# Patient Record
Sex: Female | Born: 1975
Health system: Southern US, Community
[De-identification: ages and names within clinical notes are randomized; demographics above are authoritative.]

## PROBLEM LIST (undated history)

## (undated) DIAGNOSIS — J302 Other seasonal allergic rhinitis: Secondary | ICD-10-CM

## (undated) DIAGNOSIS — I10 Essential (primary) hypertension: Secondary | ICD-10-CM

## (undated) DIAGNOSIS — F32A Depression, unspecified: Secondary | ICD-10-CM

## (undated) DIAGNOSIS — E039 Hypothyroidism, unspecified: Secondary | ICD-10-CM

## (undated) DIAGNOSIS — F419 Anxiety disorder, unspecified: Secondary | ICD-10-CM

## (undated) DIAGNOSIS — K219 Gastro-esophageal reflux disease without esophagitis: Secondary | ICD-10-CM

## (undated) DIAGNOSIS — F329 Major depressive disorder, single episode, unspecified: Secondary | ICD-10-CM

## (undated) DIAGNOSIS — M549 Dorsalgia, unspecified: Secondary | ICD-10-CM

## (undated) DIAGNOSIS — M2550 Pain in unspecified joint: Secondary | ICD-10-CM

## (undated) DIAGNOSIS — R609 Edema, unspecified: Secondary | ICD-10-CM

## (undated) DIAGNOSIS — R7303 Prediabetes: Secondary | ICD-10-CM

## (undated) HISTORY — DX: Gastro-esophageal reflux disease without esophagitis: K21.9

## (undated) HISTORY — DX: Other seasonal allergic rhinitis: J30.2

## (undated) HISTORY — PX: COLONOSCOPY: SHX174

## (undated) HISTORY — DX: Depression, unspecified: F32.A

## (undated) HISTORY — DX: Hypothyroidism, unspecified: E03.9

## (undated) HISTORY — DX: Dorsalgia, unspecified: M54.9

## (undated) HISTORY — DX: Prediabetes: R73.03

## (undated) HISTORY — PX: NO PAST SURGERIES: SHX2092

## (undated) HISTORY — DX: Major depressive disorder, single episode, unspecified: F32.9

## (undated) HISTORY — DX: Edema, unspecified: R60.9

## (undated) HISTORY — DX: Essential (primary) hypertension: I10

## (undated) HISTORY — DX: Pain in unspecified joint: M25.50

## (undated) HISTORY — DX: Anxiety disorder, unspecified: F41.9

---

## 2007-04-24 ENCOUNTER — Emergency Department (HOSPITAL_COMMUNITY): Admission: EM | Admit: 2007-04-24 | Discharge: 2007-04-24 | Payer: Self-pay | Admitting: Family Medicine

## 2008-12-03 ENCOUNTER — Ambulatory Visit: Payer: Self-pay | Admitting: Family Medicine

## 2008-12-03 DIAGNOSIS — M25569 Pain in unspecified knee: Secondary | ICD-10-CM | POA: Insufficient documentation

## 2008-12-03 DIAGNOSIS — IMO0002 Reserved for concepts with insufficient information to code with codable children: Secondary | ICD-10-CM | POA: Insufficient documentation

## 2008-12-03 DIAGNOSIS — M7750 Other enthesopathy of unspecified foot: Secondary | ICD-10-CM | POA: Insufficient documentation

## 2008-12-31 ENCOUNTER — Ambulatory Visit: Payer: Self-pay | Admitting: Family Medicine

## 2009-04-24 ENCOUNTER — Emergency Department (HOSPITAL_COMMUNITY): Admission: EM | Admit: 2009-04-24 | Discharge: 2009-04-24 | Payer: Self-pay | Admitting: Family Medicine

## 2009-05-24 ENCOUNTER — Other Ambulatory Visit: Admission: RE | Admit: 2009-05-24 | Discharge: 2009-05-24 | Payer: Self-pay | Admitting: Family Medicine

## 2010-08-17 ENCOUNTER — Other Ambulatory Visit: Payer: Self-pay | Admitting: Family Medicine

## 2010-08-17 ENCOUNTER — Other Ambulatory Visit (HOSPITAL_COMMUNITY)
Admission: RE | Admit: 2010-08-17 | Discharge: 2010-08-17 | Disposition: A | Discharge status: Home / Self Care | Payer: BC Managed Care – PPO | Source: Ambulatory Visit | Attending: Family Medicine | Admitting: Family Medicine

## 2010-08-17 DIAGNOSIS — Z124 Encounter for screening for malignant neoplasm of cervix: Secondary | ICD-10-CM | POA: Insufficient documentation

## 2010-08-17 DIAGNOSIS — Z113 Encounter for screening for infections with a predominantly sexual mode of transmission: Secondary | ICD-10-CM | POA: Insufficient documentation

## 2010-09-13 ENCOUNTER — Inpatient Hospital Stay (INDEPENDENT_AMBULATORY_CARE_PROVIDER_SITE_OTHER)
Admission: RE | Admit: 2010-09-13 | Discharge: 2010-09-13 | Disposition: A | Discharge status: Home / Self Care | Payer: BC Managed Care – PPO | Source: Ambulatory Visit | Attending: Emergency Medicine | Admitting: Emergency Medicine

## 2010-09-13 DIAGNOSIS — K5289 Other specified noninfective gastroenteritis and colitis: Secondary | ICD-10-CM

## 2010-09-13 LAB — POCT I-STAT, CHEM 8
Calcium, Ion: 1.17 mmol/L (ref 1.12–1.32)
Chloride: 101 mEq/L (ref 96–112)
HCT: 42 % (ref 36.0–46.0)
Sodium: 140 mEq/L (ref 135–145)

## 2010-09-13 LAB — POCT PREGNANCY, URINE: Preg Test, Ur: NEGATIVE

## 2010-09-13 LAB — OCCULT BLOOD, POC DEVICE: Fecal Occult Bld: NEGATIVE

## 2010-09-13 LAB — POCT URINALYSIS DIP (DEVICE)
Glucose, UA: NEGATIVE mg/dL
Ketones, ur: 15 mg/dL — AB
Specific Gravity, Urine: 1.02 (ref 1.005–1.030)
Urobilinogen, UA: 2 mg/dL — ABNORMAL HIGH (ref 0.0–1.0)

## 2010-12-01 LAB — INFLUENZA A AND B ANTIGEN (CONVERTED LAB): Inflenza A Ag: NEGATIVE

## 2010-12-01 LAB — POCT RAPID STREP A: Streptococcus, Group A Screen (Direct): NEGATIVE

## 2012-10-20 ENCOUNTER — Other Ambulatory Visit: Payer: Self-pay | Admitting: *Deleted

## 2012-10-20 DIAGNOSIS — Z3490 Encounter for supervision of normal pregnancy, unspecified, unspecified trimester: Secondary | ICD-10-CM

## 2012-10-23 ENCOUNTER — Ambulatory Visit (HOSPITAL_COMMUNITY)
Admission: RE | Admit: 2012-10-23 | Discharge: 2012-10-23 | Disposition: A | Discharge status: Home / Self Care | Payer: BC Managed Care – PPO | Source: Ambulatory Visit | Attending: Obstetrics & Gynecology | Admitting: Obstetrics & Gynecology

## 2012-10-23 ENCOUNTER — Other Ambulatory Visit: Payer: BC Managed Care – PPO | Admitting: *Deleted

## 2012-10-23 DIAGNOSIS — O209 Hemorrhage in early pregnancy, unspecified: Secondary | ICD-10-CM | POA: Insufficient documentation

## 2012-10-23 DIAGNOSIS — Z3490 Encounter for supervision of normal pregnancy, unspecified, unspecified trimester: Secondary | ICD-10-CM

## 2012-10-23 DIAGNOSIS — Z3201 Encounter for pregnancy test, result positive: Secondary | ICD-10-CM

## 2012-10-23 LAB — OB RESULTS CONSOLE HGB/HCT, BLOOD: HCT: 36 %

## 2012-10-23 LAB — OB RESULTS CONSOLE RPR: RPR: NONREACTIVE

## 2012-10-24 ENCOUNTER — Encounter: Payer: Self-pay | Admitting: Obstetrics & Gynecology

## 2012-10-24 DIAGNOSIS — D259 Leiomyoma of uterus, unspecified: Secondary | ICD-10-CM | POA: Insufficient documentation

## 2012-10-24 DIAGNOSIS — IMO0002 Reserved for concepts with insufficient information to code with codable children: Secondary | ICD-10-CM | POA: Insufficient documentation

## 2012-10-24 LAB — OBSTETRIC PANEL
Eosinophils Absolute: 0 10*3/uL (ref 0.0–0.7)
HCT: 36.2 % (ref 36.0–46.0)
Hepatitis B Surface Ag: NEGATIVE
Lymphocytes Relative: 16 % (ref 12–46)
Lymphs Abs: 1.6 10*3/uL (ref 0.7–4.0)
MCHC: 33.4 g/dL (ref 30.0–36.0)
MCV: 87.9 fL (ref 78.0–100.0)
Monocytes Relative: 6 % (ref 3–12)
Neutrophils Relative %: 78 % — ABNORMAL HIGH (ref 43–77)
Platelets: 336 10*3/uL (ref 150–400)
Rh Type: POSITIVE
Rubella: 10.6 Index — ABNORMAL HIGH (ref <0.90)

## 2012-10-24 LAB — POCT PREGNANCY, URINE: Preg Test, Ur: POSITIVE — AB

## 2012-10-24 LAB — CULTURE, OB URINE
Colony Count: NO GROWTH
Organism ID, Bacteria: NO GROWTH

## 2012-10-27 LAB — HEMOGLOBINOPATHY EVALUATION
Hemoglobin Other: 0 %
Hgb S Quant: 0 %

## 2012-10-30 ENCOUNTER — Ambulatory Visit (INDEPENDENT_AMBULATORY_CARE_PROVIDER_SITE_OTHER): Payer: BC Managed Care – PPO | Admitting: Obstetrics & Gynecology

## 2012-10-30 ENCOUNTER — Encounter: Payer: Self-pay | Admitting: Obstetrics & Gynecology

## 2012-10-30 DIAGNOSIS — O99281 Endocrine, nutritional and metabolic diseases complicating pregnancy, first trimester: Secondary | ICD-10-CM

## 2012-10-30 DIAGNOSIS — O3410 Maternal care for benign tumor of corpus uteri, unspecified trimester: Secondary | ICD-10-CM

## 2012-10-30 DIAGNOSIS — O169 Unspecified maternal hypertension, unspecified trimester: Secondary | ICD-10-CM

## 2012-10-30 DIAGNOSIS — D259 Leiomyoma of uterus, unspecified: Secondary | ICD-10-CM

## 2012-10-30 DIAGNOSIS — O10019 Pre-existing essential hypertension complicating pregnancy, unspecified trimester: Secondary | ICD-10-CM

## 2012-10-30 DIAGNOSIS — O10911 Unspecified pre-existing hypertension complicating pregnancy, first trimester: Secondary | ICD-10-CM

## 2012-10-30 DIAGNOSIS — E079 Disorder of thyroid, unspecified: Secondary | ICD-10-CM

## 2012-10-30 DIAGNOSIS — O10919 Unspecified pre-existing hypertension complicating pregnancy, unspecified trimester: Secondary | ICD-10-CM | POA: Insufficient documentation

## 2012-10-30 DIAGNOSIS — O10011 Pre-existing essential hypertension complicating pregnancy, first trimester: Secondary | ICD-10-CM

## 2012-10-30 DIAGNOSIS — E039 Hypothyroidism, unspecified: Secondary | ICD-10-CM

## 2012-10-30 DIAGNOSIS — Z3491 Encounter for supervision of normal pregnancy, unspecified, first trimester: Secondary | ICD-10-CM

## 2012-10-30 LAB — POCT URINALYSIS DIP (DEVICE)
Bilirubin Urine: NEGATIVE
Ketones, ur: NEGATIVE mg/dL
Protein, ur: NEGATIVE mg/dL
Specific Gravity, Urine: 1.02 (ref 1.005–1.030)
pH: 8.5 — ABNORMAL HIGH (ref 5.0–8.0)

## 2012-10-30 MED ORDER — PRENATAL VITAMINS PLUS 27-1 MG PO TABS: 1.0000 | ORAL_TABLET | Freq: Every day | ORAL | Status: DC

## 2012-10-30 NOTE — Progress Notes (Addendum)
Nutrition note: 1st visit consult Pt has gained 3# @ [redacted]w[redacted]d, which is wnl.  Pt reports eating 4x/d. Pt is not taking PNV yet. Pt reports N/V daily in the morning and heartburn throughout the day. Pt is not physically active. Pt received verbal and written education on general nutrition during pregnancy. Discussed tips to decrease N/V and heartburn. Encouraged pt to take PNV daily. Discussed wt gain goals of 15-25# or 0.6#/wk during 2nd & 3rd trimester.  Pt agrees to start taking PNV soon. Pt does not have WIC. Pt plans to BF. F/u if referred Blondell Reveal, MS, RD, LDN

## 2012-10-30 NOTE — Patient Instructions (Signed)
Pregnancy - First Trimester  During sexual intercourse, millions of sperm go into the vagina. Only 1 sperm will penetrate and fertilize the female egg while it is in the Fallopian tube. One week later, the fertilized egg implants into the wall of the uterus. An embryo begins to develop into a baby. At 6 to 8 weeks, the eyes and face are formed and the heartbeat can be seen on ultrasound. At the end of 12 weeks (first trimester), all the baby's organs are formed. Now that you are pregnant, you will want to do everything you can to have a healthy baby. Two of the most important things are to get good prenatal care and follow your caregiver's instructions. Prenatal care is all the medical care you receive before the baby's birth. It is given to prevent, find, and treat problems during the pregnancy and childbirth.  PRENATAL EXAMS  · During prenatal visits, your weight, blood pressure, and urine are checked. This is done to make sure you are healthy and progressing normally during the pregnancy.  · A pregnant woman should gain 25 to 35 pounds during the pregnancy. However, if you are overweight or underweight, your caregiver will advise you regarding your weight.  · Your caregiver will ask and answer questions for you.  · Blood work, cervical cultures, other necessary tests, and a Pap test are done during your prenatal exams. These tests are done to check on your health and the probable health of your baby. Tests are strongly recommended and done for HIV with your permission. This is the virus that causes AIDS. These tests are done because medicines can be given to help prevent your baby from being born with this infection should you have been infected without knowing it. Blood work is also used to find out your blood type, previous infections, and follow your blood levels (hemoglobin).  · Low hemoglobin (anemia) is common during pregnancy. Iron and vitamins are given to help prevent this. Later in the pregnancy, blood  tests for diabetes will be done along with any other tests if any problems develop.  · You may need other tests to make sure you and the baby are doing well.  CHANGES DURING THE FIRST TRIMESTER   Your body goes through many changes during pregnancy. They vary from person to person. Talk to your caregiver about changes you notice and are concerned about. Changes can include:  · Your menstrual period stops.  · The egg and sperm carry the genes that determine what you look like. Genes from you and your partner are forming a baby. The female genes determine whether the baby is a boy or a girl.  · Your body increases in girth and you may feel bloated.  · Feeling sick to your stomach (nauseous) and throwing up (vomiting). If the vomiting is uncontrollable, call your caregiver.  · Your breasts will begin to enlarge and become tender.  · Your nipples may stick out more and become darker.  · The need to urinate more. Painful urination may mean you have a bladder infection.  · Tiring easily.  · Loss of appetite.  · Cravings for certain kinds of food.  · At first, you may gain or lose a couple of pounds.  · You may have changes in your emotions from day to day (excited to be pregnant or concerned something may go wrong with the pregnancy and baby).  · You may have more vivid and strange dreams.  HOME CARE INSTRUCTIONS   ·   It is very important to avoid all smoking, alcohol and non-prescribed drugs during your pregnancy. These affect the formation and growth of the baby. Avoid chemicals while pregnant to ensure the delivery of a healthy infant.  · Start your prenatal visits by the 12th week of pregnancy. They are usually scheduled monthly at first, then more often in the last 2 months before delivery. Keep your caregiver's appointments. Follow your caregiver's instructions regarding medicine use, blood and lab tests, exercise, and diet.  · During pregnancy, you are providing food for you and your baby. Eat regular, well-balanced  meals. Choose foods such as meat, fish, milk and other low fat dairy products, vegetables, fruits, and whole-grain breads and cereals. Your caregiver will tell you of the ideal weight gain.  · You can help morning sickness by keeping soda crackers at the bedside. Eat a couple before arising in the morning. You may want to use the crackers without salt on them.  · Eating 4 to 5 small meals rather than 3 large meals a day also may help the nausea and vomiting.  · Drinking liquids between meals instead of during meals also seems to help nausea and vomiting.  · A physical sexual relationship may be continued throughout pregnancy if there are no other problems. Problems may be early (premature) leaking of amniotic fluid from the membranes, vaginal bleeding, or belly (abdominal) pain.  · Exercise regularly if there are no restrictions. Check with your caregiver or physical therapist if you are unsure of the safety of some of your exercises. Greater weight gain will occur in the last 2 trimesters of pregnancy. Exercising will help:  · Control your weight.  · Keep you in shape.  · Prepare you for labor and delivery.  · Help you lose your pregnancy weight after you deliver your baby.  · Wear a good support or jogging bra for breast tenderness during pregnancy. This may help if worn during sleep too.  · Ask when prenatal classes are available. Begin classes when they are offered.  · Do not use hot tubs, steam rooms, or saunas.  · Wear your seat belt when driving. This protects you and your baby if you are in an accident.  · Avoid raw meat, uncooked cheese, cat litter boxes, and soil used by cats throughout the pregnancy. These carry germs that can cause birth defects in the baby.  · The first trimester is a good time to visit your dentist for your dental health. Getting your teeth cleaned is okay. Use a softer toothbrush and brush gently during pregnancy.  · Ask for help if you have financial, counseling, or nutritional needs  during pregnancy. Your caregiver will be able to offer counseling for these needs as well as refer you for other special needs.  · Do not take any medicines or herbs unless told by your caregiver.  · Inform your caregiver if there is any mental or physical domestic violence.  · Make a list of emergency phone numbers of family, friends, hospital, and police and fire departments.  · Write down your questions. Take them to your prenatal visit.  · Do not douche.  · Do not cross your legs.  · If you have to stand for long periods of time, rotate you feet or take small steps in a circle.  · You may have more vaginal secretions that may require a sanitary pad. Do not use tampons or scented sanitary pads.  MEDICINES AND DRUG USE IN PREGNANCY  ·   Take prenatal vitamins as directed. The vitamin should contain 1 milligram of folic acid. Keep all vitamins out of reach of children. Only a couple vitamins or tablets containing iron may be fatal to a baby or young child when ingested.  · Avoid use of all medicines, including herbs, over-the-counter medicines, not prescribed or suggested by your caregiver. Only take over-the-counter or prescription medicines for pain, discomfort, or fever as directed by your caregiver. Do not use aspirin, ibuprofen, or naproxen unless directed by your caregiver.  · Let your caregiver also know about herbs you may be using.  · Alcohol is related to a number of birth defects. This includes fetal alcohol syndrome. All alcohol, in any form, should be avoided completely. Smoking will cause low birth rate and premature babies.  · Street or illegal drugs are very harmful to the baby. They are absolutely forbidden. A baby born to an addicted mother will be addicted at birth. The baby will go through the same withdrawal an adult does.  · Let your caregiver know about any medicines that you have to take and for what reason you take them.  SEEK MEDICAL CARE IF:   You have any concerns or worries during your  pregnancy. It is better to call with your questions if you feel they cannot wait, rather than worry about them.  SEEK IMMEDIATE MEDICAL CARE IF:   · An unexplained oral temperature above 102° F (38.9° C) develops, or as your caregiver suggests.  · You have leaking of fluid from the vagina (birth canal). If leaking membranes are suspected, take your temperature and inform your caregiver of this when you call.  · There is vaginal spotting or bleeding. Notify your caregiver of the amount and how many pads are used.  · You develop a bad smelling vaginal discharge with a change in the color.  · You continue to feel sick to your stomach (nauseated) and have no relief from remedies suggested. You vomit blood or coffee ground-like materials.  · You lose more than 2 pounds of weight in 1 week.  · You gain more than 2 pounds of weight in 1 week and you notice swelling of your face, hands, feet, or legs.  · You gain 5 pounds or more in 1 week (even if you do not have swelling of your hands, face, legs, or feet).  · You get exposed to German measles and have never had them.  · You are exposed to fifth disease or chickenpox.  · You develop belly (abdominal) pain. Round ligament discomfort is a common non-cancerous (benign) cause of abdominal pain in pregnancy. Your caregiver still must evaluate this.  · You develop headache, fever, diarrhea, pain with urination, or shortness of breath.  · You fall or are in a car accident or have any kind of trauma.  · There is mental or physical violence in your home.  Document Released: 02/20/2001 Document Revised: 11/21/2011 Document Reviewed: 08/24/2008  [employer]® Patient Information ©2014 [employer].

## 2012-10-30 NOTE — Progress Notes (Signed)
P=86,    Here for new ob .  Given new patient information. C/o intermittent dull pain right side of abdomen. Had one episode of bright red blood before had her ultrasound about 2 weeks ago.

## 2012-10-30 NOTE — Progress Notes (Signed)
   Subjective:    ELYSSE Duncan is a G2P0010 [redacted]w[redacted]d being seen today for her first obstetrical visit.  Her obstetrical history is significant for advanced maternal age and chronic Htn, hypothyroid, . Patient does intend to breast feed. Pregnancy history fully reviewed.  Patient reports fatigue, heartburn, no bleeding, no contractions and no cramping.  Filed Vitals:   10/30/12 0824 10/30/12 0828 10/30/12 0832  BP: 160/98  140/98  Temp: 97.4 F (36.3 C)    Height:  5\' 1"  (1.549 m)   Weight: 149 lb (67.586 kg)      HISTORY: OB History  Gravida Para Term Preterm AB SAB TAB Ectopic Multiple Living  2    1  1        # Outcome Date GA Lbr Len/2nd Weight Sex Delivery Anes PTL Lv  2 CUR           1 TAB 2000             Past Medical History  Diagnosis Date  . Depression   . Hypothyroidism   . GERD (gastroesophageal reflux disease)   . Hypertension    History reviewed. No pertinent past surgical history. Family History  Problem Relation Age of Onset  . Hypertension Mother   . Hypothyroidism Mother   . Arthritis Mother   . COPD Father      Exam    Uterus:     Pelvic Exam:    Perineum: No Hemorrhoids   Vulva: normal   Vagina:  normal mucosa, bleeding after pap smear   pH: n/a   Cervix: no lesions and nulliparous appearance   Adnexa: normal adnexa and no mass, fullness, tenderness   Bony Pelvis: average  System: Breast:  normal appearance, no masses or tenderness, yellowish discharge from right breast c/w colostrum   Skin: normal coloration and turgor, no rashes    Neurologic: oriented, normal mood   Extremities: no erythema, induration, or nodules   HEENT sclera clear, anicteric and gums red--needs to floss   Mouth/Teeth mucous membranes moist, pharynx normal without lesions and gums red, need to floss   Neck supple and no masses   Cardiovascular: regular rate and rhythm   Respiratory:  chest clear, no wheezing, crepitations, rhonchi, normal symmetric air entry   Abdomen: soft, nontender   Urinary: urethral meatus normal      Assessment:    Pregnancy: G2P0010 Patient Active Problem List   Diagnosis Date Noted  . Supervision of normal pregnancy 10/24/2012  . Uterine fibroids affecting pregnancy, antepartum 10/24/2012  . PATELLO-FEMORAL SYNDROME 12/03/2008  . ANSERINE BURSITIS, LEFT 12/03/2008  . METATARSALGIA 12/03/2008        Plan:     Initial labs drawn. Prenatal vitamins. Problem list reviewed and updated. Genetic Screening discussed -- pt wantst to think.  Pt offered Panorama.  Ultrasound discussed; fetal survey: requested.  Will complete near 20 weeks Nutrition today Early GCT HTN--24 hour urine and CMP, CBC  Hypothyroid--check TSH today.  Pt had levels checked 6 weeks ago after dose change.  (Sees Dr. Luciana Axe at [place])   Follow up in 4 weeks.    Karen Duncan H. 10/30/2012 See initial prenatal visit.  Documented IUP on [place] earlier this month.  Will get FH at next visit.

## 2012-10-30 NOTE — Addendum Note (Signed)
Addended by: Faythe Casa on: 10/30/2012 10:10 AM   Modules accepted: Orders

## 2012-10-31 LAB — COMPREHENSIVE METABOLIC PANEL
ALT: 13 U/L (ref 0–35)
CO2: 26 mEq/L (ref 19–32)
Calcium: 10.1 mg/dL (ref 8.4–10.5)
Chloride: 101 mEq/L (ref 96–112)
Creat: 0.69 mg/dL (ref 0.50–1.10)
Glucose, Bld: 94 mg/dL (ref 70–99)
Total Bilirubin: 0.4 mg/dL (ref 0.3–1.2)
Total Protein: 7.4 g/dL (ref 6.0–8.3)

## 2012-10-31 LAB — TSH: TSH: 5.728 u[IU]/mL — ABNORMAL HIGH (ref 0.350–4.500)

## 2012-11-01 ENCOUNTER — Other Ambulatory Visit: Payer: Self-pay | Admitting: Obstetrics & Gynecology

## 2012-11-01 DIAGNOSIS — E039 Hypothyroidism, unspecified: Secondary | ICD-10-CM

## 2012-11-01 LAB — GLUCOSE TOLERANCE, 1 HOUR (50G) W/O FASTING: Glucose, 1 Hour GTT: 92 mg/dL (ref 70–140)

## 2012-11-01 LAB — GLUCOSE TOLERANCE, 1 HOUR

## 2012-11-01 NOTE — Progress Notes (Signed)
Elevated TSH.  Draw Free T3 and T4

## 2012-11-03 ENCOUNTER — Other Ambulatory Visit: Payer: BC Managed Care – PPO

## 2012-11-03 DIAGNOSIS — E039 Hypothyroidism, unspecified: Secondary | ICD-10-CM

## 2012-11-03 DIAGNOSIS — O161 Unspecified maternal hypertension, first trimester: Secondary | ICD-10-CM

## 2012-11-03 LAB — CBC
MCH: 30.3 pg (ref 26.0–34.0)
MCV: 88.9 fL (ref 78.0–100.0)
Platelets: 361 10*3/uL (ref 150–400)
RDW: 13.1 % (ref 11.5–15.5)

## 2012-11-03 LAB — COMPREHENSIVE METABOLIC PANEL
ALT: 21 U/L (ref 0–35)
Albumin: 4.2 g/dL (ref 3.5–5.2)
Alkaline Phosphatase: 37 U/L — ABNORMAL LOW (ref 39–117)
CO2: 27 mEq/L (ref 19–32)
Potassium: 4.3 mEq/L (ref 3.5–5.3)
Sodium: 133 mEq/L — ABNORMAL LOW (ref 135–145)
Total Bilirubin: 0.3 mg/dL (ref 0.3–1.2)
Total Protein: 7.2 g/dL (ref 6.0–8.3)

## 2012-11-03 NOTE — Progress Notes (Signed)
Pt came in today 11/03/12 to drop off 24hr urine and lab draw will attempt to add the T3 and T4 along with the labs today.

## 2012-11-04 ENCOUNTER — Encounter: Payer: Self-pay | Admitting: Obstetrics & Gynecology

## 2012-11-04 MED ORDER — OMEPRAZOLE 40 MG PO CPDR: 40.0000 mg | DELAYED_RELEASE_CAPSULE | Freq: Every day | ORAL | Status: DC

## 2012-11-07 ENCOUNTER — Other Ambulatory Visit: Payer: Self-pay

## 2012-11-07 DIAGNOSIS — O99281 Endocrine, nutritional and metabolic diseases complicating pregnancy, first trimester: Secondary | ICD-10-CM

## 2012-11-07 DIAGNOSIS — D259 Leiomyoma of uterus, unspecified: Secondary | ICD-10-CM

## 2012-11-07 DIAGNOSIS — Z3491 Encounter for supervision of normal pregnancy, unspecified, first trimester: Secondary | ICD-10-CM

## 2012-11-07 DIAGNOSIS — E039 Hypothyroidism, unspecified: Secondary | ICD-10-CM

## 2012-11-07 DIAGNOSIS — O10911 Unspecified pre-existing hypertension complicating pregnancy, first trimester: Secondary | ICD-10-CM

## 2012-11-07 DIAGNOSIS — O10011 Pre-existing essential hypertension complicating pregnancy, first trimester: Secondary | ICD-10-CM

## 2012-11-07 MED ORDER — LEVOTHYROXINE SODIUM 88 MCG PO TABS: 88.0000 ug | ORAL_TABLET | Freq: Every day | ORAL | Status: DC

## 2012-11-07 NOTE — Telephone Encounter (Signed)
Refill on her synthyroid

## 2012-11-11 ENCOUNTER — Other Ambulatory Visit: Payer: Self-pay

## 2012-11-11 MED ORDER — BUTALBITAL-APAP-CAFFEINE 50-500-40 MG PO TABS: 1.0000 | ORAL_TABLET | Freq: Four times a day (QID) | ORAL | Status: DC | PRN

## 2012-11-11 NOTE — Telephone Encounter (Signed)
Pt c/o of severe headaches and tylenol not working.  Per Dr. Macon Large pt can have esgic plus 1-2 tablets q 6 hours for headache management.  Informed pt that medication is at her pharmacy

## 2012-11-12 ENCOUNTER — Encounter: Payer: Self-pay | Admitting: *Deleted

## 2012-11-25 ENCOUNTER — Encounter: Payer: BC Managed Care – PPO | Admitting: Obstetrics & Gynecology

## 2012-11-27 ENCOUNTER — Ambulatory Visit (INDEPENDENT_AMBULATORY_CARE_PROVIDER_SITE_OTHER): Payer: BC Managed Care – PPO | Admitting: Obstetrics & Gynecology

## 2012-11-27 VITALS — BP 125/80 | Temp 98.1°F | Wt 148.2 lb

## 2012-11-27 DIAGNOSIS — E039 Hypothyroidism, unspecified: Secondary | ICD-10-CM

## 2012-11-27 DIAGNOSIS — O169 Unspecified maternal hypertension, unspecified trimester: Secondary | ICD-10-CM

## 2012-11-27 DIAGNOSIS — E079 Disorder of thyroid, unspecified: Secondary | ICD-10-CM

## 2012-11-27 DIAGNOSIS — I10 Essential (primary) hypertension: Secondary | ICD-10-CM | POA: Insufficient documentation

## 2012-11-27 DIAGNOSIS — O10911 Unspecified pre-existing hypertension complicating pregnancy, first trimester: Secondary | ICD-10-CM

## 2012-11-27 DIAGNOSIS — D259 Leiomyoma of uterus, unspecified: Secondary | ICD-10-CM

## 2012-11-27 DIAGNOSIS — O09521 Supervision of elderly multigravida, first trimester: Secondary | ICD-10-CM

## 2012-11-27 DIAGNOSIS — O99281 Endocrine, nutritional and metabolic diseases complicating pregnancy, first trimester: Secondary | ICD-10-CM

## 2012-11-27 DIAGNOSIS — O09529 Supervision of elderly multigravida, unspecified trimester: Secondary | ICD-10-CM

## 2012-11-27 DIAGNOSIS — K219 Gastro-esophageal reflux disease without esophagitis: Secondary | ICD-10-CM | POA: Insufficient documentation

## 2012-11-27 DIAGNOSIS — F32A Depression, unspecified: Secondary | ICD-10-CM | POA: Insufficient documentation

## 2012-11-27 DIAGNOSIS — F329 Major depressive disorder, single episode, unspecified: Secondary | ICD-10-CM | POA: Insufficient documentation

## 2012-11-27 DIAGNOSIS — O3410 Maternal care for benign tumor of corpus uteri, unspecified trimester: Secondary | ICD-10-CM

## 2012-11-27 LAB — POCT URINALYSIS DIP (DEVICE)
Hgb urine dipstick: NEGATIVE
Nitrite: NEGATIVE
Protein, ur: NEGATIVE mg/dL
Urobilinogen, UA: 1 mg/dL (ref 0.0–1.0)
pH: 7 (ref 5.0–8.0)

## 2012-11-27 NOTE — Patient Instructions (Signed)
Return to clinic for any obstetric concerns or go to [facility] for evaluation  

## 2012-11-27 NOTE — Progress Notes (Signed)
Discussed genetic screening modalities in detail and increased risk of genetic anomalies due to AMA, patient declines all for now.  RLQ pain is consistent with round ligament pain, discussed normal/abnormal aches and pains of pregnancy.  No other complaints or concerns.  Routine obstetric precautions reviewed.

## 2012-11-27 NOTE — Progress Notes (Signed)
Pulse- 81 Patient reports RLQ pain

## 2012-12-25 ENCOUNTER — Encounter: Payer: Self-pay | Admitting: Family Medicine

## 2012-12-25 ENCOUNTER — Ambulatory Visit (INDEPENDENT_AMBULATORY_CARE_PROVIDER_SITE_OTHER): Payer: BC Managed Care – PPO | Admitting: Family Medicine

## 2012-12-25 VITALS — BP 135/77 | Temp 96.7°F | Wt 148.7 lb

## 2012-12-25 DIAGNOSIS — O09529 Supervision of elderly multigravida, unspecified trimester: Secondary | ICD-10-CM

## 2012-12-25 DIAGNOSIS — E039 Hypothyroidism, unspecified: Secondary | ICD-10-CM

## 2012-12-25 DIAGNOSIS — E079 Disorder of thyroid, unspecified: Secondary | ICD-10-CM

## 2012-12-25 DIAGNOSIS — O99281 Endocrine, nutritional and metabolic diseases complicating pregnancy, first trimester: Secondary | ICD-10-CM

## 2012-12-25 DIAGNOSIS — O169 Unspecified maternal hypertension, unspecified trimester: Secondary | ICD-10-CM

## 2012-12-25 DIAGNOSIS — O10911 Unspecified pre-existing hypertension complicating pregnancy, first trimester: Secondary | ICD-10-CM

## 2012-12-25 LAB — POCT URINALYSIS DIP (DEVICE)
Ketones, ur: NEGATIVE mg/dL
Nitrite: NEGATIVE
Protein, ur: NEGATIVE mg/dL
Urobilinogen, UA: 0.2 mg/dL (ref 0.0–1.0)
pH: 7.5 (ref 5.0–8.0)

## 2012-12-25 NOTE — Patient Instructions (Signed)
Pregnancy - First Trimester  During sexual intercourse, millions of sperm go into the vagina. Only 1 sperm will penetrate and fertilize the female egg while it is in the Fallopian tube. One week later, the fertilized egg implants into the wall of the uterus. An embryo begins to develop into a baby. At 6 to 8 weeks, the eyes and face are formed and the heartbeat can be seen on ultrasound. At the end of 12 weeks (first trimester), all the baby's organs are formed. Now that you are pregnant, you will want to do everything you can to have a healthy baby. Two of the most important things are to get good prenatal care and follow your caregiver's instructions. Prenatal care is all the medical care you receive before the baby's birth. It is given to prevent, find, and treat problems during the pregnancy and childbirth.  PRENATAL EXAMS  · During prenatal visits, your weight, blood pressure, and urine are checked. This is done to make sure you are healthy and progressing normally during the pregnancy.  · A pregnant woman should gain 25 to 35 pounds during the pregnancy. However, if you are overweight or underweight, your caregiver will advise you regarding your weight.  · Your caregiver will ask and answer questions for you.  · Blood work, cervical cultures, other necessary tests, and a Pap test are done during your prenatal exams. These tests are done to check on your health and the probable health of your baby. Tests are strongly recommended and done for HIV with your permission. This is the virus that causes AIDS. These tests are done because medicines can be given to help prevent your baby from being born with this infection should you have been infected without knowing it. Blood work is also used to find out your blood type, previous infections, and follow your blood levels (hemoglobin).  · Low hemoglobin (anemia) is common during pregnancy. Iron and vitamins are given to help prevent this. Later in the pregnancy, blood  tests for diabetes will be done along with any other tests if any problems develop.  · You may need other tests to make sure you and the baby are doing well.  CHANGES DURING THE FIRST TRIMESTER   Your body goes through many changes during pregnancy. They vary from person to person. Talk to your caregiver about changes you notice and are concerned about. Changes can include:  · Your menstrual period stops.  · The egg and sperm carry the genes that determine what you look like. Genes from you and your partner are forming a baby. The female genes determine whether the baby is a boy or a girl.  · Your body increases in girth and you may feel bloated.  · Feeling sick to your stomach (nauseous) and throwing up (vomiting). If the vomiting is uncontrollable, call your caregiver.  · Your breasts will begin to enlarge and become tender.  · Your nipples may stick out more and become darker.  · The need to urinate more. Painful urination may mean you have a bladder infection.  · Tiring easily.  · Loss of appetite.  · Cravings for certain kinds of food.  · At first, you may gain or lose a couple of pounds.  · You may have changes in your emotions from day to day (excited to be pregnant or concerned something may go wrong with the pregnancy and baby).  · You may have more vivid and strange dreams.  HOME CARE INSTRUCTIONS   ·   It is very important to avoid all smoking, alcohol and non-prescribed drugs during your pregnancy. These affect the formation and growth of the baby. Avoid chemicals while pregnant to ensure the delivery of a healthy infant.  · Start your prenatal visits by the 12th week of pregnancy. They are usually scheduled monthly at first, then more often in the last 2 months before delivery. Keep your caregiver's appointments. Follow your caregiver's instructions regarding medicine use, blood and lab tests, exercise, and diet.  · During pregnancy, you are providing food for you and your baby. Eat regular, well-balanced  meals. Choose foods such as meat, fish, milk and other low fat dairy products, vegetables, fruits, and whole-grain breads and cereals. Your caregiver will tell you of the ideal weight gain.  · You can help morning sickness by keeping soda crackers at the bedside. Eat a couple before arising in the morning. You may want to use the crackers without salt on them.  · Eating 4 to 5 small meals rather than 3 large meals a day also may help the nausea and vomiting.  · Drinking liquids between meals instead of during meals also seems to help nausea and vomiting.  · A physical sexual relationship may be continued throughout pregnancy if there are no other problems. Problems may be early (premature) leaking of amniotic fluid from the membranes, vaginal bleeding, or belly (abdominal) pain.  · Exercise regularly if there are no restrictions. Check with your caregiver or physical therapist if you are unsure of the safety of some of your exercises. Greater weight gain will occur in the last 2 trimesters of pregnancy. Exercising will help:  · Control your weight.  · Keep you in shape.  · Prepare you for labor and delivery.  · Help you lose your pregnancy weight after you deliver your baby.  · Wear a good support or jogging bra for breast tenderness during pregnancy. This may help if worn during sleep too.  · Ask when prenatal classes are available. Begin classes when they are offered.  · Do not use hot tubs, steam rooms, or saunas.  · Wear your seat belt when driving. This protects you and your baby if you are in an accident.  · Avoid raw meat, uncooked cheese, cat litter boxes, and soil used by cats throughout the pregnancy. These carry germs that can cause birth defects in the baby.  · The first trimester is a good time to visit your dentist for your dental health. Getting your teeth cleaned is okay. Use a softer toothbrush and brush gently during pregnancy.  · Ask for help if you have financial, counseling, or nutritional needs  during pregnancy. Your caregiver will be able to offer counseling for these needs as well as refer you for other special needs.  · Do not take any medicines or herbs unless told by your caregiver.  · Inform your caregiver if there is any mental or physical domestic violence.  · Make a list of emergency phone numbers of family, friends, hospital, and police and fire departments.  · Write down your questions. Take them to your prenatal visit.  · Do not douche.  · Do not cross your legs.  · If you have to stand for long periods of time, rotate you feet or take small steps in a circle.  · You may have more vaginal secretions that may require a sanitary pad. Do not use tampons or scented sanitary pads.  MEDICINES AND DRUG USE IN PREGNANCY  ·   Take prenatal vitamins as directed. The vitamin should contain 1 milligram of folic acid. Keep all vitamins out of reach of children. Only a couple vitamins or tablets containing iron may be fatal to a baby or young child when ingested.  · Avoid use of all medicines, including herbs, over-the-counter medicines, not prescribed or suggested by your caregiver. Only take over-the-counter or prescription medicines for pain, discomfort, or fever as directed by your caregiver. Do not use aspirin, ibuprofen, or naproxen unless directed by your caregiver.  · Let your caregiver also know about herbs you may be using.  · Alcohol is related to a number of birth defects. This includes fetal alcohol syndrome. All alcohol, in any form, should be avoided completely. Smoking will cause low birth rate and premature babies.  · Street or illegal drugs are very harmful to the baby. They are absolutely forbidden. A baby born to an addicted mother will be addicted at birth. The baby will go through the same withdrawal an adult does.  · Let your caregiver know about any medicines that you have to take and for what reason you take them.  SEEK MEDICAL CARE IF:   You have any concerns or worries during your  pregnancy. It is better to call with your questions if you feel they cannot wait, rather than worry about them.  SEEK IMMEDIATE MEDICAL CARE IF:   · An unexplained oral temperature above 102° F (38.9° C) develops, or as your caregiver suggests.  · You have leaking of fluid from the vagina (birth canal). If leaking membranes are suspected, take your temperature and inform your caregiver of this when you call.  · There is vaginal spotting or bleeding. Notify your caregiver of the amount and how many pads are used.  · You develop a bad smelling vaginal discharge with a change in the color.  · You continue to feel sick to your stomach (nauseated) and have no relief from remedies suggested. You vomit blood or coffee ground-like materials.  · You lose more than 2 pounds of weight in 1 week.  · You gain more than 2 pounds of weight in 1 week and you notice swelling of your face, hands, feet, or legs.  · You gain 5 pounds or more in 1 week (even if you do not have swelling of your hands, face, legs, or feet).  · You get exposed to German measles and have never had them.  · You are exposed to fifth disease or chickenpox.  · You develop belly (abdominal) pain. Round ligament discomfort is a common non-cancerous (benign) cause of abdominal pain in pregnancy. Your caregiver still must evaluate this.  · You develop headache, fever, diarrhea, pain with urination, or shortness of breath.  · You fall or are in a car accident or have any kind of trauma.  · There is mental or physical violence in your home.  Document Released: 02/20/2001 Document Revised: 11/21/2011 Document Reviewed: 08/24/2008  [employer]® Patient Information ©2014 [employer].

## 2012-12-25 NOTE — Progress Notes (Signed)
Pulse- 93    Pain-ligament 

## 2012-12-25 NOTE — Progress Notes (Signed)
Pt is 36 yo G2P0010 @ [redacted]w[redacted]d here for ROBV. Hx of HTN, hyperthyroidism and AMA.  - no ctx, vb,LOF - BP well controlled off medicine - thyroid WNL on synthroid  O;see flowsheet  A/p - doing well overall - will plan to schedule [place] at next visit - BP WNL - pt declines genetic testing.   - f/u in 4 weeks

## 2013-01-15 ENCOUNTER — Other Ambulatory Visit: Payer: Self-pay

## 2013-01-20 ENCOUNTER — Other Ambulatory Visit: Payer: Self-pay

## 2013-01-20 MED ORDER — CYCLOBENZAPRINE HCL 10 MG PO TABS: 10.0000 mg | ORAL_TABLET | Freq: Three times a day (TID) | ORAL | Status: DC | PRN

## 2013-01-20 NOTE — Telephone Encounter (Signed)
Pt c/o of severe back pain.  Pt stated that she was told from a provider that if she continues to have severe back pain then they would prescribe flexeril 10 mg.  Per Dr. Erin Fulling pt can have flexeril 10 mg q 6 hours PRN qty 30 no refill.  Called pt and informed her that she could pick up her medication and I verified pharmacy

## 2013-01-22 ENCOUNTER — Other Ambulatory Visit: Payer: Self-pay | Admitting: Obstetrics & Gynecology

## 2013-01-22 ENCOUNTER — Ambulatory Visit (HOSPITAL_COMMUNITY)
Admission: RE | Admit: 2013-01-22 | Discharge: 2013-01-22 | Disposition: A | Discharge status: Home / Self Care | Payer: BC Managed Care – PPO | Source: Ambulatory Visit | Attending: Obstetrics & Gynecology | Admitting: Obstetrics & Gynecology

## 2013-01-22 ENCOUNTER — Ambulatory Visit (INDEPENDENT_AMBULATORY_CARE_PROVIDER_SITE_OTHER): Payer: BC Managed Care – PPO | Admitting: Obstetrics & Gynecology

## 2013-01-22 VITALS — BP 122/76 | Temp 96.9°F | Wt 149.8 lb

## 2013-01-22 DIAGNOSIS — E079 Disorder of thyroid, unspecified: Secondary | ICD-10-CM | POA: Insufficient documentation

## 2013-01-22 DIAGNOSIS — O10019 Pre-existing essential hypertension complicating pregnancy, unspecified trimester: Secondary | ICD-10-CM | POA: Insufficient documentation

## 2013-01-22 DIAGNOSIS — Z23 Encounter for immunization: Secondary | ICD-10-CM

## 2013-01-22 DIAGNOSIS — O09522 Supervision of elderly multigravida, second trimester: Secondary | ICD-10-CM

## 2013-01-22 DIAGNOSIS — E039 Hypothyroidism, unspecified: Secondary | ICD-10-CM | POA: Insufficient documentation

## 2013-01-22 DIAGNOSIS — Z363 Encounter for antenatal screening for malformations: Secondary | ICD-10-CM | POA: Insufficient documentation

## 2013-01-22 DIAGNOSIS — O10912 Unspecified pre-existing hypertension complicating pregnancy, second trimester: Secondary | ICD-10-CM

## 2013-01-22 DIAGNOSIS — Z1389 Encounter for screening for other disorder: Secondary | ICD-10-CM | POA: Insufficient documentation

## 2013-01-22 DIAGNOSIS — O169 Unspecified maternal hypertension, unspecified trimester: Secondary | ICD-10-CM

## 2013-01-22 DIAGNOSIS — O358XX0 Maternal care for other (suspected) fetal abnormality and damage, not applicable or unspecified: Secondary | ICD-10-CM | POA: Insufficient documentation

## 2013-01-22 DIAGNOSIS — O09529 Supervision of elderly multigravida, unspecified trimester: Secondary | ICD-10-CM

## 2013-01-22 DIAGNOSIS — O3410 Maternal care for benign tumor of corpus uteri, unspecified trimester: Secondary | ICD-10-CM | POA: Insufficient documentation

## 2013-01-22 DIAGNOSIS — O09519 Supervision of elderly primigravida, unspecified trimester: Secondary | ICD-10-CM | POA: Insufficient documentation

## 2013-01-22 LAB — POCT URINALYSIS DIP (DEVICE)
Nitrite: NEGATIVE
Protein, ur: NEGATIVE mg/dL
Specific Gravity, Urine: 1.025 (ref 1.005–1.030)
Urobilinogen, UA: 0.2 mg/dL (ref 0.0–1.0)

## 2013-01-22 LAB — TSH: TSH: 3.309 u[IU]/mL (ref 0.350–4.500)

## 2013-01-22 LAB — T4, FREE: Free T4: 1.08 ng/dL (ref 0.80–1.80)

## 2013-01-22 LAB — T3, FREE: T3, Free: 2.8 pg/mL (ref 2.3–4.2)

## 2013-01-22 NOTE — Progress Notes (Signed)
Anatomy scan scheduled.  BP stable.   Flu shot today, will also recheck thyroid labs today. No other complaints or concerns.  Routine obstetric precautions reviewed.

## 2013-01-22 NOTE — Progress Notes (Signed)
Pulse- 75 Patient reports back pain

## 2013-01-22 NOTE — Progress Notes (Signed)
Patient to be worked in this morning for her anatomy U/S. Patient walked up at 820 am.

## 2013-01-22 NOTE — Patient Instructions (Signed)
Return to clinic for any obstetric concerns or go to [facility] for evaluation  

## 2013-01-28 ENCOUNTER — Encounter: Payer: Self-pay | Admitting: *Deleted

## 2013-02-02 ENCOUNTER — Other Ambulatory Visit: Payer: Self-pay

## 2013-02-02 DIAGNOSIS — O10011 Pre-existing essential hypertension complicating pregnancy, first trimester: Secondary | ICD-10-CM

## 2013-02-02 DIAGNOSIS — E039 Hypothyroidism, unspecified: Secondary | ICD-10-CM

## 2013-02-02 DIAGNOSIS — O10911 Unspecified pre-existing hypertension complicating pregnancy, first trimester: Secondary | ICD-10-CM

## 2013-02-02 DIAGNOSIS — D259 Leiomyoma of uterus, unspecified: Secondary | ICD-10-CM

## 2013-02-02 DIAGNOSIS — Z3491 Encounter for supervision of normal pregnancy, unspecified, first trimester: Secondary | ICD-10-CM

## 2013-02-02 DIAGNOSIS — O99281 Endocrine, nutritional and metabolic diseases complicating pregnancy, first trimester: Secondary | ICD-10-CM

## 2013-02-02 MED ORDER — LEVOTHYROXINE SODIUM 88 MCG PO TABS: 88.0000 ug | ORAL_TABLET | Freq: Every day | ORAL | Status: DC

## 2013-02-02 NOTE — Telephone Encounter (Signed)
Pt stated need a refill on her synthroid.  Per Dr. Macon Large, pt can have refill x 3 on her synthroid.  Notified patient of refill.

## 2013-02-19 ENCOUNTER — Encounter: Payer: Self-pay | Admitting: Obstetrics and Gynecology

## 2013-02-19 ENCOUNTER — Ambulatory Visit (INDEPENDENT_AMBULATORY_CARE_PROVIDER_SITE_OTHER): Payer: BC Managed Care – PPO | Admitting: Obstetrics and Gynecology

## 2013-02-19 VITALS — BP 120/77 | Temp 97.2°F | Wt 151.7 lb

## 2013-02-19 DIAGNOSIS — O09522 Supervision of elderly multigravida, second trimester: Secondary | ICD-10-CM

## 2013-02-19 DIAGNOSIS — O99281 Endocrine, nutritional and metabolic diseases complicating pregnancy, first trimester: Secondary | ICD-10-CM

## 2013-02-19 DIAGNOSIS — E079 Disorder of thyroid, unspecified: Secondary | ICD-10-CM

## 2013-02-19 DIAGNOSIS — I10 Essential (primary) hypertension: Secondary | ICD-10-CM

## 2013-02-19 DIAGNOSIS — O169 Unspecified maternal hypertension, unspecified trimester: Secondary | ICD-10-CM

## 2013-02-19 DIAGNOSIS — F329 Major depressive disorder, single episode, unspecified: Secondary | ICD-10-CM

## 2013-02-19 DIAGNOSIS — O3410 Maternal care for benign tumor of corpus uteri, unspecified trimester: Secondary | ICD-10-CM

## 2013-02-19 DIAGNOSIS — D259 Leiomyoma of uterus, unspecified: Secondary | ICD-10-CM

## 2013-02-19 DIAGNOSIS — O09529 Supervision of elderly multigravida, unspecified trimester: Secondary | ICD-10-CM

## 2013-02-19 DIAGNOSIS — O99340 Other mental disorders complicating pregnancy, unspecified trimester: Secondary | ICD-10-CM

## 2013-02-19 DIAGNOSIS — O10912 Unspecified pre-existing hypertension complicating pregnancy, second trimester: Secondary | ICD-10-CM

## 2013-02-19 DIAGNOSIS — N898 Other specified noninflammatory disorders of vagina: Secondary | ICD-10-CM

## 2013-02-19 DIAGNOSIS — F32A Depression, unspecified: Secondary | ICD-10-CM

## 2013-02-19 DIAGNOSIS — E039 Hypothyroidism, unspecified: Secondary | ICD-10-CM

## 2013-02-19 DIAGNOSIS — F3289 Other specified depressive episodes: Secondary | ICD-10-CM

## 2013-02-19 LAB — POCT URINALYSIS DIP (DEVICE)
Bilirubin Urine: NEGATIVE
Glucose, UA: NEGATIVE mg/dL
Hgb urine dipstick: NEGATIVE
Protein, ur: NEGATIVE mg/dL
Specific Gravity, Urine: 1.025 (ref 1.005–1.030)
Urobilinogen, UA: 1 mg/dL (ref 0.0–1.0)

## 2013-02-19 NOTE — Progress Notes (Signed)
P= 85 C/o of a lot of white vaginal discharge, itchy.  C/o of "alot of back pain", pt. States starts on left side of rib cage and radiates to back. States muscle relaxer doesn't help, but heating pad helps a little. "When the pain comes I cant even sit down, the only think that helps is to lay flat on my back."

## 2013-02-19 NOTE — Addendum Note (Signed)
Addended by: Franchot Mimes on: 02/19/2013 09:41 AM   Modules accepted: Orders

## 2013-02-19 NOTE — Progress Notes (Signed)
Patient doing well, describes musculoskeletal pain on left side of upper abdomen which radiates to back. Reproducible on exam. Advised to continue applying heat to the area, stretching exercises. Wet prep collected. 1hr GCT and labs next visit

## 2013-02-20 ENCOUNTER — Telehealth: Payer: Self-pay | Admitting: *Deleted

## 2013-02-20 LAB — WET PREP, GENITAL: Trich, Wet Prep: NONE SEEN

## 2013-02-20 MED ORDER — METRONIDAZOLE 500 MG PO TABS: 500.0000 mg | ORAL_TABLET | Freq: Two times a day (BID) | ORAL | Status: DC

## 2013-02-20 MED ORDER — FLUCONAZOLE 150 MG PO TABS: 150.0000 mg | ORAL_TABLET | Freq: Once | ORAL | Status: DC

## 2013-02-20 NOTE — Telephone Encounter (Signed)
Dr. Jolayne Panther requested that we call patient to inform her of +yeast and +bv and that rx was sent to pharmacy. Called patient and left message for her that we have results.

## 2013-02-20 NOTE — Addendum Note (Signed)
Addended by: Catalina Antigua on: 02/20/2013 08:23 AM   Modules accepted: Orders

## 2013-02-23 ENCOUNTER — Other Ambulatory Visit: Payer: Self-pay | Admitting: Obstetrics & Gynecology

## 2013-02-23 DIAGNOSIS — Z3491 Encounter for supervision of normal pregnancy, unspecified, first trimester: Secondary | ICD-10-CM

## 2013-02-23 DIAGNOSIS — O10911 Unspecified pre-existing hypertension complicating pregnancy, first trimester: Secondary | ICD-10-CM

## 2013-02-23 DIAGNOSIS — O99281 Endocrine, nutritional and metabolic diseases complicating pregnancy, first trimester: Secondary | ICD-10-CM

## 2013-02-23 DIAGNOSIS — D259 Leiomyoma of uterus, unspecified: Secondary | ICD-10-CM

## 2013-02-23 DIAGNOSIS — O10011 Pre-existing essential hypertension complicating pregnancy, first trimester: Secondary | ICD-10-CM

## 2013-02-23 DIAGNOSIS — E039 Hypothyroidism, unspecified: Secondary | ICD-10-CM

## 2013-02-23 MED ORDER — LEVOTHYROXINE SODIUM 88 MCG PO TABS: 88.0000 ug | ORAL_TABLET | Freq: Every day | ORAL | Status: DC

## 2013-02-23 MED ORDER — OMEPRAZOLE 40 MG PO CPDR: 40.0000 mg | DELAYED_RELEASE_CAPSULE | Freq: Every day | ORAL | Status: DC

## 2013-02-23 MED ORDER — SERTRALINE HCL 50 MG PO TABS: 50.0000 mg | ORAL_TABLET | Freq: Every day | ORAL | Status: DC

## 2013-02-23 NOTE — Progress Notes (Signed)
Medications refilled as per patient request

## 2013-03-12 NOTE — L&D Delivery Note (Signed)
Delivery Note At 11:50 AM a healthy female was delivered via Vaginal, Spontaneous Delivery (Presentation: ; Occiput Anterior).  APGAR: 8, 9; weight .   Placenta status: Intact, Spontaneous.  Cord: 3 vessels with the following complications: None.  Cord pH: not obtained  Anesthesia:   Episiotomy: None Lacerations: None Suture Repair: na Est. Blood Loss (mL): 250  Upon arrival patient was complete and pushing. She pushed with good maternal effort to deliver a healthy boy. Baby delivered without difficulty, was noted to have good tone and place on maternal abdomen for oral suctioning, drying and stimulation. Delayed cord clamping performed and cut by FOB. Placenta delivered intact with 3V cord. Vaginal canal and perineum was inspected and intact. Pitocin was started and uterus massaged until bleeding slowed. Counts of sharps, instruments, and lap pads were all correct.    Mom to postpartum.  Baby to Couplet care / Skin to Skin.  Phill Myron 06/07/2013, 12:49 PM

## 2013-03-12 NOTE — L&D Delivery Note (Signed)
Agree with assessment. 

## 2013-03-19 ENCOUNTER — Encounter: Payer: BC Managed Care – PPO | Admitting: Family

## 2013-03-26 ENCOUNTER — Ambulatory Visit (INDEPENDENT_AMBULATORY_CARE_PROVIDER_SITE_OTHER): Payer: BC Managed Care – PPO | Admitting: Family Medicine

## 2013-03-26 VITALS — BP 115/72 | Temp 96.3°F | Wt 154.1 lb

## 2013-03-26 DIAGNOSIS — Z23 Encounter for immunization: Secondary | ICD-10-CM

## 2013-03-26 DIAGNOSIS — O99280 Endocrine, nutritional and metabolic diseases complicating pregnancy, unspecified trimester: Secondary | ICD-10-CM

## 2013-03-26 DIAGNOSIS — I10 Essential (primary) hypertension: Secondary | ICD-10-CM

## 2013-03-26 DIAGNOSIS — O10019 Pre-existing essential hypertension complicating pregnancy, unspecified trimester: Secondary | ICD-10-CM

## 2013-03-26 DIAGNOSIS — E079 Disorder of thyroid, unspecified: Secondary | ICD-10-CM

## 2013-03-26 DIAGNOSIS — O10919 Unspecified pre-existing hypertension complicating pregnancy, unspecified trimester: Secondary | ICD-10-CM

## 2013-03-26 DIAGNOSIS — O09529 Supervision of elderly multigravida, unspecified trimester: Secondary | ICD-10-CM

## 2013-03-26 DIAGNOSIS — IMO0002 Reserved for concepts with insufficient information to code with codable children: Secondary | ICD-10-CM

## 2013-03-26 DIAGNOSIS — E039 Hypothyroidism, unspecified: Secondary | ICD-10-CM

## 2013-03-26 DIAGNOSIS — O99281 Endocrine, nutritional and metabolic diseases complicating pregnancy, first trimester: Secondary | ICD-10-CM

## 2013-03-26 LAB — CBC
HEMATOCRIT: 33 % — AB (ref 36.0–46.0)
Hemoglobin: 11.2 g/dL — ABNORMAL LOW (ref 12.0–15.0)
MCH: 30.5 pg (ref 26.0–34.0)
MCHC: 33.9 g/dL (ref 30.0–36.0)
MCV: 89.9 fL (ref 78.0–100.0)
PLATELETS: 245 10*3/uL (ref 150–400)
RBC: 3.67 MIL/uL — AB (ref 3.87–5.11)
RDW: 13.3 % (ref 11.5–15.5)
WBC: 6.7 10*3/uL (ref 4.0–10.5)

## 2013-03-26 LAB — POCT URINALYSIS DIP (DEVICE)
BILIRUBIN URINE: NEGATIVE
Glucose, UA: NEGATIVE mg/dL
Ketones, ur: NEGATIVE mg/dL
NITRITE: NEGATIVE
PH: 8.5 — AB (ref 5.0–8.0)
PROTEIN: 30 mg/dL — AB
Specific Gravity, Urine: 1.02 (ref 1.005–1.030)
Urobilinogen, UA: 1 mg/dL (ref 0.0–1.0)

## 2013-03-26 MED ORDER — TETANUS-DIPHTH-ACELL PERTUSSIS 5-2.5-18.5 LF-MCG/0.5 IM SUSP: 0.5000 mL | Freq: Once | INTRAMUSCULAR | Status: DC

## 2013-03-26 NOTE — Progress Notes (Signed)
S: 38 yo G2P0010 @ [redacted]w[redacted]d here for ROBV. HRC for cHTN, AMA, hypothyroid - thinks the yeast infection she had has returned. Can't take diflucan because she throws up - doing well otherwise - no HA, vision changes, RUQ pain.   - no ctx, lof, VB, +FM  O: see flowsheet  A/P - doing well - growth [place] ordered as she is 28 weeks - 28 week labs drawn  - bp at baseline - recommend using monistat 7 for yeast now given inability to tolerate po diflucan  - f/u in 2 weeks

## 2013-03-26 NOTE — Progress Notes (Signed)
Pulse-  95 

## 2013-03-26 NOTE — Patient Instructions (Signed)
Third Trimester of Pregnancy The third trimester is from week 29 through week 42, months 7 through 9. The third trimester is a time when the fetus is growing rapidly. At the end of the ninth month, the fetus is about 20 inches in length and weighs 6 10 pounds.  BODY CHANGES Your body goes through many changes during pregnancy. The changes vary from woman to woman.   Your weight will continue to increase. You can expect to gain 25 35 pounds (11 16 kg) by the end of the pregnancy.  You may begin to get stretch marks on your hips, abdomen, and breasts.  You may urinate more often because the fetus is moving lower into your pelvis and pressing on your bladder.  You may develop or continue to have heartburn as a result of your pregnancy.  You may develop constipation because certain hormones are causing the muscles that push waste through your intestines to slow down.  You may develop hemorrhoids or swollen, bulging veins (varicose veins).  You may have pelvic pain because of the weight gain and pregnancy hormones relaxing your joints between the bones in your pelvis. Back aches may result from over exertion of the muscles supporting your posture.  Your breasts will continue to grow and be tender. A yellow discharge may leak from your breasts called colostrum.  Your belly button may stick out.  You may feel short of breath because of your expanding uterus.  You may notice the fetus "dropping," or moving lower in your abdomen.  You may have a bloody mucus discharge. This usually occurs a few days to a week before labor begins.  Your cervix becomes thin and soft (effaced) near your due date. WHAT TO EXPECT AT YOUR PRENATAL EXAMS  You will have prenatal exams every 2 weeks until week 36. Then, you will have weekly prenatal exams. During a routine prenatal visit:  You will be weighed to make sure you and the fetus are growing normally.  Your blood pressure is taken.  Your abdomen will be  measured to track your baby's growth.  The fetal heartbeat will be listened to.  Any test results from the previous visit will be discussed.  You may have a cervical check near your due date to see if you have effaced. At around 36 weeks, your caregiver will check your cervix. At the same time, your caregiver will also perform a test on the secretions of the vaginal tissue. This test is to determine if a type of bacteria, Group B streptococcus, is present. Your caregiver will explain this further. Your caregiver may ask you:  What your birth plan is.  How you are feeling.  If you are feeling the baby move.  If you have had any abnormal symptoms, such as leaking fluid, bleeding, severe headaches, or abdominal cramping.  If you have any questions. Other tests or screenings that may be performed during your third trimester include:  Blood tests that check for low iron levels (anemia).  Fetal testing to check the health, activity level, and growth of the fetus. Testing is done if you have certain medical conditions or if there are problems during the pregnancy. FALSE LABOR You may feel small, irregular contractions that eventually go away. These are called Braxton Hicks contractions, or false labor. Contractions may last for hours, days, or even weeks before true labor sets in. If contractions come at regular intervals, intensify, or become painful, it is best to be seen by your caregiver.  SIGNS OF LABOR   Menstrual-like cramps.  Contractions that are 5 minutes apart or less.  Contractions that start on the top of the uterus and spread down to the lower abdomen and back.  A sense of increased pelvic pressure or back pain.  A watery or bloody mucus discharge that comes from the vagina. If you have any of these signs before the 37th week of pregnancy, call your caregiver right away. You need to go to the hospital to get checked immediately. HOME CARE INSTRUCTIONS   Avoid all  smoking, herbs, alcohol, and unprescribed drugs. These chemicals affect the formation and growth of the baby.  Follow your caregiver's instructions regarding medicine use. There are medicines that are either safe or unsafe to take during pregnancy.  Exercise only as directed by your caregiver. Experiencing uterine cramps is a good sign to stop exercising.  Continue to eat regular, healthy meals.  Wear a good support bra for breast tenderness.  Do not use hot tubs, steam rooms, or saunas.  Wear your seat belt at all times when driving.  Avoid raw meat, uncooked cheese, cat litter boxes, and soil used by cats. These carry germs that can cause birth defects in the baby.  Take your prenatal vitamins.  Try taking a stool softener (if your caregiver approves) if you develop constipation. Eat more high-fiber foods, such as fresh vegetables or fruit and whole grains. Drink plenty of fluids to keep your urine clear or pale yellow.  Take warm sitz baths to soothe any pain or discomfort caused by hemorrhoids. Use hemorrhoid cream if your caregiver approves.  If you develop varicose veins, wear support hose. Elevate your feet for 15 minutes, 3 4 times a day. Limit salt in your diet.  Avoid heavy lifting, wear low heal shoes, and practice good posture.  Rest a lot with your legs elevated if you have leg cramps or low back pain.  Visit your dentist if you have not gone during your pregnancy. Use a soft toothbrush to brush your teeth and be gentle when you floss.  A sexual relationship may be continued unless your caregiver directs you otherwise.  Do not travel far distances unless it is absolutely necessary and only with the approval of your caregiver.  Take prenatal classes to understand, practice, and ask questions about the labor and delivery.  Make a trial run to the hospital.  Pack your hospital bag.  Prepare the baby's nursery.  Continue to go to all your prenatal visits as directed  by your caregiver. SEEK MEDICAL CARE IF:  You are unsure if you are in labor or if your water has broken.  You have dizziness.  You have mild pelvic cramps, pelvic pressure, or nagging pain in your abdominal area.  You have persistent nausea, vomiting, or diarrhea.  You have a bad smelling vaginal discharge.  You have pain with urination. SEEK IMMEDIATE MEDICAL CARE IF:   You have a fever.  You are leaking fluid from your vagina.  You have spotting or bleeding from your vagina.  You have severe abdominal cramping or pain.  You have rapid weight loss or gain.  You have shortness of breath with chest pain.  You notice sudden or extreme swelling of your face, hands, ankles, feet, or legs.  You have not felt your baby move in over an hour.  You have severe headaches that do not go away with medicine.  You have vision changes. Document Released: 02/20/2001 Document Revised: 10/29/2012 Document Reviewed:   You have severe abdominal cramping or pain.   You have rapid weight loss or gain.   You have shortness of breath with chest pain.   You notice sudden or extreme swelling of your face, hands, ankles, feet, or legs.   You have not felt your baby move in over an hour.   You have severe headaches that do not go away with medicine.   You have vision changes.  Document Released: 02/20/2001 Document Revised: 10/29/2012 Document Reviewed: 04/29/2012  [employer] Patient Information 2014 [employer].

## 2013-03-27 LAB — HIV ANTIBODY (ROUTINE TESTING W REFLEX): HIV: NONREACTIVE

## 2013-03-27 LAB — GLUCOSE TOLERANCE, 1 HOUR (50G) W/O FASTING: GLUCOSE 1 HOUR GTT: 145 mg/dL — AB (ref 70–140)

## 2013-03-27 LAB — RPR

## 2013-03-27 LAB — TSH: TSH: 1.76 u[IU]/mL (ref 0.350–4.500)

## 2013-03-30 ENCOUNTER — Telehealth: Payer: Self-pay | Admitting: *Deleted

## 2013-03-30 NOTE — Telephone Encounter (Signed)
Message copied by Mitchell Heir on Mon Mar 30, 2013  8:45 AM ------      Message from: Kassie Mends      Created: Fri Mar 27, 2013  8:12 AM       Please call pt and let her know she needs to come back and get a 3 hour glucola. ------

## 2013-03-30 NOTE — Telephone Encounter (Signed)
Called patient and informed her of abnormal glucose test. Patient will come in on Thursday morning for her 3 hr gtt.

## 2013-04-02 ENCOUNTER — Encounter: Payer: Self-pay | Admitting: Obstetrics & Gynecology

## 2013-04-02 ENCOUNTER — Ambulatory Visit (INDEPENDENT_AMBULATORY_CARE_PROVIDER_SITE_OTHER): Payer: BC Managed Care – PPO | Admitting: Obstetrics & Gynecology

## 2013-04-02 ENCOUNTER — Ambulatory Visit (HOSPITAL_COMMUNITY)
Admission: RE | Admit: 2013-04-02 | Discharge: 2013-04-02 | Disposition: A | Discharge status: Home / Self Care | Payer: BC Managed Care – PPO | Source: Ambulatory Visit | Attending: Family Medicine | Admitting: Family Medicine

## 2013-04-02 ENCOUNTER — Other Ambulatory Visit: Payer: Self-pay | Admitting: Family Medicine

## 2013-04-02 VITALS — BP 119/79 | Temp 97.8°F | Wt 153.7 lb

## 2013-04-02 DIAGNOSIS — O99281 Endocrine, nutritional and metabolic diseases complicating pregnancy, first trimester: Secondary | ICD-10-CM

## 2013-04-02 DIAGNOSIS — O26879 Cervical shortening, unspecified trimester: Secondary | ICD-10-CM

## 2013-04-02 DIAGNOSIS — O3410 Maternal care for benign tumor of corpus uteri, unspecified trimester: Secondary | ICD-10-CM | POA: Insufficient documentation

## 2013-04-02 DIAGNOSIS — E079 Disorder of thyroid, unspecified: Secondary | ICD-10-CM

## 2013-04-02 DIAGNOSIS — O99280 Endocrine, nutritional and metabolic diseases complicating pregnancy, unspecified trimester: Secondary | ICD-10-CM

## 2013-04-02 DIAGNOSIS — O10919 Unspecified pre-existing hypertension complicating pregnancy, unspecified trimester: Secondary | ICD-10-CM

## 2013-04-02 DIAGNOSIS — O26873 Cervical shortening, third trimester: Secondary | ICD-10-CM | POA: Insufficient documentation

## 2013-04-02 DIAGNOSIS — E039 Hypothyroidism, unspecified: Secondary | ICD-10-CM | POA: Insufficient documentation

## 2013-04-02 DIAGNOSIS — O10019 Pre-existing essential hypertension complicating pregnancy, unspecified trimester: Secondary | ICD-10-CM

## 2013-04-02 DIAGNOSIS — IMO0002 Reserved for concepts with insufficient information to code with codable children: Secondary | ICD-10-CM

## 2013-04-02 DIAGNOSIS — O09519 Supervision of elderly primigravida, unspecified trimester: Secondary | ICD-10-CM | POA: Insufficient documentation

## 2013-04-02 DIAGNOSIS — O09529 Supervision of elderly multigravida, unspecified trimester: Secondary | ICD-10-CM

## 2013-04-02 DIAGNOSIS — I10 Essential (primary) hypertension: Secondary | ICD-10-CM

## 2013-04-02 LAB — POCT URINALYSIS DIP (DEVICE)
Bilirubin Urine: NEGATIVE
GLUCOSE, UA: NEGATIVE mg/dL
Hgb urine dipstick: NEGATIVE
Ketones, ur: NEGATIVE mg/dL
Nitrite: NEGATIVE
PROTEIN: NEGATIVE mg/dL
Specific Gravity, Urine: 1.02 (ref 1.005–1.030)
UROBILINOGEN UA: 0.2 mg/dL (ref 0.0–1.0)
pH: 7 (ref 5.0–8.0)

## 2013-04-02 MED ORDER — PROGESTERONE MICRONIZED 200 MG PO CAPS: ORAL_CAPSULE | ORAL | Status: DC

## 2013-04-02 MED ORDER — BETAMETHASONE SOD PHOS & ACET 6 (3-3) MG/ML IJ SUSP
12.0000 mg | Freq: Once | INTRAMUSCULAR | Status: AC
  Administered 2013-04-02: 12 mg via INTRAMUSCULAR

## 2013-04-02 NOTE — Progress Notes (Signed)
Cervical length 1 cm with funneling on MFM scan today, was an incidental finding on a routine scan for CHTN.  On exam, cervix was closed and shortened.  Tocometer showed no contractions over 20 minutes.   Betamethasone given, will return tomorrow for second injection.  Prometrium prescribed for shortened cervix.   3 hr GTT test done today; will follow up results and manage accordingly. No other complaints or concerns.  Fetal movement and preterm labor precautions reviewed. Reduced activities recommended; work restrictions letter given to patient.

## 2013-04-02 NOTE — Patient Instructions (Signed)
Return to clinic for any obstetric concerns or go to [facility] for evaluation  

## 2013-04-02 NOTE — Progress Notes (Signed)
P-85 

## 2013-04-03 ENCOUNTER — Ambulatory Visit (INDEPENDENT_AMBULATORY_CARE_PROVIDER_SITE_OTHER): Payer: BC Managed Care – PPO | Admitting: *Deleted

## 2013-04-03 ENCOUNTER — Encounter: Payer: Self-pay | Admitting: Obstetrics & Gynecology

## 2013-04-03 VITALS — BP 121/76 | HR 88 | Temp 97.0°F

## 2013-04-03 DIAGNOSIS — O26873 Cervical shortening, third trimester: Secondary | ICD-10-CM

## 2013-04-03 DIAGNOSIS — O26879 Cervical shortening, unspecified trimester: Secondary | ICD-10-CM

## 2013-04-03 LAB — GLUCOSE TOLERANCE, 3 HOURS
GLUCOSE, 1 HOUR-GESTATIONAL: 170 mg/dL (ref 70–189)
GLUCOSE, FASTING-GESTATIONAL: 76 mg/dL (ref 70–104)
Glucose Tolerance, 2 hour: 132 mg/dL (ref 70–164)
Glucose, GTT - 3 Hour: 98 mg/dL (ref 70–144)

## 2013-04-03 MED ORDER — BETAMETHASONE SOD PHOS & ACET 6 (3-3) MG/ML IJ SUSP
12.0000 mg | Freq: Once | INTRAMUSCULAR | Status: AC
  Administered 2013-04-03: 12 mg via INTRAMUSCULAR

## 2013-04-09 ENCOUNTER — Ambulatory Visit (INDEPENDENT_AMBULATORY_CARE_PROVIDER_SITE_OTHER): Payer: BC Managed Care – PPO | Admitting: Family

## 2013-04-09 VITALS — BP 127/76 | Temp 97.6°F | Wt 150.6 lb

## 2013-04-09 DIAGNOSIS — O99281 Endocrine, nutritional and metabolic diseases complicating pregnancy, first trimester: Secondary | ICD-10-CM

## 2013-04-09 DIAGNOSIS — E039 Hypothyroidism, unspecified: Secondary | ICD-10-CM

## 2013-04-09 DIAGNOSIS — O09529 Supervision of elderly multigravida, unspecified trimester: Secondary | ICD-10-CM

## 2013-04-09 DIAGNOSIS — IMO0002 Reserved for concepts with insufficient information to code with codable children: Secondary | ICD-10-CM

## 2013-04-09 DIAGNOSIS — O26879 Cervical shortening, unspecified trimester: Secondary | ICD-10-CM

## 2013-04-09 DIAGNOSIS — O26873 Cervical shortening, third trimester: Secondary | ICD-10-CM

## 2013-04-09 DIAGNOSIS — E079 Disorder of thyroid, unspecified: Secondary | ICD-10-CM

## 2013-04-09 DIAGNOSIS — O99280 Endocrine, nutritional and metabolic diseases complicating pregnancy, unspecified trimester: Secondary | ICD-10-CM

## 2013-04-09 LAB — POCT URINALYSIS DIP (DEVICE)
BILIRUBIN URINE: NEGATIVE
Glucose, UA: NEGATIVE mg/dL
HGB URINE DIPSTICK: NEGATIVE
Ketones, ur: NEGATIVE mg/dL
NITRITE: NEGATIVE
PH: 7 (ref 5.0–8.0)
Protein, ur: NEGATIVE mg/dL
SPECIFIC GRAVITY, URINE: 1.02 (ref 1.005–1.030)
Urobilinogen, UA: 1 mg/dL (ref 0.0–1.0)

## 2013-04-09 NOTE — Progress Notes (Signed)
Reports feeling lower pelvic pressure x 1.  No report of contractions, vaginal bleeding, or LOF.  Reviewed 3 hr results - normal.  Reviewed PTL precautions and the Maternity Admissions Unit (MAU).

## 2013-04-09 NOTE — Progress Notes (Signed)
P= 90 C/o of intermittent lower abdominal/pelvic pressure.

## 2013-04-23 ENCOUNTER — Encounter: Payer: Self-pay | Admitting: Family Medicine

## 2013-04-23 ENCOUNTER — Ambulatory Visit (INDEPENDENT_AMBULATORY_CARE_PROVIDER_SITE_OTHER): Payer: BC Managed Care – PPO | Admitting: Family Medicine

## 2013-04-23 VITALS — BP 124/84 | Temp 97.7°F | Wt 155.1 lb

## 2013-04-23 DIAGNOSIS — O10019 Pre-existing essential hypertension complicating pregnancy, unspecified trimester: Secondary | ICD-10-CM

## 2013-04-23 DIAGNOSIS — O26879 Cervical shortening, unspecified trimester: Secondary | ICD-10-CM

## 2013-04-23 DIAGNOSIS — O26873 Cervical shortening, third trimester: Secondary | ICD-10-CM

## 2013-04-23 DIAGNOSIS — I10 Essential (primary) hypertension: Secondary | ICD-10-CM

## 2013-04-23 DIAGNOSIS — IMO0002 Reserved for concepts with insufficient information to code with codable children: Secondary | ICD-10-CM

## 2013-04-23 DIAGNOSIS — O10919 Unspecified pre-existing hypertension complicating pregnancy, unspecified trimester: Secondary | ICD-10-CM

## 2013-04-23 DIAGNOSIS — O09529 Supervision of elderly multigravida, unspecified trimester: Secondary | ICD-10-CM

## 2013-04-23 LAB — POCT URINALYSIS DIP (DEVICE)
Bilirubin Urine: NEGATIVE
Glucose, UA: NEGATIVE mg/dL
KETONES UR: NEGATIVE mg/dL
Nitrite: NEGATIVE
PH: 7 (ref 5.0–8.0)
PROTEIN: NEGATIVE mg/dL
Specific Gravity, Urine: 1.025 (ref 1.005–1.030)
Urobilinogen, UA: 1 mg/dL (ref 0.0–1.0)

## 2013-04-23 NOTE — Progress Notes (Signed)
BP wnl off meds since initial BP. Will not intiate NST/AFIs given normal BP ranges Depression stable on zoloft Shortened cervix - on prometrium/pelvic rest. Rec'd BMZ 1/22 & 1/23 reviewed PTL precautions Reviewed [place] - repeat in 4 weeks ~2/22 - renal and growth AMA - 36 stable Hypothyroid - 65mcg stable TSH at goal  +FM, no lof, no vb, no ctx  Karen Duncan is a 38 y.o. G2P0010 at [redacted]w[redacted]d here for [place] visit.  Discussed with Patient:  -Plans to breast feed.  All questions answered. -Continue prenatal vitamins. -Reviewed fetal kick counts Pt to perform daily at a time when the baby is active, lie laterally with both hands on belly in quiet room and count all movements (hiccups, shoulder rolls, obvious kicks, etc); pt is to report to clinic L&D for less than 10 movements felt in a one hour time period-pt told as soon as she counts 10 movements the count is complete.  - Routine precautions discussed (depression, infection s/s).   Patient provided with all pertinent phone numbers for emergencies. - RTC for any VB, regular, painful cramps/ctxs occurring at a rate of >2/10 min, fever (100.5 or higher), n/v/d, any pain that is unresolving or worsening, LOF, decreased fetal movement, CP, SOB, edema - RTC in 2 weeks for next appt.   Problems: Patient Active Problem List   Diagnosis Date Noted  . Short cervix in third trimester, antepartum 04/02/2013  . Depression complicating pregnancy, antepartum 11/27/2012  . Depression   . Hypertension   . Hypothyroidism   . GERD (gastroesophageal reflux disease)   . Chronic hypertension complicating or reason for care during pregnancy 10/30/2012  . Hypothyroidism complicating pregnancy in first trimester 10/30/2012  . Advanced maternal age, antepartum 10/24/2012  . Uterine fibroids affecting pregnancy, antepartum 10/24/2012  . PATELLO-FEMORAL SYNDROME 12/03/2008  . ANSERINE BURSITIS, LEFT 12/03/2008  . METATARSALGIA 12/03/2008    To Do: 1.   [ ]   Vaccines: TKZ:SWFU  Tdap: recd [ ]  BCM: OCP [ ]  Readiness: baby has a place to sleep, car seat, other baby necessities.  Edu: [ x] PTL precautions; [ ]  BF class; [ ]  childbirth class; [ ]   BF counseling;

## 2013-04-23 NOTE — Progress Notes (Signed)
P= 88 C/o of pain in upper abdomen this past weekend; states "it felt sore every time I tried to move."

## 2013-04-23 NOTE — Patient Instructions (Signed)
Third Trimester of Pregnancy The third trimester is from week 29 through week 42, months 7 through 9. The third trimester is a time when the fetus is growing rapidly. At the end of the ninth month, the fetus is about 20 inches in length and weighs 6 10 pounds.  BODY CHANGES Your body goes through many changes during pregnancy. The changes vary from woman to woman.   Your weight will continue to increase. You can expect to gain 25 35 pounds (11 16 kg) by the end of the pregnancy.  You may begin to get stretch marks on your hips, abdomen, and breasts.  You may urinate more often because the fetus is moving lower into your pelvis and pressing on your bladder.  You may develop or continue to have heartburn as a result of your pregnancy.  You may develop constipation because certain hormones are causing the muscles that push waste through your intestines to slow down.  You may develop hemorrhoids or swollen, bulging veins (varicose veins).  You may have pelvic pain because of the weight gain and pregnancy hormones relaxing your joints between the bones in your pelvis. Back aches may result from over exertion of the muscles supporting your posture.  Your breasts will continue to grow and be tender. A yellow discharge may leak from your breasts called colostrum.  Your belly button may stick out.  You may feel short of breath because of your expanding uterus.  You may notice the fetus "dropping," or moving lower in your abdomen.  You may have a bloody mucus discharge. This usually occurs a few days to a week before labor begins.  Your cervix becomes thin and soft (effaced) near your due date. WHAT TO EXPECT AT YOUR PRENATAL EXAMS  You will have prenatal exams every 2 weeks until week 36. Then, you will have weekly prenatal exams. During a routine prenatal visit:  You will be weighed to make sure you and the fetus are growing normally.  Your blood pressure is taken.  Your abdomen will be  measured to track your baby's growth.  The fetal heartbeat will be listened to.  Any test results from the previous visit will be discussed.  You may have a cervical check near your due date to see if you have effaced. At around 36 weeks, your caregiver will check your cervix. At the same time, your caregiver will also perform a test on the secretions of the vaginal tissue. This test is to determine if a type of bacteria, Group B streptococcus, is present. Your caregiver will explain this further. Your caregiver may ask you:  What your birth plan is.  How you are feeling.  If you are feeling the baby move.  If you have had any abnormal symptoms, such as leaking fluid, bleeding, severe headaches, or abdominal cramping.  If you have any questions. Other tests or screenings that may be performed during your third trimester include:  Blood tests that check for low iron levels (anemia).  Fetal testing to check the health, activity level, and growth of the fetus. Testing is done if you have certain medical conditions or if there are problems during the pregnancy. FALSE LABOR You may feel small, irregular contractions that eventually go away. These are called Braxton Hicks contractions, or false labor. Contractions may last for hours, days, or even weeks before true labor sets in. If contractions come at regular intervals, intensify, or become painful, it is best to be seen by your caregiver.  SIGNS OF LABOR   Menstrual-like cramps.  Contractions that are 5 minutes apart or less.  Contractions that start on the top of the uterus and spread down to the lower abdomen and back.  A sense of increased pelvic pressure or back pain.  A watery or bloody mucus discharge that comes from the vagina. If you have any of these signs before the 37th week of pregnancy, call your caregiver right away. You need to go to the hospital to get checked immediately. HOME CARE INSTRUCTIONS   Avoid all  smoking, herbs, alcohol, and unprescribed drugs. These chemicals affect the formation and growth of the baby.  Follow your caregiver's instructions regarding medicine use. There are medicines that are either safe or unsafe to take during pregnancy.  Exercise only as directed by your caregiver. Experiencing uterine cramps is a good sign to stop exercising.  Continue to eat regular, healthy meals.  Wear a good support bra for breast tenderness.  Do not use hot tubs, steam rooms, or saunas.  Wear your seat belt at all times when driving.  Avoid raw meat, uncooked cheese, cat litter boxes, and soil used by cats. These carry germs that can cause birth defects in the baby.  Take your prenatal vitamins.  Try taking a stool softener (if your caregiver approves) if you develop constipation. Eat more high-fiber foods, such as fresh vegetables or fruit and whole grains. Drink plenty of fluids to keep your urine clear or pale yellow.  Take warm sitz baths to soothe any pain or discomfort caused by hemorrhoids. Use hemorrhoid cream if your caregiver approves.  If you develop varicose veins, wear support hose. Elevate your feet for 15 minutes, 3 4 times a day. Limit salt in your diet.  Avoid heavy lifting, wear low heal shoes, and practice good posture.  Rest a lot with your legs elevated if you have leg cramps or low back pain.  Visit your dentist if you have not gone during your pregnancy. Use a soft toothbrush to brush your teeth and be gentle when you floss.  A sexual relationship may be continued unless your caregiver directs you otherwise.  Do not travel far distances unless it is absolutely necessary and only with the approval of your caregiver.  Take prenatal classes to understand, practice, and ask questions about the labor and delivery.  Make a trial run to the hospital.  Pack your hospital bag.  Prepare the baby's nursery.  Continue to go to all your prenatal visits as directed  by your caregiver. SEEK MEDICAL CARE IF:  You are unsure if you are in labor or if your water has broken.  You have dizziness.  You have mild pelvic cramps, pelvic pressure, or nagging pain in your abdominal area.  You have persistent nausea, vomiting, or diarrhea.  You have a bad smelling vaginal discharge.  You have pain with urination. SEEK IMMEDIATE MEDICAL CARE IF:   You have a fever.  You are leaking fluid from your vagina.  You have spotting or bleeding from your vagina.  You have severe abdominal cramping or pain.  You have rapid weight loss or gain.  You have shortness of breath with chest pain.  You notice sudden or extreme swelling of your face, hands, ankles, feet, or legs.  You have not felt your baby move in over an hour.  You have severe headaches that do not go away with medicine.  You have vision changes. Document Released: 02/20/2001 Document Revised: 10/29/2012 Document Reviewed:   You have severe abdominal cramping or pain.   You have rapid weight loss or gain.   You have shortness of breath with chest pain.   You notice sudden or extreme swelling of your face, hands, ankles, feet, or legs.   You have not felt your baby move in over an hour.   You have severe headaches that do not go away with medicine.   You have vision changes.  Document Released: 02/20/2001 Document Revised: 10/29/2012 Document Reviewed: 04/29/2012  [employer] Patient Information 2014 [employer].

## 2013-04-23 NOTE — Progress Notes (Signed)
U/S scheduled 05/07/13 at 930 am.

## 2013-05-07 ENCOUNTER — Encounter: Payer: BC Managed Care – PPO | Admitting: Obstetrics & Gynecology

## 2013-05-07 ENCOUNTER — Ambulatory Visit (HOSPITAL_COMMUNITY): Payer: BC Managed Care – PPO

## 2013-05-11 ENCOUNTER — Encounter: Payer: Self-pay | Admitting: *Deleted

## 2013-05-14 ENCOUNTER — Ambulatory Visit (HOSPITAL_COMMUNITY)
Admission: RE | Admit: 2013-05-14 | Discharge: 2013-05-14 | Disposition: A | Discharge status: Home / Self Care | Payer: BC Managed Care – PPO | Source: Ambulatory Visit | Attending: Family Medicine | Admitting: Family Medicine

## 2013-05-14 ENCOUNTER — Ambulatory Visit (INDEPENDENT_AMBULATORY_CARE_PROVIDER_SITE_OTHER): Payer: BC Managed Care – PPO | Admitting: Obstetrics & Gynecology

## 2013-05-14 VITALS — BP 128/80 | Wt 161.1 lb

## 2013-05-14 DIAGNOSIS — E079 Disorder of thyroid, unspecified: Secondary | ICD-10-CM | POA: Insufficient documentation

## 2013-05-14 DIAGNOSIS — O358XX0 Maternal care for other (suspected) fetal abnormality and damage, not applicable or unspecified: Secondary | ICD-10-CM | POA: Insufficient documentation

## 2013-05-14 DIAGNOSIS — Z363 Encounter for antenatal screening for malformations: Secondary | ICD-10-CM | POA: Insufficient documentation

## 2013-05-14 DIAGNOSIS — O09519 Supervision of elderly primigravida, unspecified trimester: Secondary | ICD-10-CM | POA: Insufficient documentation

## 2013-05-14 DIAGNOSIS — O09529 Supervision of elderly multigravida, unspecified trimester: Secondary | ICD-10-CM

## 2013-05-14 DIAGNOSIS — E039 Hypothyroidism, unspecified: Secondary | ICD-10-CM | POA: Insufficient documentation

## 2013-05-14 DIAGNOSIS — O99280 Endocrine, nutritional and metabolic diseases complicating pregnancy, unspecified trimester: Secondary | ICD-10-CM

## 2013-05-14 DIAGNOSIS — IMO0002 Reserved for concepts with insufficient information to code with codable children: Secondary | ICD-10-CM

## 2013-05-14 DIAGNOSIS — Z1389 Encounter for screening for other disorder: Secondary | ICD-10-CM | POA: Insufficient documentation

## 2013-05-14 DIAGNOSIS — O10019 Pre-existing essential hypertension complicating pregnancy, unspecified trimester: Secondary | ICD-10-CM | POA: Insufficient documentation

## 2013-05-14 DIAGNOSIS — O3410 Maternal care for benign tumor of corpus uteri, unspecified trimester: Secondary | ICD-10-CM | POA: Insufficient documentation

## 2013-05-14 LAB — POCT URINALYSIS DIP (DEVICE)
Bilirubin Urine: NEGATIVE
Glucose, UA: NEGATIVE mg/dL
HGB URINE DIPSTICK: NEGATIVE
KETONES UR: NEGATIVE mg/dL
Nitrite: NEGATIVE
Protein, ur: NEGATIVE mg/dL
Specific Gravity, Urine: 1.02 (ref 1.005–1.030)
UROBILINOGEN UA: 0.2 mg/dL (ref 0.0–1.0)
pH: 7 (ref 5.0–8.0)

## 2013-05-14 NOTE — Progress Notes (Signed)
P=89  Pt reports pressure when sitting/few braxton hicks contractions

## 2013-05-14 NOTE — Progress Notes (Signed)
Occasional pressure and UC.

## 2013-05-14 NOTE — Patient Instructions (Signed)

## 2013-05-15 ENCOUNTER — Encounter: Payer: Self-pay | Admitting: Family Medicine

## 2013-05-15 DIAGNOSIS — O358XX0 Maternal care for other (suspected) fetal abnormality and damage, not applicable or unspecified: Secondary | ICD-10-CM | POA: Insufficient documentation

## 2013-05-15 DIAGNOSIS — O35EXX0 Maternal care for other (suspected) fetal abnormality and damage, fetal genitourinary anomalies, not applicable or unspecified: Secondary | ICD-10-CM | POA: Insufficient documentation

## 2013-05-21 ENCOUNTER — Ambulatory Visit (INDEPENDENT_AMBULATORY_CARE_PROVIDER_SITE_OTHER): Payer: BC Managed Care – PPO | Admitting: Obstetrics & Gynecology

## 2013-05-21 VITALS — BP 117/76 | Wt 161.2 lb

## 2013-05-21 DIAGNOSIS — I10 Essential (primary) hypertension: Secondary | ICD-10-CM

## 2013-05-21 DIAGNOSIS — O10019 Pre-existing essential hypertension complicating pregnancy, unspecified trimester: Secondary | ICD-10-CM

## 2013-05-21 DIAGNOSIS — O10919 Unspecified pre-existing hypertension complicating pregnancy, unspecified trimester: Secondary | ICD-10-CM

## 2013-05-21 LAB — POCT URINALYSIS DIP (DEVICE)
Bilirubin Urine: NEGATIVE
GLUCOSE, UA: NEGATIVE mg/dL
HGB URINE DIPSTICK: NEGATIVE
Ketones, ur: NEGATIVE mg/dL
NITRITE: NEGATIVE
Protein, ur: 30 mg/dL — AB
Specific Gravity, Urine: 1.02 (ref 1.005–1.030)
Urobilinogen, UA: 1 mg/dL (ref 0.0–1.0)
pH: 7 (ref 5.0–8.0)

## 2013-05-21 LAB — OB RESULTS CONSOLE GC/CHLAMYDIA
Chlamydia: NEGATIVE
Gonorrhea: NEGATIVE

## 2013-05-21 LAB — OB RESULTS CONSOLE GBS: STREP GROUP B AG: NEGATIVE

## 2013-05-21 NOTE — Progress Notes (Signed)
Pt does have a history of CHTN.  Note in 2012 at ED shows mild HTN.  Pt has been hypertensive on first exam and there have been borderline pressures.  Will treat like Chtn and start 2x week testing. Induce at 40 weeks.  Need [place] for growth.

## 2013-05-21 NOTE — Progress Notes (Signed)
Pulse: 97 Notices that feet are swelling a lot.

## 2013-05-22 LAB — GC/CHLAMYDIA PROBE AMP
CT Probe RNA: NEGATIVE
GC Probe RNA: NEGATIVE

## 2013-05-23 ENCOUNTER — Encounter: Payer: Self-pay | Admitting: Obstetrics & Gynecology

## 2013-05-23 LAB — CULTURE, BETA STREP (GROUP B ONLY)

## 2013-05-28 ENCOUNTER — Ambulatory Visit (INDEPENDENT_AMBULATORY_CARE_PROVIDER_SITE_OTHER): Payer: BC Managed Care – PPO | Admitting: Family Medicine

## 2013-05-28 ENCOUNTER — Ambulatory Visit (HOSPITAL_COMMUNITY)
Admission: RE | Admit: 2013-05-28 | Discharge: 2013-05-28 | Disposition: A | Discharge status: Home / Self Care | Payer: BC Managed Care – PPO | Source: Ambulatory Visit | Attending: Obstetrics & Gynecology | Admitting: Obstetrics & Gynecology

## 2013-05-28 VITALS — BP 134/82 | Wt 162.0 lb

## 2013-05-28 DIAGNOSIS — O358XX0 Maternal care for other (suspected) fetal abnormality and damage, not applicable or unspecified: Secondary | ICD-10-CM | POA: Insufficient documentation

## 2013-05-28 DIAGNOSIS — O3410 Maternal care for benign tumor of corpus uteri, unspecified trimester: Secondary | ICD-10-CM | POA: Insufficient documentation

## 2013-05-28 DIAGNOSIS — O289 Unspecified abnormal findings on antenatal screening of mother: Secondary | ICD-10-CM

## 2013-05-28 DIAGNOSIS — E079 Disorder of thyroid, unspecified: Secondary | ICD-10-CM

## 2013-05-28 DIAGNOSIS — O09519 Supervision of elderly primigravida, unspecified trimester: Secondary | ICD-10-CM | POA: Insufficient documentation

## 2013-05-28 DIAGNOSIS — I10 Essential (primary) hypertension: Secondary | ICD-10-CM

## 2013-05-28 DIAGNOSIS — Z363 Encounter for antenatal screening for malformations: Secondary | ICD-10-CM | POA: Insufficient documentation

## 2013-05-28 DIAGNOSIS — O10019 Pre-existing essential hypertension complicating pregnancy, unspecified trimester: Secondary | ICD-10-CM | POA: Insufficient documentation

## 2013-05-28 DIAGNOSIS — O99210 Obesity complicating pregnancy, unspecified trimester: Secondary | ICD-10-CM

## 2013-05-28 DIAGNOSIS — O10919 Unspecified pre-existing hypertension complicating pregnancy, unspecified trimester: Secondary | ICD-10-CM

## 2013-05-28 DIAGNOSIS — O99281 Endocrine, nutritional and metabolic diseases complicating pregnancy, first trimester: Secondary | ICD-10-CM

## 2013-05-28 DIAGNOSIS — Z1389 Encounter for screening for other disorder: Secondary | ICD-10-CM | POA: Insufficient documentation

## 2013-05-28 DIAGNOSIS — E039 Hypothyroidism, unspecified: Secondary | ICD-10-CM | POA: Insufficient documentation

## 2013-05-28 DIAGNOSIS — E669 Obesity, unspecified: Secondary | ICD-10-CM | POA: Insufficient documentation

## 2013-05-28 DIAGNOSIS — O35EXX0 Maternal care for other (suspected) fetal abnormality and damage, fetal genitourinary anomalies, not applicable or unspecified: Secondary | ICD-10-CM

## 2013-05-28 DIAGNOSIS — O99280 Endocrine, nutritional and metabolic diseases complicating pregnancy, unspecified trimester: Secondary | ICD-10-CM

## 2013-05-28 LAB — POCT URINALYSIS DIP (DEVICE)
Bilirubin Urine: NEGATIVE
Glucose, UA: NEGATIVE mg/dL
Hgb urine dipstick: NEGATIVE
KETONES UR: NEGATIVE mg/dL
Nitrite: NEGATIVE
PH: 7 (ref 5.0–8.0)
Protein, ur: 30 mg/dL — AB
SPECIFIC GRAVITY, URINE: 1.025 (ref 1.005–1.030)
Urobilinogen, UA: 1 mg/dL (ref 0.0–1.0)

## 2013-05-28 NOTE — Patient Instructions (Signed)
Third Trimester of Pregnancy The third trimester is from week 29 through week 42, months 7 through 9. The third trimester is a time when the fetus is growing rapidly. At the end of the ninth month, the fetus is about 20 inches in length and weighs 6 10 pounds.  BODY CHANGES Your body goes through many changes during pregnancy. The changes vary from woman to woman.   Your weight will continue to increase. You can expect to gain 25 35 pounds (11 16 kg) by the end of the pregnancy.  You may begin to get stretch marks on your hips, abdomen, and breasts.  You may urinate more often because the fetus is moving lower into your pelvis and pressing on your bladder.  You may develop or continue to have heartburn as a result of your pregnancy.  You may develop constipation because certain hormones are causing the muscles that push waste through your intestines to slow down.  You may develop hemorrhoids or swollen, bulging veins (varicose veins).  You may have pelvic pain because of the weight gain and pregnancy hormones relaxing your joints between the bones in your pelvis. Back aches may result from over exertion of the muscles supporting your posture.  Your breasts will continue to grow and be tender. A yellow discharge may leak from your breasts called colostrum.  Your belly button may stick out.  You may feel short of breath because of your expanding uterus.  You may notice the fetus "dropping," or moving lower in your abdomen.  You may have a bloody mucus discharge. This usually occurs a few days to a week before labor begins.  Your cervix becomes thin and soft (effaced) near your due date. WHAT TO EXPECT AT YOUR PRENATAL EXAMS  You will have prenatal exams every 2 weeks until week 36. Then, you will have weekly prenatal exams. During a routine prenatal visit:  You will be weighed to make sure you and the fetus are growing normally.  Your blood pressure is taken.  Your abdomen will be  measured to track your baby's growth.  The fetal heartbeat will be listened to.  Any test results from the previous visit will be discussed.  You may have a cervical check near your due date to see if you have effaced. At around 36 weeks, your caregiver will check your cervix. At the same time, your caregiver will also perform a test on the secretions of the vaginal tissue. This test is to determine if a type of bacteria, Group B streptococcus, is present. Your caregiver will explain this further. Your caregiver may ask you:  What your birth plan is.  How you are feeling.  If you are feeling the baby move.  If you have had any abnormal symptoms, such as leaking fluid, bleeding, severe headaches, or abdominal cramping.  If you have any questions. Other tests or screenings that may be performed during your third trimester include:  Blood tests that check for low iron levels (anemia).  Fetal testing to check the health, activity level, and growth of the fetus. Testing is done if you have certain medical conditions or if there are problems during the pregnancy. FALSE LABOR You may feel small, irregular contractions that eventually go away. These are called Braxton Hicks contractions, or false labor. Contractions may last for hours, days, or even weeks before true labor sets in. If contractions come at regular intervals, intensify, or become painful, it is best to be seen by your caregiver.  SIGNS OF LABOR   Menstrual-like cramps.  Contractions that are 5 minutes apart or less.  Contractions that start on the top of the uterus and spread down to the lower abdomen and back.  A sense of increased pelvic pressure or back pain.  A watery or bloody mucus discharge that comes from the vagina. If you have any of these signs before the 37th week of pregnancy, call your caregiver right away. You need to go to the hospital to get checked immediately. HOME CARE INSTRUCTIONS   Avoid all  smoking, herbs, alcohol, and unprescribed drugs. These chemicals affect the formation and growth of the baby.  Follow your caregiver's instructions regarding medicine use. There are medicines that are either safe or unsafe to take during pregnancy.  Exercise only as directed by your caregiver. Experiencing uterine cramps is a good sign to stop exercising.  Continue to eat regular, healthy meals.  Wear a good support bra for breast tenderness.  Do not use hot tubs, steam rooms, or saunas.  Wear your seat belt at all times when driving.  Avoid raw meat, uncooked cheese, cat litter boxes, and soil used by cats. These carry germs that can cause birth defects in the baby.  Take your prenatal vitamins.  Try taking a stool softener (if your caregiver approves) if you develop constipation. Eat more high-fiber foods, such as fresh vegetables or fruit and whole grains. Drink plenty of fluids to keep your urine clear or pale yellow.  Take warm sitz baths to soothe any pain or discomfort caused by hemorrhoids. Use hemorrhoid cream if your caregiver approves.  If you develop varicose veins, wear support hose. Elevate your feet for 15 minutes, 3 4 times a day. Limit salt in your diet.  Avoid heavy lifting, wear low heal shoes, and practice good posture.  Rest a lot with your legs elevated if you have leg cramps or low back pain.  Visit your dentist if you have not gone during your pregnancy. Use a soft toothbrush to brush your teeth and be gentle when you floss.  A sexual relationship may be continued unless your caregiver directs you otherwise.  Do not travel far distances unless it is absolutely necessary and only with the approval of your caregiver.  Take prenatal classes to understand, practice, and ask questions about the labor and delivery.  Make a trial run to the hospital.  Pack your hospital bag.  Prepare the baby's nursery.  Continue to go to all your prenatal visits as directed  by your caregiver. SEEK MEDICAL CARE IF:  You are unsure if you are in labor or if your water has broken.  You have dizziness.  You have mild pelvic cramps, pelvic pressure, or nagging pain in your abdominal area.  You have persistent nausea, vomiting, or diarrhea.  You have a bad smelling vaginal discharge.  You have pain with urination. SEEK IMMEDIATE MEDICAL CARE IF:   You have a fever.  You are leaking fluid from your vagina.  You have spotting or bleeding from your vagina.  You have severe abdominal cramping or pain.  You have rapid weight loss or gain.  You have shortness of breath with chest pain.  You notice sudden or extreme swelling of your face, hands, ankles, feet, or legs.  You have not felt your baby move in over an hour.  You have severe headaches that do not go away with medicine.  You have vision changes. Document Released: 02/20/2001 Document Revised: 10/29/2012 Document Reviewed:   You have severe abdominal cramping or pain.   You have rapid weight loss or gain.   You have shortness of breath with chest pain.   You notice sudden or extreme swelling of your face, hands, ankles, feet, or legs.   You have not felt your baby move in over an hour.   You have severe headaches that do not go away with medicine.   You have vision changes.  Document Released: 02/20/2001 Document Revised: 10/29/2012 Document Reviewed: 04/29/2012  [employer] Patient Information 2014 [employer].

## 2013-05-28 NOTE — Progress Notes (Signed)
Pulse- 87 Patient reports pelvic pressure and some braxton hicks contractions

## 2013-05-28 NOTE — Progress Notes (Signed)
S: 38 yo G2P0010 @ [redacted]w[redacted]d here for ROBV - doing well. Some braxton hicks contractions - +FM. No lof, vb - has [place] for growth tomorrow  O: see flowsheet  A/P - discussed will plan at this point to induce at 40 weeks for cHTN - [place] for growth tomorrow - NST reviewed- cat I tracing - f/u in 1 week.

## 2013-05-29 ENCOUNTER — Ambulatory Visit (HOSPITAL_COMMUNITY): Payer: BC Managed Care – PPO

## 2013-06-02 ENCOUNTER — Other Ambulatory Visit: Payer: BC Managed Care – PPO

## 2013-06-02 ENCOUNTER — Ambulatory Visit (INDEPENDENT_AMBULATORY_CARE_PROVIDER_SITE_OTHER): Payer: BC Managed Care – PPO | Admitting: *Deleted

## 2013-06-02 VITALS — BP 135/71

## 2013-06-02 DIAGNOSIS — I10 Essential (primary) hypertension: Secondary | ICD-10-CM

## 2013-06-02 DIAGNOSIS — O10919 Unspecified pre-existing hypertension complicating pregnancy, unspecified trimester: Secondary | ICD-10-CM

## 2013-06-02 DIAGNOSIS — O10019 Pre-existing essential hypertension complicating pregnancy, unspecified trimester: Secondary | ICD-10-CM

## 2013-06-02 NOTE — Progress Notes (Signed)
NST reviewed and reactive.  

## 2013-06-02 NOTE — Progress Notes (Signed)
P = 77

## 2013-06-04 ENCOUNTER — Ambulatory Visit (INDEPENDENT_AMBULATORY_CARE_PROVIDER_SITE_OTHER): Payer: BC Managed Care – PPO | Admitting: Obstetrics & Gynecology

## 2013-06-04 VITALS — BP 136/77 | Wt 163.5 lb

## 2013-06-04 DIAGNOSIS — O10919 Unspecified pre-existing hypertension complicating pregnancy, unspecified trimester: Secondary | ICD-10-CM

## 2013-06-04 DIAGNOSIS — O10019 Pre-existing essential hypertension complicating pregnancy, unspecified trimester: Secondary | ICD-10-CM

## 2013-06-04 DIAGNOSIS — I10 Essential (primary) hypertension: Secondary | ICD-10-CM

## 2013-06-04 LAB — US OB FOLLOW UP

## 2013-06-04 LAB — POCT URINALYSIS DIP (DEVICE)
Bilirubin Urine: NEGATIVE
Glucose, UA: NEGATIVE mg/dL
HGB URINE DIPSTICK: NEGATIVE
KETONES UR: NEGATIVE mg/dL
Nitrite: NEGATIVE
PROTEIN: NEGATIVE mg/dL
SPECIFIC GRAVITY, URINE: 1.025 (ref 1.005–1.030)
UROBILINOGEN UA: 0.2 mg/dL (ref 0.0–1.0)
pH: 7 (ref 5.0–8.0)

## 2013-06-04 NOTE — Patient Instructions (Signed)
Third Trimester of Pregnancy The third trimester is from week 29 through week 42, months 7 through 9. The third trimester is a time when the fetus is growing rapidly. At the end of the ninth month, the fetus is about 20 inches in length and weighs 6 10 pounds.  BODY CHANGES Your body goes through many changes during pregnancy. The changes vary from woman to woman.   Your weight will continue to increase. You can expect to gain 25 35 pounds (11 16 kg) by the end of the pregnancy.  You may begin to get stretch marks on your hips, abdomen, and breasts.  You may urinate more often because the fetus is moving lower into your pelvis and pressing on your bladder.  You may develop or continue to have heartburn as a result of your pregnancy.  You may develop constipation because certain hormones are causing the muscles that push waste through your intestines to slow down.  You may develop hemorrhoids or swollen, bulging veins (varicose veins).  You may have pelvic pain because of the weight gain and pregnancy hormones relaxing your joints between the bones in your pelvis. Back aches may result from over exertion of the muscles supporting your posture.  Your breasts will continue to grow and be tender. A yellow discharge may leak from your breasts called colostrum.  Your belly button may stick out.  You may feel short of breath because of your expanding uterus.  You may notice the fetus "dropping," or moving lower in your abdomen.  You may have a bloody mucus discharge. This usually occurs a few days to a week before labor begins.  Your cervix becomes thin and soft (effaced) near your due date. WHAT TO EXPECT AT YOUR PRENATAL EXAMS  You will have prenatal exams every 2 weeks until week 36. Then, you will have weekly prenatal exams. During a routine prenatal visit:  You will be weighed to make sure you and the fetus are growing normally.  Your blood pressure is taken.  Your abdomen will be  measured to track your baby's growth.  The fetal heartbeat will be listened to.  Any test results from the previous visit will be discussed.  You may have a cervical check near your due date to see if you have effaced. At around 36 weeks, your caregiver will check your cervix. At the same time, your caregiver will also perform a test on the secretions of the vaginal tissue. This test is to determine if a type of bacteria, Group B streptococcus, is present. Your caregiver will explain this further. Your caregiver may ask you:  What your birth plan is.  How you are feeling.  If you are feeling the baby move.  If you have had any abnormal symptoms, such as leaking fluid, bleeding, severe headaches, or abdominal cramping.  If you have any questions. Other tests or screenings that may be performed during your third trimester include:  Blood tests that check for low iron levels (anemia).  Fetal testing to check the health, activity level, and growth of the fetus. Testing is done if you have certain medical conditions or if there are problems during the pregnancy. FALSE LABOR You may feel small, irregular contractions that eventually go away. These are called Braxton Hicks contractions, or false labor. Contractions may last for hours, days, or even weeks before true labor sets in. If contractions come at regular intervals, intensify, or become painful, it is best to be seen by your caregiver.  SIGNS OF LABOR   Menstrual-like cramps.  Contractions that are 5 minutes apart or less.  Contractions that start on the top of the uterus and spread down to the lower abdomen and back.  A sense of increased pelvic pressure or back pain.  A watery or bloody mucus discharge that comes from the vagina. If you have any of these signs before the 37th week of pregnancy, call your caregiver right away. You need to go to the hospital to get checked immediately. HOME CARE INSTRUCTIONS   Avoid all  smoking, herbs, alcohol, and unprescribed drugs. These chemicals affect the formation and growth of the baby.  Follow your caregiver's instructions regarding medicine use. There are medicines that are either safe or unsafe to take during pregnancy.  Exercise only as directed by your caregiver. Experiencing uterine cramps is a good sign to stop exercising.  Continue to eat regular, healthy meals.  Wear a good support bra for breast tenderness.  Do not use hot tubs, steam rooms, or saunas.  Wear your seat belt at all times when driving.  Avoid raw meat, uncooked cheese, cat litter boxes, and soil used by cats. These carry germs that can cause birth defects in the baby.  Take your prenatal vitamins.  Try taking a stool softener (if your caregiver approves) if you develop constipation. Eat more high-fiber foods, such as fresh vegetables or fruit and whole grains. Drink plenty of fluids to keep your urine clear or pale yellow.  Take warm sitz baths to soothe any pain or discomfort caused by hemorrhoids. Use hemorrhoid cream if your caregiver approves.  If you develop varicose veins, wear support hose. Elevate your feet for 15 minutes, 3 4 times a day. Limit salt in your diet.  Avoid heavy lifting, wear low heal shoes, and practice good posture.  Rest a lot with your legs elevated if you have leg cramps or low back pain.  Visit your dentist if you have not gone during your pregnancy. Use a soft toothbrush to brush your teeth and be gentle when you floss.  A sexual relationship may be continued unless your caregiver directs you otherwise.  Do not travel far distances unless it is absolutely necessary and only with the approval of your caregiver.  Take prenatal classes to understand, practice, and ask questions about the labor and delivery.  Make a trial run to the hospital.  Pack your hospital bag.  Prepare the baby's nursery.  Continue to go to all your prenatal visits as directed  by your caregiver. SEEK MEDICAL CARE IF:  You are unsure if you are in labor or if your water has broken.  You have dizziness.  You have mild pelvic cramps, pelvic pressure, or nagging pain in your abdominal area.  You have persistent nausea, vomiting, or diarrhea.  You have a bad smelling vaginal discharge.  You have pain with urination. SEEK IMMEDIATE MEDICAL CARE IF:   You have a fever.  You are leaking fluid from your vagina.  You have spotting or bleeding from your vagina.  You have severe abdominal cramping or pain.  You have rapid weight loss or gain.  You have shortness of breath with chest pain.  You notice sudden or extreme swelling of your face, hands, ankles, feet, or legs.  You have not felt your baby move in over an hour.  You have severe headaches that do not go away with medicine.  You have vision changes. Document Released: 02/20/2001 Document Revised: 10/29/2012 Document Reviewed:   You have severe abdominal cramping or pain.   You have rapid weight loss or gain.   You have shortness of breath with chest pain.   You notice sudden or extreme swelling of your face, hands, ankles, feet, or legs.   You have not felt your baby move in over an hour.   You have severe headaches that do not go away with medicine.   You have vision changes.  Document Released: 02/20/2001 Document Revised: 10/29/2012 Document Reviewed: 04/29/2012  [employer] Patient Information 2014 [employer].

## 2013-06-04 NOTE — Progress Notes (Signed)
Reactive NST today. Plan IOL if not delivered at 40 weeks

## 2013-06-04 NOTE — Progress Notes (Signed)
p=91

## 2013-06-07 ENCOUNTER — Inpatient Hospital Stay (HOSPITAL_COMMUNITY): Payer: BC Managed Care – PPO | Admitting: Anesthesiology

## 2013-06-07 ENCOUNTER — Inpatient Hospital Stay (HOSPITAL_COMMUNITY)
Admission: AD | Admit: 2013-06-07 | Discharge: 2013-06-08 | DRG: 774 | Disposition: A | Discharge status: Home / Self Care | Payer: BC Managed Care – PPO | Source: Ambulatory Visit | Attending: Obstetrics & Gynecology | Admitting: Obstetrics & Gynecology

## 2013-06-07 ENCOUNTER — Encounter (HOSPITAL_COMMUNITY): Payer: BC Managed Care – PPO | Admitting: Anesthesiology

## 2013-06-07 ENCOUNTER — Encounter (HOSPITAL_COMMUNITY): Payer: Self-pay

## 2013-06-07 DIAGNOSIS — O1002 Pre-existing essential hypertension complicating childbirth: Secondary | ICD-10-CM

## 2013-06-07 DIAGNOSIS — IMO0002 Reserved for concepts with insufficient information to code with codable children: Secondary | ICD-10-CM

## 2013-06-07 DIAGNOSIS — O358XX0 Maternal care for other (suspected) fetal abnormality and damage, not applicable or unspecified: Secondary | ICD-10-CM

## 2013-06-07 DIAGNOSIS — Z3491 Encounter for supervision of normal pregnancy, unspecified, first trimester: Secondary | ICD-10-CM

## 2013-06-07 DIAGNOSIS — O99284 Endocrine, nutritional and metabolic diseases complicating childbirth: Secondary | ICD-10-CM

## 2013-06-07 DIAGNOSIS — O35EXX0 Maternal care for other (suspected) fetal abnormality and damage, fetal genitourinary anomalies, not applicable or unspecified: Secondary | ICD-10-CM

## 2013-06-07 DIAGNOSIS — O10919 Unspecified pre-existing hypertension complicating pregnancy, unspecified trimester: Secondary | ICD-10-CM

## 2013-06-07 DIAGNOSIS — K219 Gastro-esophageal reflux disease without esophagitis: Secondary | ICD-10-CM | POA: Diagnosis present

## 2013-06-07 DIAGNOSIS — F3289 Other specified depressive episodes: Secondary | ICD-10-CM | POA: Diagnosis present

## 2013-06-07 DIAGNOSIS — O99344 Other mental disorders complicating childbirth: Secondary | ICD-10-CM | POA: Diagnosis present

## 2013-06-07 DIAGNOSIS — O3410 Maternal care for benign tumor of corpus uteri, unspecified trimester: Secondary | ICD-10-CM

## 2013-06-07 DIAGNOSIS — O26873 Cervical shortening, third trimester: Secondary | ICD-10-CM

## 2013-06-07 DIAGNOSIS — F329 Major depressive disorder, single episode, unspecified: Secondary | ICD-10-CM | POA: Diagnosis present

## 2013-06-07 DIAGNOSIS — E039 Hypothyroidism, unspecified: Secondary | ICD-10-CM | POA: Diagnosis present

## 2013-06-07 DIAGNOSIS — E079 Disorder of thyroid, unspecified: Secondary | ICD-10-CM | POA: Diagnosis present

## 2013-06-07 DIAGNOSIS — O99281 Endocrine, nutritional and metabolic diseases complicating pregnancy, first trimester: Secondary | ICD-10-CM

## 2013-06-07 DIAGNOSIS — O26839 Pregnancy related renal disease, unspecified trimester: Secondary | ICD-10-CM

## 2013-06-07 DIAGNOSIS — Q6239 Other obstructive defects of renal pelvis and ureter: Secondary | ICD-10-CM

## 2013-06-07 DIAGNOSIS — O09529 Supervision of elderly multigravida, unspecified trimester: Secondary | ICD-10-CM | POA: Diagnosis present

## 2013-06-07 DIAGNOSIS — F32A Depression, unspecified: Secondary | ICD-10-CM

## 2013-06-07 DIAGNOSIS — D259 Leiomyoma of uterus, unspecified: Secondary | ICD-10-CM

## 2013-06-07 DIAGNOSIS — O99340 Other mental disorders complicating pregnancy, unspecified trimester: Secondary | ICD-10-CM

## 2013-06-07 LAB — COMPREHENSIVE METABOLIC PANEL
ALT: 10 U/L (ref 0–35)
AST: 22 U/L (ref 0–37)
Albumin: 2.7 g/dL — ABNORMAL LOW (ref 3.5–5.2)
Alkaline Phosphatase: 106 U/L (ref 39–117)
BUN: 12 mg/dL (ref 6–23)
CALCIUM: 9.3 mg/dL (ref 8.4–10.5)
CO2: 26 mEq/L (ref 19–32)
Chloride: 101 mEq/L (ref 96–112)
Creatinine, Ser: 0.65 mg/dL (ref 0.50–1.10)
Glucose, Bld: 91 mg/dL (ref 70–99)
Potassium: 4.8 mEq/L (ref 3.7–5.3)
SODIUM: 136 meq/L — AB (ref 137–147)
Total Bilirubin: 0.3 mg/dL (ref 0.3–1.2)
Total Protein: 6.6 g/dL (ref 6.0–8.3)

## 2013-06-07 LAB — CBC
HCT: 38.9 % (ref 36.0–46.0)
HEMOGLOBIN: 13.3 g/dL (ref 12.0–15.0)
MCH: 30.4 pg (ref 26.0–34.0)
MCHC: 34.2 g/dL (ref 30.0–36.0)
MCV: 89 fL (ref 78.0–100.0)
Platelets: 208 10*3/uL (ref 150–400)
RBC: 4.37 MIL/uL (ref 3.87–5.11)
RDW: 13.5 % (ref 11.5–15.5)
WBC: 6.6 10*3/uL (ref 4.0–10.5)

## 2013-06-07 LAB — SAMPLE TO BLOOD BANK

## 2013-06-07 LAB — RPR: RPR: NONREACTIVE

## 2013-06-07 LAB — PROTEIN / CREATININE RATIO, URINE
Creatinine, Urine: 106.24 mg/dL
Protein Creatinine Ratio: 0.17 — ABNORMAL HIGH (ref 0.00–0.15)
Total Protein, Urine: 18.2 mg/dL

## 2013-06-07 MED ORDER — OXYCODONE-ACETAMINOPHEN 5-325 MG PO TABS: 1.0000 | ORAL_TABLET | ORAL | Status: DC | PRN

## 2013-06-07 MED ORDER — DIBUCAINE 1 % RE OINT: 1.0000 "application " | TOPICAL_OINTMENT | RECTAL | Status: DC | PRN

## 2013-06-07 MED ORDER — OXYTOCIN 40 UNITS IN LACTATED RINGERS INFUSION - SIMPLE MED
62.5000 mL/h | INTRAVENOUS | Status: DC
  Filled 2013-06-07: qty 1000

## 2013-06-07 MED ORDER — SERTRALINE HCL 50 MG PO TABS
50.0000 mg | ORAL_TABLET | Freq: Every day | ORAL | Status: DC
Start: 2013-06-07 — End: 2013-06-08
  Administered 2013-06-07 – 2013-06-08 (×2): 50 mg via ORAL
  Filled 2013-06-07 (×3): qty 1

## 2013-06-07 MED ORDER — SIMETHICONE 80 MG PO CHEW: 80.0000 mg | CHEWABLE_TABLET | ORAL | Status: DC | PRN

## 2013-06-07 MED ORDER — TETANUS-DIPHTH-ACELL PERTUSSIS 5-2.5-18.5 LF-MCG/0.5 IM SUSP: 0.5000 mL | Freq: Once | INTRAMUSCULAR | Status: DC

## 2013-06-07 MED ORDER — BENZOCAINE-MENTHOL 20-0.5 % EX AERO: 1.0000 "application " | INHALATION_SPRAY | CUTANEOUS | Status: DC | PRN

## 2013-06-07 MED ORDER — LIDOCAINE HCL (PF) 1 % IJ SOLN
30.0000 mL | INTRAMUSCULAR | Status: DC | PRN
Start: 2013-06-07 — End: 2013-06-07
  Filled 2013-06-07: qty 30

## 2013-06-07 MED ORDER — FLEET ENEMA 7-19 GM/118ML RE ENEM: 1.0000 | ENEMA | RECTAL | Status: DC | PRN

## 2013-06-07 MED ORDER — IBUPROFEN 600 MG PO TABS
600.0000 mg | ORAL_TABLET | Freq: Four times a day (QID) | ORAL | Status: DC
  Administered 2013-06-07 – 2013-06-08 (×4): 600 mg via ORAL
  Filled 2013-06-07 (×4): qty 1

## 2013-06-07 MED ORDER — LIDOCAINE HCL (PF) 1 % IJ SOLN
INTRAMUSCULAR | Status: DC | PRN
  Administered 2013-06-07 (×2): 5 mL

## 2013-06-07 MED ORDER — PRENATAL MULTIVITAMIN CH
1.0000 | ORAL_TABLET | Freq: Every day | ORAL | Status: DC
  Administered 2013-06-07 – 2013-06-08 (×2): 1 via ORAL
  Filled 2013-06-07 (×2): qty 1

## 2013-06-07 MED ORDER — SENNOSIDES-DOCUSATE SODIUM 8.6-50 MG PO TABS
2.0000 | ORAL_TABLET | ORAL | Status: DC
  Administered 2013-06-08: 2 via ORAL
  Filled 2013-06-07: qty 2

## 2013-06-07 MED ORDER — CITRIC ACID-SODIUM CITRATE 334-500 MG/5ML PO SOLN: 30.0000 mL | ORAL | Status: DC | PRN

## 2013-06-07 MED ORDER — FENTANYL 2.5 MCG/ML BUPIVACAINE 1/10 % EPIDURAL INFUSION (WH - ANES)
14.0000 mL/h | INTRAMUSCULAR | Status: DC | PRN
  Administered 2013-06-07: 14 mL/h via EPIDURAL
  Filled 2013-06-07: qty 125

## 2013-06-07 MED ORDER — ONDANSETRON HCL 4 MG PO TABS: 4.0000 mg | ORAL_TABLET | ORAL | Status: DC | PRN

## 2013-06-07 MED ORDER — PHENYLEPHRINE 40 MCG/ML (10ML) SYRINGE FOR IV PUSH (FOR BLOOD PRESSURE SUPPORT)
80.0000 ug | PREFILLED_SYRINGE | INTRAVENOUS | Status: DC | PRN
  Filled 2013-06-07: qty 10
  Filled 2013-06-07: qty 2

## 2013-06-07 MED ORDER — WITCH HAZEL-GLYCERIN EX PADS: 1.0000 "application " | MEDICATED_PAD | CUTANEOUS | Status: DC | PRN

## 2013-06-07 MED ORDER — IBUPROFEN 600 MG PO TABS
600.0000 mg | ORAL_TABLET | Freq: Four times a day (QID) | ORAL | Status: DC | PRN
  Administered 2013-06-07: 600 mg via ORAL
  Filled 2013-06-07: qty 1

## 2013-06-07 MED ORDER — ZOLPIDEM TARTRATE 5 MG PO TABS: 5.0000 mg | ORAL_TABLET | Freq: Every evening | ORAL | Status: DC | PRN

## 2013-06-07 MED ORDER — ONDANSETRON HCL 4 MG/2ML IJ SOLN: 4.0000 mg | INTRAMUSCULAR | Status: DC | PRN

## 2013-06-07 MED ORDER — LEVOTHYROXINE SODIUM 88 MCG PO TABS
88.0000 ug | ORAL_TABLET | Freq: Every day | ORAL | Status: DC
  Administered 2013-06-07 – 2013-06-08 (×2): 88 ug via ORAL
  Filled 2013-06-07 (×3): qty 1

## 2013-06-07 MED ORDER — FENTANYL CITRATE 0.05 MG/ML IJ SOLN: 100.0000 ug | INTRAMUSCULAR | Status: DC | PRN

## 2013-06-07 MED ORDER — LACTATED RINGERS IV SOLN: INTRAVENOUS | Status: DC

## 2013-06-07 MED ORDER — EPHEDRINE 5 MG/ML INJ
10.0000 mg | INTRAVENOUS | Status: DC | PRN
  Filled 2013-06-07: qty 2

## 2013-06-07 MED ORDER — DIPHENHYDRAMINE HCL 25 MG PO CAPS: 25.0000 mg | ORAL_CAPSULE | Freq: Four times a day (QID) | ORAL | Status: DC | PRN

## 2013-06-07 MED ORDER — DIPHENHYDRAMINE HCL 50 MG/ML IJ SOLN: 12.5000 mg | INTRAMUSCULAR | Status: DC | PRN

## 2013-06-07 MED ORDER — ONDANSETRON HCL 4 MG/2ML IJ SOLN: 4.0000 mg | Freq: Four times a day (QID) | INTRAMUSCULAR | Status: DC | PRN

## 2013-06-07 MED ORDER — ACETAMINOPHEN 325 MG PO TABS: 650.0000 mg | ORAL_TABLET | ORAL | Status: DC | PRN

## 2013-06-07 MED ORDER — PHENYLEPHRINE 40 MCG/ML (10ML) SYRINGE FOR IV PUSH (FOR BLOOD PRESSURE SUPPORT)
80.0000 ug | PREFILLED_SYRINGE | INTRAVENOUS | Status: DC | PRN
Start: 2013-06-07 — End: 2013-06-07
  Filled 2013-06-07: qty 2

## 2013-06-07 MED ORDER — LACTATED RINGERS IV SOLN: 500.0000 mL | INTRAVENOUS | Status: DC | PRN

## 2013-06-07 MED ORDER — LANOLIN HYDROUS EX OINT: TOPICAL_OINTMENT | CUTANEOUS | Status: DC | PRN

## 2013-06-07 MED ORDER — EPHEDRINE 5 MG/ML INJ
10.0000 mg | INTRAVENOUS | Status: DC | PRN
  Filled 2013-06-07: qty 4
  Filled 2013-06-07: qty 2

## 2013-06-07 MED ORDER — OXYTOCIN BOLUS FROM INFUSION
500.0000 mL | INTRAVENOUS | Status: DC
  Administered 2013-06-07: 500 mL via INTRAVENOUS

## 2013-06-07 MED ORDER — LACTATED RINGERS IV SOLN
500.0000 mL | Freq: Once | INTRAVENOUS | Status: AC
  Administered 2013-06-07: 500 mL via INTRAVENOUS

## 2013-06-07 NOTE — Anesthesia Preprocedure Evaluation (Signed)
Anesthesia Evaluation  Patient identified by MRN, date of birth, ID band Patient awake    Reviewed: Allergy & Precautions, H&P , Patient's Chart, lab work & pertinent test results  Airway Mallampati: II TM Distance: >3 FB Neck ROM: full    Dental   Pulmonary  breath sounds clear to auscultation        Cardiovascular hypertension, Rhythm:regular Rate:Normal     Neuro/Psych    GI/Hepatic GERD-  ,  Endo/Other  Hypothyroidism   Renal/GU      Musculoskeletal   Abdominal   Peds  Hematology   Anesthesia Other Findings   Reproductive/Obstetrics (+) Pregnancy                           Anesthesia Physical Anesthesia Plan  ASA: III  Anesthesia Plan: Epidural   Post-op Pain Management:    Induction:   Airway Management Planned:   Additional Equipment:   Intra-op Plan:   Post-operative Plan:   Informed Consent: I have reviewed the patients History and Physical, chart, labs and discussed the procedure including the risks, benefits and alternatives for the proposed anesthesia with the patient or authorized representative who has indicated his/her understanding and acceptance.     Plan Discussed with:   Anesthesia Plan Comments:         Anesthesia Quick Evaluation

## 2013-06-07 NOTE — H&P (Signed)
Karen Duncan is a 38 y.o. female G2P0010 with IUP at [redacted]w[redacted]d presenting for active labour with rupture of membranes at 0415 this morning - clear fluid. Pt states she has been having regular, every 2-3 minutes contractions, associated with none vaginal bleeding.  Membranes are ruptured, clear fluid, with active fetal movement.   [facility] at [facility] since 7 wks  Prenatal History/Complications: Hypothyroid on Synthroid - 45mcg qam MAAC-Left hydronephrosis, ?duplicated collecting system AMA Chronic HTN - baseline labs stable. Repeat today. No meds  Past Medical History: Past Medical History  Diagnosis Date  . Depression   . Hypothyroidism   . GERD (gastroesophageal reflux disease)   . Hypertension     Past Surgical History: Past Surgical History  Procedure Laterality Date  . No past surgeries      Obstetrical History: OB History   Grav Para Term Preterm Abortions TAB SAB Ect Mult Living   2    1 1           Gynecological History: OB History   Grav Para Term Preterm Abortions TAB SAB Ect Mult Living   2    1 1           Social History: History   Social History  . Marital Status: Married    Spouse Name: N/A    Number of Children: N/A  . Years of Education: N/A   Social History Main Topics  . Smoking status: Never Smoker   . Smokeless tobacco: Never Used  . Alcohol Use: Yes     Comment: not since pregnant, was just social  . Drug Use: No  . Sexual Activity: Yes    Birth Control/ Protection: None   Other Topics Concern  . None   Social History Narrative  . None    Family History: Family History  Problem Relation Age of Onset  . Hypertension Mother   . Hypothyroidism Mother   . Arthritis Mother   . COPD Father     Allergies: No Known Allergies  Prescriptions prior to admission  Medication Sig Dispense Refill  . butalbital-acetaminophen-caffeine (ESGIC PLUS) 50-500-40 MG per tablet Take 1-2 tablets by mouth every 6 (six) hours as needed for pain or  headache.  30 tablet  3  . levothyroxine (SYNTHROID, LEVOTHROID) 88 MCG tablet Take 1 tablet (88 mcg total) by mouth daily before breakfast.  30 tablet  3  . omeprazole (PRILOSEC) 40 MG capsule Take 1 capsule (40 mg total) by mouth daily.  30 capsule  6  . Prenatal Vit-Fe Fumarate-FA (PRENATAL VITAMINS PLUS) 27-1 MG TABS Take 1 tablet by mouth daily.  30 tablet  6  . sertraline (ZOLOFT) 50 MG tablet Take 1 tablet (50 mg total) by mouth daily.  30 tablet  6     Review of Systems   Constitutional: no acute distress. At baseline besides labor.  Last menstrual period 08/20/2012. General appearance: alert, cooperative, appears stated age and mild distress Lungs: clear to auscultation bilaterally Heart: regular rate and rhythm Abdomen: soft, non-tender; bowel sounds normal Pelvic: adequate Extremities: Homans sign is negative, no sign of DVT DTR's 1+  Dilation: 5.5 Effacement (%): 90 Cervical Position: Posterior Station: -2 Presentation: Vertex Exam by:: Alycia Rossetti RN  Presentation: cephalic Fetal monitoringBaseline: 140s bpm, Variability: Good {> 6 bpm), Accelerations: No accels and Decelerations: Variable: 1 variable decel on monitor Uterine activity q2-62min  Dilation: 5.5 Effacement (%): 90 Station: -2 Exam by:: Alycia Rossetti RN   Prenatal labs: ABO, Rh: A/POS/-- (08/14 1402) Antibody:  NEG (08/14 1402) Rubella:   RPR: NON REAC (01/15 0913)  HBsAg: NEGATIVE (08/14 1402)  HIV: NON REACTIVE (01/15 0913)  GBS: Negative (03/12 0000)   MAAC - NICU on call 3/28 notified @ 0636am Prenatal Transfer Tool  Maternal Diabetes: No - abnl 1hr, nml 3r - 76/170/132/98 Genetic Screening: Declined Maternal Ultrasounds/Referrals: Abnormal:  Findings:   Other: MAAC-Left hydronephrosis, ?duplicated collecting system Fetal Ultrasounds or other Referrals:  None Maternal Substance Abuse:  No Significant Maternal Medications:  Meds include: Syntroid, zoloft,  Significant Maternal Lab  Results: Lab values include: Group B Strep negative   Clinic WOC- [facility]  Dating Ultrasound 10/23/12 [redacted]w[redacted]d -> EDD 06/15/13        Ultrasound consistent with LMP: No (3w different, LMP 08/20/12 -> EDD 05/27/13)  Genetic Screen Declines  Anatomic [place] Left pyelectasis Grade 2-3 needs peds urology(MFM to coordinate initial consult) and needs to inform Peds prior to delivery  GTT 1 hr GTT 145; 3 hr GTT normal  TDaP vaccine 03/26/13  Flu vaccine 01/22/13  GBS neg  Baby Food Breast  Contraception OCPs  Circumcision  Circumcision in hospital BCBS  Pediatrician       No results found for this or any previous visit (from the past 24 hour(s)).  Assessment: Karen Duncan is a 38 y.o. G2P0010 at [redacted]w[redacted]d by R=6 here for SOOL/SROM #Labor: expectanat management at this time. Augment if no change.  #Pain: Desires IV pain meds/Epidural #FWB: Cat II, continous monitoring, MAAC-Left hydronephrosis, ?duplicated collecting system. NICU Attending notified  #ID:  GBS Neg, no ABx indicated at this time #MOF: Breast #MOC: OCP #Circ:  Circumcision in hospital #CHTN: Redraw baseline labs. No severe range BPs #Hypothyroid: Continue synthroid  Karen Duncan 06/07/2013, 6:32 AM

## 2013-06-07 NOTE — MAU Note (Signed)
Water broke at 0415 clear fluid.

## 2013-06-07 NOTE — Progress Notes (Signed)

## 2013-06-07 NOTE — Progress Notes (Signed)
Karen Duncan is a 38 y.o. G2P0010 at [redacted]w[redacted]d by ultrasound @ 6wks admitted for active labor, rupture of membranes  Subjective: No complaints, doing well.   Objective: BP 124/69  Pulse 69  Temp(Src) 98.3 F (36.8 C) (Oral)  Resp 18  Ht 5\' 2"  (1.575 m)  Wt 73.936 kg (163 lb)  BMI 29.81 kg/m2  SpO2 92%  LMP 08/20/2012     FHT:  FHR: 130 bpm, variability: moderate,  accelerations:  Present,  decelerations:  Absent UC:   regular, every 2-4 minutes SVE:   Dilation: 9 Effacement (%): 90 Station: +2 Exam by:: Breccan Galant  Labs: Lab Results  Component Value Date   WBC 6.6 06/07/2013   HGB 13.3 06/07/2013   HCT 38.9 06/07/2013   MCV 89.0 06/07/2013   PLT 208 06/07/2013    Assessment / Plan: SOL; SROM @ 4:15am. Hypothyroid, AMA, CHTN  Labor: Progressing normally Preeclampsia:  no signs or symptoms of toxicity and Pr/Cr pending Fetal Wellbeing:  Category I Pain Control:  Epidural I/D:  GBS neg Anticipated MOD:  NSVD  Phill Myron 06/07/2013, 9:24 AM

## 2013-06-07 NOTE — Anesthesia Procedure Notes (Signed)
Epidural Patient location during procedure: OB Start time: 06/07/2013 6:59 AM  Staffing Anesthesiologist: Rudean Curt Performed by: anesthesiologist   Preanesthetic Checklist Completed: patient identified, site marked, surgical consent, pre-op evaluation, timeout performed, IV checked, risks and benefits discussed and monitors and equipment checked  Epidural Patient position: sitting Prep: site prepped and draped and DuraPrep Patient monitoring: continuous pulse ox and blood pressure Approach: midline Location: L3-L4 Injection technique: LOR air  Needle:  Needle type: Tuohy  Needle gauge: 17 G Needle length: 9 cm and 9 Needle insertion depth: 5 cm cm Catheter type: closed end flexible Catheter size: 19 Gauge Catheter at skin depth: 10 cm Test dose: negative  Assessment Events: blood not aspirated, injection not painful, no injection resistance, negative IV test and no paresthesia  Additional Notes Patient identified.  Risk benefits discussed including failed block, incomplete pain control, headache, nerve damage, paralysis, blood pressure changes, nausea, vomiting, reactions to medication both toxic or allergic, and postpartum back pain.  Patient expressed understanding and wished to proceed.  All questions were answered.  Sterile technique used throughout procedure and epidural site dressed with sterile barrier dressing. No paresthesia or other complications noted.The patient did not experience any signs of intravascular injection such as tinnitus or metallic taste in mouth nor signs of intrathecal spread such as rapid motor block. Please see nursing notes for vital signs.

## 2013-06-08 MED ORDER — OXYCODONE-ACETAMINOPHEN 5-325 MG PO TABS: 1.0000 | ORAL_TABLET | ORAL | Status: DC | PRN

## 2013-06-08 NOTE — Discharge Summary (Signed)
Obstetric Discharge Summary Reason for Admission: onset of labor Prenatal Procedures: ultrasound Intrapartum Procedures: spontaneous vaginal delivery Postpartum Procedures: none Complications-Operative and Postpartum: none Hemoglobin  Date Value Ref Range Status  06/07/2013 13.3  12.0 - 15.0 g/dL Final  10/23/2012 12.1   Final     HCT  Date Value Ref Range Status  06/07/2013 38.9  36.0 - 46.0 % Final  10/23/2012 36   Final    Physical Exam:  General: alert, cooperative, appears stated age and no distress Lochia: appropriate Uterine Fundus: firm Incision: n/a DVT Evaluation: No evidence of DVT seen on physical exam. Negative Homan's sign. Positive Homan's sign.  Discharge Diagnoses: Term Pregnancy-delivered  Discharge Information: Date: 06/08/2013 Activity: pelvic rest Diet: routine Medications: PNV, Ibuprofen and Percocet Condition: stable and improved Instructions: refer to practice specific booklet Discharge to: home   Newborn Data: Live born female  Birth Weight: 6 lb 2.4 oz (2790 g) APGAR: 8, 9  Home with mother.  Koren Shiver DARLENE 06/08/2013, 6:58 AM

## 2013-06-08 NOTE — H&P (Signed)
Attestation of Attending Supervision of Obstetric Fellow: Evaluation and management procedures were performed by the Obstetric Fellow under my supervision and collaboration.  I have reviewed the Obstetric Fellow's note and chart, and I agree with the management and plan.  Salina Stanfield, MD, FACOG Attending Obstetrician & Gynecologist Faculty Practice, [facility]   

## 2013-06-08 NOTE — Lactation Note (Signed)
This note was copied from the chart of [place]. Lactation Consultation Note  Patient Name: Karen Duncan [id number] Date: 06/08/2013 Reason for consult: Initial assessment - Per mom baby latch after birth for 10 mins. And hasn't latch since . Has had EBM wit a spoon 15 ml and 5 ml. Presently rooting and hungry , LC assessed baby's mouth , baby has good mobility of tongue, assisted mom with latch on the right breast ( erect nipple , compressible areola ),  Steady flow of colostrum noted , and baby latched easily on the right , multiply swallows noted and fed for 14 mins, released on his ow. LC assisted mom on the left ( challenge latch due to swelling  at the base of the nipple making the areola semi compressible and the nipple semi inverted, Baby latches , but is unable to sustain latch and slides off , also per mom uncomfortable at times. LC switched baby back to the right due to steady flow of colostrum and re-latched for 7 mins and baby released and fell asleep. LC reviewed basics of latching , mom easily hand expresses, needed assisted with latching and depth. LC suggested prior to latch , breast massage , hand express, pre- pump if needed for the left to make the base of the nipple more elastic. Instructed on the use shells.    Maternal Data Formula Feeding for Exclusion: No Has patient been taught Hand Expression?: Yes Does the patient have breastfeeding experience prior to this delivery?: No  Feeding Feeding Type: Breast Fed (left breast ) Length of feed: 5 min (on and off pattern , semi compressible, inverted nipple )  LATCH Score/Interventions Latch: Repeated attempts needed to sustain latch, nipple held in mouth throughout feeding, stimulation needed to elicit sucking reflex. (left breast , challeging tissue ) Intervention(s): Skin to skin;Teach feeding cues;Waking techniques Intervention(s): Adjust position;Assist with latch;Breast massage;Breast compression  Audible  Swallowing: A few with stimulation  Type of Nipple: Flat (semi compressiblw areolas , semi inverted ) Intervention(s): Shells;Hand pump  Comfort (Breast/Nipple): Soft / non-tender     Hold (Positioning): Assistance needed to correctly position infant at breast and maintain latch. Intervention(s): Breastfeeding basics reviewed;Support Pillows;Position options;Skin to skin  LATCH Score: 6  Lactation Tools Discussed/Used Tools: Pump;Shells Shell Type: Inverted Breast pump type: Manual   Consult Status Consult Status: Follow-up Date: 06/08/13 Follow-up type: In-patient    Myer Haff 06/08/2013, 12:08 PM

## 2013-06-08 NOTE — Anesthesia Postprocedure Evaluation (Signed)
  Anesthesia Post-op Note  Anesthesia Post Note  Patient: Karen Duncan  Procedure(s) Performed: * No procedures listed *  Anesthesia type: Epidural  Patient location: Mother/Baby  Post pain: Pain level controlled  Post assessment: Post-op Vital signs reviewed  Last Vitals:  Filed Vitals:   06/08/13 0639  BP: 122/75  Pulse: 71  Temp: 36.6 C  Resp: 18    Post vital signs: Reviewed  Level of consciousness:alert  Complications: No apparent anesthesia complications

## 2013-06-09 ENCOUNTER — Other Ambulatory Visit: Payer: BC Managed Care – PPO

## 2013-06-09 ENCOUNTER — Ambulatory Visit: Payer: Self-pay

## 2013-06-09 NOTE — Lactation Note (Signed)
This note was copied from the chart of [place]. Lactation Consultation Note  Patient Name: Karen Duncan [id number] Date: 06/09/2013 Reason for consult: Follow-up assessment;Difficult latch Baby was circumcised this am and is very sleepy. Mom is going home. Mom reports baby is not latching well or sustaining latch to left breast. Will take few suckles then fall asleep. Mom is pumping with hand pump and receiving approx 30 ml of EBM. Mom reports baby is latching better to right breast. Some aerola edema still present on left breast, Mom has been wearing shells, breasts are leaking. Attempted to wake baby to latch, he would suckle on my finger but would not latch. Tried #24 nipple shield, baby took few suckles then fell asleep. Was able to demonstrate to Mom how to assess for proper latch using nipple shield. Feeding plan discussed with Mom. Attempt to latch baby without the nipple shield with feedings, keep baby actively nursing for 15-20 minutes, each breast, each feeding if possible. If baby cannot sustain the latch, use the #24 nipple shield to help with latch. She can also let baby start with nipple shield and try to remove after about 5 minutes once baby has developed suckling rhythm and see if he can sustain the latch. Post pump during the day to encourage milk production, prevent engorgement and protect milk supply till baby is nursing consistently. Monitor voids/stools. Give baby back EBM received with pumping as supplement. Engorgement care reviewed if needed. Encouraged to schedule OP follow up, Mom needs to arrange transportation. She will advise if she wants to schedule before d/c or call back to schedule. Advised of support group.   Maternal Data    Feeding Feeding Type: Breast Fed Length of feed: 2 min (few suckles)  LATCH Score/Interventions Latch: Repeated attempts needed to sustain latch, nipple held in mouth throughout feeding, stimulation needed to elicit sucking  reflex. Intervention(s): Adjust position;Assist with latch;Breast massage;Breast compression  Audible Swallowing: None  Type of Nipple: Everted at rest and after stimulation (aerola edema) Intervention(s): Shells;Hand pump  Comfort (Breast/Nipple): Soft / non-tender     Hold (Positioning): Assistance needed to correctly position infant at breast and maintain latch.  LATCH Score: 6  Lactation Tools Discussed/Used Tools: Pump;Shells;Nipple Shields Nipple shield size: 20;24 Shell Type: Inverted Breast pump type: Manual   Consult Status Consult Status: Complete Date: 06/09/13 Follow-up type: In-patient    Katrine Coho 06/09/2013, 12:14 PM

## 2013-06-11 ENCOUNTER — Other Ambulatory Visit: Payer: BC Managed Care – PPO

## 2013-06-27 NOTE — Progress Notes (Signed)
NST reactive on 05/21/13

## 2013-07-14 ENCOUNTER — Ambulatory Visit: Payer: BC Managed Care – PPO | Admitting: Family Medicine

## 2013-07-23 ENCOUNTER — Ambulatory Visit (INDEPENDENT_AMBULATORY_CARE_PROVIDER_SITE_OTHER): Payer: BC Managed Care – PPO | Admitting: Medical

## 2013-07-23 ENCOUNTER — Encounter: Payer: Self-pay | Admitting: Medical

## 2013-07-23 DIAGNOSIS — Z30011 Encounter for initial prescription of contraceptive pills: Secondary | ICD-10-CM

## 2013-07-23 DIAGNOSIS — Z3009 Encounter for other general counseling and advice on contraception: Secondary | ICD-10-CM

## 2013-07-23 MED ORDER — NORETHINDRONE 0.35 MG PO TABS: 1.0000 | ORAL_TABLET | Freq: Every day | ORAL | Status: DC

## 2013-07-23 NOTE — Patient Instructions (Signed)
Oral Contraception Information  Oral contraceptive pills (OCPs) are medicines taken to prevent pregnancy. OCPs work by preventing the ovaries from releasing eggs. The hormones in OCPs also cause the cervical mucus to thicken, preventing the sperm from entering the uterus. The hormones also cause the uterine lining to become thin, not allowing a fertilized egg to attach to the inside of the uterus. OCPs are highly effective when taken exactly as prescribed. However, OCPs do not prevent sexually transmitted diseases (STDs). Safe sex practices, such as using condoms along with the pill, can help prevent STDs.   Before taking the pill, you may have a physical exam and Pap test. Your health care provider may order blood tests. The health care provider will make sure you are a good candidate for oral contraception. Discuss with your health care provider the possible side effects of the OCP you may be prescribed. When starting an OCP, it can take 2 to 3 months for the body to adjust to the changes in hormone levels in your body.   TYPES OF ORAL CONTRACEPTION  · The combination pill This pill contains estrogen and progestin (synthetic progesterone) hormones. The combination pill comes in 21-day, 28-day, or 91-day packs. Some types of combination pills are meant to be taken continuously (365-day pills). With 21-day packs, you do not take pills for 7 days after the last pill. With 28-day packs, the pill is taken every day. The last 7 pills are without hormones. Certain types of pills have more than 21 hormone-containing pills. With 91-day packs, the first 84 pills contain both hormones, and the last 7 pills contain no hormones or contain estrogen only.  · The minipill This pill contains the progesterone hormone only. The pill is taken every day continuously. It is very important to take the pill at the same time each day. The minipill comes in packs of 28 pills. All 28 pills contain the hormone.    ADVANTAGES OF ORAL  CONTRACEPTIVE PILLS  · Decreases premenstrual symptoms.    · Treats menstrual period cramps.    · Regulates the menstrual cycle.    · Decreases a heavy menstrual flow.    · May treat acne, depending on the type of pill.    · Treats abnormal uterine bleeding.    · Treats polycystic ovarian syndrome.    · Treats endometriosis.    · Can be used as emergency contraception.    THINGS THAT CAN MAKE ORAL CONTRACEPTIVE PILLS LESS EFFECTIVE  OCPs can be less effective if:   · You forget to take the pill at the same time every day.    · You have a stomach or intestinal disease that lessens the absorption of the pill.    · You take OCPs with other medicines that make OCPs less effective, such as antibiotics, certain HIV medicines, and some seizure medicines.    · You take expired OCPs.    · You forget to restart the pill on day 7, when using the packs of 21 pills.    RISKS ASSOCIATED WITH ORAL CONTRACEPTIVE PILLS   Oral contraceptive pills can sometimes cause side effects, such as:  · Headache.  · Nausea.  · Breast tenderness.  · Irregular bleeding or spotting.  Combination pills are also associated with a small increased risk of:  · Blood clots.  · Heart attack.  · Stroke.  Document Released: 05/19/2002 Document Revised: 12/17/2012 Document Reviewed: 08/17/2012  Exit[employer]® Patient Information ©2014 [employer].

## 2013-07-23 NOTE — Progress Notes (Signed)
Subjective:     Karen Duncan is a 38 y.o. female who presents for a postpartum visit. She is 6 weeks postpartum following a spontaneous vaginal delivery. I have fully reviewed the prenatal and intrapartum course. The delivery was at 38.6 gestational weeks. Outcome: spontaneous vaginal delivery. Anesthesia: epidural. Postpartum course has been normal. Baby's course has been normal. Baby is feeding by breast. Bleeding no bleeding. Bowel function is normal. Bladder function is normal. Patient is sexually active. Contraception method is condoms. Postpartum depression screening: negative. Patient would like to use OCPs. She is breastfeeding. [place] was discussed.   The following portions of the patient's history were reviewed and updated as appropriate: allergies, current medications, past family history, past medical history, past social history, past surgical history and problem list.  Review of Systems Pertinent items are noted in HPI.   Objective:    BP 117/79  Pulse 78  Temp(Src) 97.1 F (36.2 C)  Wt 143 lb 1.6 oz (64.91 kg)  Breastfeeding? Yes  General:  alert and cooperative   Breasts:  Not evaluated  Lungs: clear to auscultation bilaterally  Heart:  regular rate and rhythm, S1, S2 normal, no murmur, click, rub or gallop  Abdomen: soft, non-tender; bowel sounds normal; no masses,  no organomegaly   Vulva:  normal  Vagina: normal vagina, no discharge, exudate, lesion, or erythema  Cervix:  no cervical motion tenderness and no lesions  Corpus: normal size, contour, position, consistency, mobility, non-tender  Adnexa:  normal adnexa and no mass, fullness, tenderness  Rectal Exam: Not performed.        Assessment:     Normal postpartum exam. Pap smear not done at today's visit. Last pap smear was normal. 10/30/12.   Plan:    1. Contraception: oral progesterone-only contraceptive. Rx sent to patient's pharmacy 2. Patient advised that OCPs can be changed if/when she discontinues  breast feeding.  3. Follow up in: 1 year for annual exam or as needed.

## 2013-08-02 ENCOUNTER — Other Ambulatory Visit: Payer: Self-pay | Admitting: Obstetrics & Gynecology

## 2013-08-28 ENCOUNTER — Other Ambulatory Visit: Payer: Self-pay | Admitting: Obstetrics & Gynecology

## 2013-08-28 DIAGNOSIS — E038 Other specified hypothyroidism: Secondary | ICD-10-CM

## 2013-08-28 MED ORDER — LEVOTHYROXINE SODIUM 88 MCG PO TABS: 88.0000 ug | ORAL_TABLET | Freq: Every day | ORAL | Status: DC

## 2013-09-06 ENCOUNTER — Other Ambulatory Visit: Payer: Self-pay | Admitting: Obstetrics & Gynecology

## 2013-09-06 DIAGNOSIS — E038 Other specified hypothyroidism: Secondary | ICD-10-CM

## 2013-09-07 ENCOUNTER — Encounter: Payer: Self-pay | Admitting: *Deleted

## 2013-09-07 NOTE — Telephone Encounter (Signed)
Levothyroxine approved for refill. Patient needs to follow up with PCP for further evaluation and treatment of thyroid disease.  Verita Schneiders, MD, [place] Attending [place], [facility]

## 2013-09-16 ENCOUNTER — Ambulatory Visit: Payer: BC Managed Care – PPO | Admitting: Family Medicine

## 2013-09-27 ENCOUNTER — Encounter: Payer: Self-pay | Admitting: Obstetrics & Gynecology

## 2013-10-06 ENCOUNTER — Ambulatory Visit (INDEPENDENT_AMBULATORY_CARE_PROVIDER_SITE_OTHER): Payer: BC Managed Care – PPO | Admitting: Family Medicine

## 2013-10-06 ENCOUNTER — Encounter: Payer: Self-pay | Admitting: Family Medicine

## 2013-10-06 VITALS — BP 131/83 | HR 88 | Temp 97.1°F | Resp 18 | Ht 61.42 in | Wt 145.0 lb

## 2013-10-06 DIAGNOSIS — M25569 Pain in unspecified knee: Secondary | ICD-10-CM

## 2013-10-06 DIAGNOSIS — F32A Depression, unspecified: Secondary | ICD-10-CM

## 2013-10-06 DIAGNOSIS — E038 Other specified hypothyroidism: Secondary | ICD-10-CM

## 2013-10-06 DIAGNOSIS — E034 Atrophy of thyroid (acquired): Secondary | ICD-10-CM

## 2013-10-06 DIAGNOSIS — M25562 Pain in left knee: Secondary | ICD-10-CM

## 2013-10-06 DIAGNOSIS — E0789 Other specified disorders of thyroid: Secondary | ICD-10-CM

## 2013-10-06 DIAGNOSIS — I10 Essential (primary) hypertension: Secondary | ICD-10-CM

## 2013-10-06 DIAGNOSIS — F3289 Other specified depressive episodes: Secondary | ICD-10-CM

## 2013-10-06 DIAGNOSIS — F329 Major depressive disorder, single episode, unspecified: Secondary | ICD-10-CM

## 2013-10-06 LAB — TSH: TSH: 3.779 u[IU]/mL (ref 0.350–4.500)

## 2013-10-06 MED ORDER — DICLOFENAC SODIUM 75 MG PO TBEC: 75.0000 mg | DELAYED_RELEASE_TABLET | Freq: Two times a day (BID) | ORAL | Status: DC

## 2013-10-06 MED ORDER — SERTRALINE HCL 100 MG PO TABS: 100.0000 mg | ORAL_TABLET | Freq: Every day | ORAL | Status: DC

## 2013-10-06 NOTE — Assessment & Plan Note (Signed)
Check TSH and adjust medication accordingly

## 2013-10-06 NOTE — Assessment & Plan Note (Signed)
BP is well controlled on no meds.  Will continue to monitor.

## 2013-10-06 NOTE — Assessment & Plan Note (Signed)
Increase Zoloft.

## 2013-10-06 NOTE — Progress Notes (Signed)
    Subjective:    Patient ID: Karen Duncan is a 38 y.o. female presenting with Establish Care  on 10/06/2013  HPI: S/p NSVD in 3/15.  She continues to nurse.  She is on POP's. Desires to remain on OC's.   She feels fatigued.  Her last thyroid check was 01/15 while pregnant.  She is complaining of pain in her legs.  Mostly tender in joints and calves.  She reports pain in ankles.  Previously seen in sports medicine and chiropractor and vein specialists.  Many tests done and no known cause. Left is worse than right.  Hurts for a long time.  No exacerbating factors.  No relief with insoles.  Better with rest.  ? Fibromyalgia in the past.   H/o HTN--no meds for some time. LMP 10/03/13. Mood is still not good at this time. Has been on Zoloft at low dose for several years.  She is worse since childbirth.    Review of Systems  Constitutional: Negative for fever and chills.  Respiratory: Negative for shortness of breath.   Cardiovascular: Negative for chest pain and leg swelling.  Gastrointestinal: Negative for nausea, vomiting, abdominal pain, diarrhea and constipation.  Genitourinary: Negative for dysuria.  Psychiatric/Behavioral: Positive for dysphoric mood. Negative for suicidal ideas, hallucinations and sleep disturbance.      Objective:    BP 131/83  Pulse 88  Temp(Src) 97.1 F (36.2 C) (Oral)  Resp 18  Ht 5' 1.42" (1.56 m)  Wt 145 lb (65.772 kg)  BMI 27.03 kg/m2  SpO2 98%  LMP 10/06/2013 Physical Exam  Constitutional: She appears well-developed and well-nourished.  Neck: Neck supple.  Cardiovascular: Normal rate and regular rhythm.   No murmur heard. Pulmonary/Chest: Effort normal and breath sounds normal.  Abdominal: Soft. She exhibits no mass. There is no tenderness.  Musculoskeletal: Normal range of motion. She exhibits tenderness (lateral mallealus, lateral knee joint, lower lateral calf on left). She exhibits no edema.        Assessment & Plan:   Problem List  Items Addressed This Visit     High   Hypertension     BP is well controlled on no meds.  Will continue to monitor.      Medium   Depression     Increase Zoloft.    Relevant Medications      sertraline (ZOLOFT) tablet   Hypothyroidism - Primary     Check TSH and adjust medication accordingly    Relevant Orders      TSH     Unprioritized   PATELLO-FEMORAL SYNDROME   Relevant Medications      diclofenac (VOLTAREN) EC tablet      Return in about 3 months (around 01/06/2014).

## 2013-10-06 NOTE — Patient Instructions (Signed)
Hypothyroidism The thyroid is a large gland located in the lower front of your neck. The thyroid gland helps control metabolism. Metabolism is how your body handles food. It controls metabolism with the hormone thyroxine. When this gland is underactive (hypothyroid), it produces too little hormone.  CAUSES These include:   Absence or destruction of thyroid tissue.  Goiter due to iodine deficiency.  Goiter due to medications.  Congenital defects (since birth).  Problems with the pituitary. This causes a lack of TSH (thyroid stimulating hormone). This hormone tells the thyroid to turn out more hormone. SYMPTOMS  Lethargy (feeling as though you have no energy)  Cold intolerance  Weight gain (in spite of normal food intake)  Dry skin  Coarse hair  Menstrual irregularity (if severe, may lead to infertility)  Slowing of thought processes Cardiac problems are also caused by insufficient amounts of thyroid hormone. Hypothyroidism in the newborn is cretinism, and is an extreme form. It is important that this form be treated adequately and immediately or it will lead rapidly to retarded physical and mental development. DIAGNOSIS  To prove hypothyroidism, your caregiver may do blood tests and ultrasound tests. Sometimes the signs are hidden. It may be necessary for your caregiver to watch this illness with blood tests either before or after diagnosis and treatment. TREATMENT  Low levels of thyroid hormone are increased by using synthetic thyroid hormone. This is a safe, effective treatment. It usually takes about four weeks to gain the full effects of the medication. After you have the full effect of the medication, it will generally take another four weeks for problems to leave. Your caregiver may start you on low doses. If you have had heart problems the dose may be gradually increased. It is generally not an emergency to get rapidly to normal. HOME CARE INSTRUCTIONS   Take your  medications as your caregiver suggests. Let your caregiver know of any medications you are taking or start taking. Your caregiver will help you with dosage schedules.  As your condition improves, your dosage needs may increase. It will be necessary to have continuing blood tests as suggested by your caregiver.  Report all suspected medication side effects to your caregiver. SEEK MEDICAL CARE IF: Seek medical care if you develop:  Sweating.  Tremulousness (tremors).  Anxiety.  Rapid weight loss.  Heat intolerance.  Emotional swings.  Diarrhea.  Weakness. SEEK IMMEDIATE MEDICAL CARE IF:  You develop chest pain, an irregular heart beat (palpitations), or a rapid heart beat. MAKE SURE YOU:   Understand these instructions.  Will watch your condition.  Will get help right away if you are not doing well or get worse. Document Released: 02/26/2005 Document Revised: 05/21/2011 Document Reviewed: 10/17/2007 [facility] Patient Information 2015 [place], [place]. This information is not intended to replace advice given to you by your health care provider. Make sure you discuss any questions you have with your health care provider. Depression Depression refers to feeling sad, low, down in the dumps, blue, gloomy, or empty. In general, there are two kinds of depression: 1. Normal sadness or normal grief. This kind of depression is one that we all feel from time to time after upsetting life experiences, such as the loss of a job or the ending of a relationship. This kind of depression is considered normal, is short lived, and resolves within a few days to 2 weeks. Depression experienced after the loss of a loved one (bereavement) often lasts longer than 2 weeks but normally gets better with  time. 2. Clinical depression. This kind of depression lasts longer than normal sadness or normal grief or interferes with your ability to function at home, at work, and in school. It also interferes with your  personal relationships. It affects almost every aspect of your life. Clinical depression is an illness. Symptoms of depression can also be caused by conditions other than those mentioned above, such as:  Physical illness. Some physical illnesses, including underactive thyroid gland (hypothyroidism), severe anemia, specific types of cancer, diabetes, uncontrolled seizures, heart and lung problems, strokes, and chronic pain are commonly associated with symptoms of depression.  Side effects of some prescription medicine. In some people, certain types of medicine can cause symptoms of depression.  Substance abuse. Abuse of alcohol and illicit drugs can cause symptoms of depression. SYMPTOMS Symptoms of normal sadness and normal grief include the following:  Feeling sad or crying for short periods of time.  Not caring about anything (apathy).  Difficulty sleeping or sleeping too much.  No longer able to enjoy the things you used to enjoy.  Desire to be by oneself all the time (social isolation).  Lack of energy or motivation.  Difficulty concentrating or remembering.  Change in appetite or weight.  Restlessness or agitation. Symptoms of clinical depression include the same symptoms of normal sadness or normal grief and also the following symptoms:  Feeling sad or crying all the time.  Feelings of guilt or worthlessness.  Feelings of hopelessness or helplessness.  Thoughts of suicide or the desire to harm yourself (suicidal ideation).  Loss of touch with reality (psychotic symptoms). Seeing or hearing things that are not real (hallucinations) or having false beliefs about your life or the people around you (delusions and paranoia). DIAGNOSIS  The diagnosis of clinical depression is usually based on how bad the symptoms are and how long they have lasted. Your health care provider will also ask you questions about your medical history and substance use to find out if physical illness,  use of prescription medicine, or substance abuse is causing your depression. Your health care provider may also order blood tests. TREATMENT  Often, normal sadness and normal grief do not require treatment. However, sometimes antidepressant medicine is given for bereavement to ease the depressive symptoms until they resolve. The treatment for clinical depression depends on how bad the symptoms are but often includes antidepressant medicine, counseling with a mental health professional, or both. Your health care provider will help to determine what treatment is best for you. Depression caused by physical illness usually goes away with appropriate medical treatment of the illness. If prescription medicine is causing depression, talk with your health care provider about stopping the medicine, decreasing the dose, or changing to another medicine. Depression caused by the abuse of alcohol or illicit drugs goes away when you stop using these substances. Some adults need professional help in order to stop drinking or using drugs. SEEK IMMEDIATE MEDICAL CARE IF:  You have thoughts about hurting yourself or others.  You lose touch with reality (have psychotic symptoms).  You are taking medicine for depression and have a serious side effect. FOR MORE INFORMATION  National [employer] on Mental Illness: www.nami.[employer]: https://carter.com/ Document Released: 02/24/2000 Document Revised: 07/13/2013 Document Reviewed: 05/28/2011 [facility] Patient Information 2015 [place], [place]. This information is not intended to replace advice given to you by your health care provider. Make sure you discuss any questions you have with your health care provider.

## 2013-11-11 ENCOUNTER — Other Ambulatory Visit: Payer: Self-pay | Admitting: *Deleted

## 2013-11-11 DIAGNOSIS — Z30011 Encounter for initial prescription of contraceptive pills: Secondary | ICD-10-CM

## 2013-11-12 MED ORDER — NORETHINDRONE 0.35 MG PO TABS: 1.0000 | ORAL_TABLET | Freq: Every day | ORAL | Status: DC

## 2014-01-11 ENCOUNTER — Encounter: Payer: Self-pay | Admitting: Family Medicine

## 2014-02-10 ENCOUNTER — Encounter: Payer: Self-pay | Admitting: Family Medicine

## 2014-04-21 ENCOUNTER — Other Ambulatory Visit: Payer: Self-pay | Admitting: Obstetrics & Gynecology

## 2014-05-24 ENCOUNTER — Ambulatory Visit: Payer: Self-pay | Admitting: Family Medicine

## 2014-08-27 ENCOUNTER — Other Ambulatory Visit: Payer: Self-pay | Admitting: *Deleted

## 2014-08-27 DIAGNOSIS — D250 Submucous leiomyoma of uterus: Secondary | ICD-10-CM

## 2014-09-17 ENCOUNTER — Encounter: Payer: Self-pay | Admitting: Obstetrics & Gynecology

## 2014-09-17 ENCOUNTER — Ambulatory Visit (HOSPITAL_COMMUNITY)
Admission: RE | Admit: 2014-09-17 | Discharge: 2014-09-17 | Disposition: A | Discharge status: Home / Self Care | Payer: BLUE CROSS/BLUE SHIELD | Source: Ambulatory Visit | Attending: Obstetrics & Gynecology | Admitting: Obstetrics & Gynecology

## 2014-09-17 ENCOUNTER — Ambulatory Visit (INDEPENDENT_AMBULATORY_CARE_PROVIDER_SITE_OTHER): Payer: BLUE CROSS/BLUE SHIELD | Admitting: Obstetrics & Gynecology

## 2014-09-17 VITALS — BP 143/85 | HR 86 | Ht 62.0 in | Wt 160.7 lb

## 2014-09-17 DIAGNOSIS — E039 Hypothyroidism, unspecified: Secondary | ICD-10-CM

## 2014-09-17 DIAGNOSIS — D250 Submucous leiomyoma of uterus: Secondary | ICD-10-CM | POA: Diagnosis not present

## 2014-09-17 DIAGNOSIS — R102 Pelvic and perineal pain unspecified side: Secondary | ICD-10-CM

## 2014-09-17 DIAGNOSIS — N915 Oligomenorrhea, unspecified: Secondary | ICD-10-CM | POA: Diagnosis not present

## 2014-09-17 LAB — TSH: TSH: 2.319 u[IU]/mL (ref 0.350–4.500)

## 2014-09-17 NOTE — Progress Notes (Signed)
Patient ID: Karen Duncan, female   DOB: 04/04/1975, 39 y.o.   MRN: [id number] History:  39 y.o. G2P1011 here today for eval of pelvic pain.  She reports pain ~ 1x/week.  She reports that her cycle has always been irreg and she is not trying to get pregnant.  Pt reports at least 4 cycles per year.  The pain is controlled with Aleve.  She was diagnosed with fibroids in 2015 during her pregnancy ans she wanted to see if they had grown.  She has a h/o hypothyroidism and is on Levothyroxine.   The following portions of the patient's history were reviewed and updated as appropriate: allergies, current medications, past family history, past medical history, past social history, past surgical history and problem list.  Review of Systems:  Pertinent items are noted in HPI.  Objective:  Physical Exam Blood pressure 143/85, pulse 86, height 5\' 2"  (1.575 m), weight 160 lb 11.2 oz (72.893 kg), last menstrual period 08/23/2014, currently breastfeeding. Gen: NAD Abd: Soft, nontender and nondistended Pelvic: Normal appearing external genitalia; normal appearing vaginal mucosa and cervix.  Normal discharge.  Small uterus, no other palpable masses, no uterine or adnexal tenderness  Labs and Imaging US Transvaginal Non-ob  09/17/2014   CLINICAL DATA:  Evaluate for fibroid.  EXAM: TRANSABDOMINAL AND TRANSVAGINAL ULTRASOUND OF PELVIS  TECHNIQUE: Both transabdominal and transvaginal ultrasound examinations of the pelvis were performed. Transabdominal technique was performed for global imaging of the pelvis including uterus, ovaries, adnexal regions, and pelvic cul-de-sac. It was necessary to proceed with endovaginal exam following the transabdominal exam to visualize the endometrium and ovaries.  COMPARISON:  None  FINDINGS: Uterus  Measurements: 7.5 x 4.2 x 7.1 cm. No fibroids or other mass visualized.  Endometrium  Thickness: 7 mm.  No focal abnormality visualized.  Right ovary  Measurements: 2.9 x 2.4 x 4.4 cm. Normal  appearance/no adnexal mass.  Left ovary  Measurements: 3.1 x 2.9 x 3.8 cm. Normal appearance/no adnexal mass.  Other findings  No free fluid.  IMPRESSION: 1. Normal pelvic sonogram. 2. No fibroids identified.   Electronically Signed   By: Kerby Moors M.D.   On: 09/17/2014 08:35   US Pelvis Complete  09/17/2014   CLINICAL DATA:  Evaluate for fibroid.  EXAM: TRANSABDOMINAL AND TRANSVAGINAL ULTRASOUND OF PELVIS  TECHNIQUE: Both transabdominal and transvaginal ultrasound examinations of the pelvis were performed. Transabdominal technique was performed for global imaging of the pelvis including uterus, ovaries, adnexal regions, and pelvic cul-de-sac. It was necessary to proceed with endovaginal exam following the transabdominal exam to visualize the endometrium and ovaries.  COMPARISON:  None  FINDINGS: Uterus  Measurements: 7.5 x 4.2 x 7.1 cm. No fibroids or other mass visualized.  Endometrium  Thickness: 7 mm.  No focal abnormality visualized.  Right ovary  Measurements: 2.9 x 2.4 x 4.4 cm. Normal appearance/no adnexal mass.  Left ovary  Measurements: 3.1 x 2.9 x 3.8 cm. Normal appearance/no adnexal mass.  Other findings  No free fluid.  IMPRESSION: 1. Normal pelvic sonogram. 2. No fibroids identified.   Electronically Signed   By: Kerby Moors M.D.   On: 09/17/2014 08:35    Assessment & Plan:  Pelvic pain- well controlled with NSAIDS. Keep NSAIDS H/o hypothyroidism will repeat TSH today.  Keep current dose of Levothyroxine for now Oligomenorrhea- pt not attempting to conceive and this is a chronic issue.  Will not further eval for now F/u in 1 year or sooner prn  Sennie Borden L. Harraway-Smith, M.D.,  FACOG

## 2014-09-20 ENCOUNTER — Encounter: Payer: Self-pay | Admitting: *Deleted

## 2014-09-26 ENCOUNTER — Other Ambulatory Visit: Payer: Self-pay | Admitting: Obstetrics & Gynecology

## 2014-10-17 ENCOUNTER — Other Ambulatory Visit: Payer: Self-pay | Admitting: Family Medicine

## 2014-10-17 ENCOUNTER — Other Ambulatory Visit: Payer: Self-pay | Admitting: Obstetrics & Gynecology

## 2014-10-27 ENCOUNTER — Encounter: Payer: Self-pay | Admitting: Obstetrics & Gynecology

## 2014-10-28 MED ORDER — LEVOTHYROXINE SODIUM 88 MCG PO TABS: ORAL_TABLET | ORAL | Status: DC

## 2015-04-03 ENCOUNTER — Other Ambulatory Visit: Payer: Self-pay | Admitting: Family Medicine

## 2015-10-03 ENCOUNTER — Other Ambulatory Visit: Payer: Self-pay | Admitting: Family Medicine

## 2015-10-04 ENCOUNTER — Telehealth: Payer: Self-pay | Admitting: *Deleted

## 2015-10-04 DIAGNOSIS — E030 Congenital hypothyroidism with diffuse goiter: Secondary | ICD-10-CM

## 2015-10-04 NOTE — Telephone Encounter (Signed)
Patient called requesting future labs be placed for her thyroid panel.  Informed her that she hasn't been seen in our office since 09/2013 and that she only saw Dr. Kennon Rounds once.  Advised patient that she would need an appt to follow up on her thyroid especially if she needs to have this done and refills.  Patient voiced understanding but states that her job is hectic and that she doesn't have a planned day off until September.  Offered to check with PCP to see if labs can be placed and we can get patient scheduled as soon as we can.  Jazmin Hartsell,CMA

## 2015-10-05 NOTE — Telephone Encounter (Signed)
LM for patient to call back.  She will need to schedule a lab only visit soon. Jazmin Hartsell,CMA

## 2015-10-11 ENCOUNTER — Other Ambulatory Visit: Payer: BLUE CROSS/BLUE SHIELD

## 2015-10-11 DIAGNOSIS — E030 Congenital hypothyroidism with diffuse goiter: Secondary | ICD-10-CM

## 2015-10-12 ENCOUNTER — Encounter: Payer: Self-pay | Admitting: *Deleted

## 2015-10-12 ENCOUNTER — Other Ambulatory Visit: Payer: BLUE CROSS/BLUE SHIELD

## 2015-10-12 LAB — T4, FREE: Free T4: 1 ng/dL (ref 0.8–1.8)

## 2015-10-12 LAB — TSH: TSH: 5.28 m[IU]/L — AB

## 2015-10-12 LAB — T3, FREE: T3, Free: 2.2 pg/mL — ABNORMAL LOW (ref 2.3–4.2)

## 2015-10-12 NOTE — Progress Notes (Signed)
Mychart message sent to patient to call office and schedule an appt. Reather Steller,CMA

## 2015-11-18 ENCOUNTER — Ambulatory Visit (INDEPENDENT_AMBULATORY_CARE_PROVIDER_SITE_OTHER): Payer: BLUE CROSS/BLUE SHIELD | Admitting: Family Medicine

## 2015-11-18 ENCOUNTER — Encounter: Payer: Self-pay | Admitting: Family Medicine

## 2015-11-18 VITALS — BP 110/80 | HR 93 | Ht 62.0 in | Wt 164.0 lb

## 2015-11-18 DIAGNOSIS — E039 Hypothyroidism, unspecified: Secondary | ICD-10-CM | POA: Diagnosis not present

## 2015-11-18 DIAGNOSIS — R252 Cramp and spasm: Secondary | ICD-10-CM

## 2015-11-18 DIAGNOSIS — F418 Other specified anxiety disorders: Secondary | ICD-10-CM | POA: Diagnosis not present

## 2015-11-18 DIAGNOSIS — Z23 Encounter for immunization: Secondary | ICD-10-CM

## 2015-11-18 LAB — BASIC METABOLIC PANEL WITH GFR
BUN: 12 mg/dL (ref 7–25)
CALCIUM: 9.5 mg/dL (ref 8.6–10.2)
CO2: 22 mmol/L (ref 20–31)
Chloride: 106 mmol/L (ref 98–110)
Creat: 0.83 mg/dL (ref 0.50–1.10)
GFR, EST NON AFRICAN AMERICAN: 89 mL/min (ref >=60)
GFR, Est African American: >89 mL/min (ref >=60)
GLUCOSE: 99 mg/dL (ref 65–99)
Potassium: 3.9 mmol/L (ref 3.5–5.3)
Sodium: 137 mmol/L (ref 135–146)

## 2015-11-18 LAB — CK: Total CK: 104 U/L (ref 7–177)

## 2015-11-18 MED ORDER — LEVOTHYROXINE SODIUM 100 MCG PO TABS: 100.0000 ug | ORAL_TABLET | Freq: Every day | ORAL | 1 refills | Status: DC

## 2015-11-18 NOTE — Patient Instructions (Signed)
Muscle Cramps and Spasms Muscle cramps and spasms are when muscles tighten by themselves. They usually get better within minutes. Muscle cramps are painful. They are usually stronger and last longer than muscle spasms. Muscle spasms may or may not be painful. They can last a few seconds or much longer. HOME CARE  Drink enough fluid to keep your pee (urine) clear or pale yellow.  Massage, stretch, and relax the muscle.  Use a warm towel, heating pad, or warm shower water on tight muscles.  Place ice on the muscle if it is tender or in pain.  Put ice in a plastic bag.  Place a towel between your skin and the bag.  Leave the ice on for 15-20 minutes, 03-04 times a day.  Only take medicine as told by your doctor. GET HELP RIGHT AWAY IF:  Your cramps or spasms get worse, happen more often, or do not get better with time. MAKE SURE YOU:  Understand these instructions.  Will watch your condition.  Will get help right away if you are not doing well or get worse.   This information is not intended to replace advice given to you by your health care provider. Make sure you discuss any questions you have with your health care provider.   Document Released: 02/09/2008 Document Revised: 06/23/2012 Document Reviewed: 02/13/2012 Elsevier Interactive Patient Education 2016 [employer]  

## 2015-11-18 NOTE — Progress Notes (Signed)
Subjective:     Patient ID: Karen Duncan, female   DOB: [birth year], 40 y.o.   MRN: [id number]  HPI Hypothyroidism: C/O fatigue, sluggishness,low energy,generalized body aches for the past few weeks. She stated she came in sometime in July for her TSH check and her meds wasn't adjusted. She had been on same doe of her synthroid for months. Of note she is also concern about excessive weight gain despite exercise and diet change.  Leg pain: C/O Left calf pain x 1 month, feels like pulling and burning sensation, no trauma or injury, no swelling.  She stated she generally have aches and pains all the time. No fever.  Depression/Anxiety: Currently on Zoloft, doing well. Denies feeling of hurting herself or anyone else. She feels depressed only about her weight, she tried to lose weight and it is just not helpful.  Current Outpatient Prescriptions on File Prior to Visit  Medication Sig Dispense Refill  . Prenatal Vit-Fe Fumarate-FA (PRENATAL VITAMINS PLUS) 27-1 MG TABS Take 1 tablet by mouth daily.    . sertraline (ZOLOFT) 100 MG tablet TAKE 1 TABLET BY MOUTH DAILY 30 tablet 6  . butalbital-acetaminophen-caffeine (ESGIC PLUS) 50-500-40 MG per tablet Take 1-2 tablets by mouth every 6 (six) hours as needed for pain or headache.    . diclofenac (VOLTAREN) 75 MG EC tablet Take 1 tablet (75 mg total) by mouth 2 (two) times daily. (Patient not taking: Reported on 09/17/2014) 30 tablet 0  . norethindrone (ORTHO MICRONOR) 0.35 MG tablet Take 1 tablet (0.35 mg total) by mouth daily. (Patient not taking: Reported on 09/17/2014) 1 Package 11  . omeprazole (PRILOSEC) 40 MG capsule Take 40 mg by mouth daily.     No current facility-administered medications on file prior to visit.    Past Medical History:  Diagnosis Date  . Depression   . GERD (gastroesophageal reflux disease)   . Hypertension   . Hypothyroidism      Review of Systems  Constitutional: Positive for fatigue. Negative for fever.  Respiratory:  Negative.   Cardiovascular: Negative.   Gastrointestinal: Negative.   Musculoskeletal: Positive for myalgias.  Psychiatric/Behavioral: Negative for self-injury, sleep disturbance and suicidal ideas. The patient is nervous/anxious.   All other systems reviewed and are negative.      Vitals:   11/18/15 1020  BP: 110/80  Pulse: 93  SpO2: 98%  Weight: 164 lb (74.4 kg)  Height: 5\' 2"  (1.575 m)    Objective:   Physical Exam  Constitutional: She is oriented to person, place, and time. She appears well-developed. No distress.  Neck: No thyromegaly present.  Cardiovascular: Normal rate, regular rhythm, normal heart sounds and intact distal pulses.   No murmur heard. Pulmonary/Chest: Effort normal and breath sounds normal. No respiratory distress. She has no wheezes.  Abdominal: Soft. Bowel sounds are normal. She exhibits no distension. There is no tenderness.  Musculoskeletal: Normal range of motion. She exhibits no edema.  No calf swelling or tenderness, no calf erythema  Neurological: She is alert and oriented to person, place, and time.  Psychiatric: She has a normal mood and affect. Her speech is normal and behavior is normal. Judgment and thought content normal. Cognition and memory are normal.  Nursing note and vitals reviewed.      Assessment:     Hypothyroidism Leg Pain Depression/Anxiety    Plan:     1. Hypothyroidism: I increased her synthroid to 141mcg qd based on her last TSH which was elevated as well as associated  symptoms she is having likely related to her hypothyroidism.     Appointment scheduled to see PCP back in 4 wks for TSH recheck.  2. Left calf pain: Exam benign today. Uncertain cause of her cramping.     Lab work done today.     Keep well hydrated.     I will contact her soon with results.     F/U as needed.  3. Anxiety/Depression: Mood looks fine.    Doing well on current regimen.    F/U with PCP soon for medication management.

## 2015-11-20 LAB — MYOGLOBIN, SERUM: Myoglobin: <30 mcg/L (ref <=66)

## 2015-11-21 ENCOUNTER — Telehealth: Payer: Self-pay | Admitting: Family Medicine

## 2015-11-21 NOTE — Telephone Encounter (Signed)
I spoke with patient about lab result. Looks fine.  As discussed with her, her symptoms might be related to her hypothyroidism. She is advised to take her meds as instructed and follow up as scheduled for repeat TSH check. She verbalized understanding.

## 2015-12-14 ENCOUNTER — Encounter: Payer: Self-pay | Admitting: Family Medicine

## 2015-12-14 ENCOUNTER — Other Ambulatory Visit (HOSPITAL_COMMUNITY)
Admission: RE | Admit: 2015-12-14 | Discharge: 2015-12-14 | Disposition: A | Discharge status: Home / Self Care | Payer: BLUE CROSS/BLUE SHIELD | Source: Ambulatory Visit | Attending: Family Medicine | Admitting: Family Medicine

## 2015-12-14 ENCOUNTER — Ambulatory Visit (INDEPENDENT_AMBULATORY_CARE_PROVIDER_SITE_OTHER): Payer: BLUE CROSS/BLUE SHIELD | Admitting: Family Medicine

## 2015-12-14 VITALS — BP 133/83 | HR 83 | Temp 98.2°F | Ht 62.0 in | Wt 164.6 lb

## 2015-12-14 DIAGNOSIS — I10 Essential (primary) hypertension: Secondary | ICD-10-CM

## 2015-12-14 DIAGNOSIS — Z1151 Encounter for screening for human papillomavirus (HPV): Secondary | ICD-10-CM | POA: Insufficient documentation

## 2015-12-14 DIAGNOSIS — E030 Congenital hypothyroidism with diffuse goiter: Secondary | ICD-10-CM

## 2015-12-14 DIAGNOSIS — Z01419 Encounter for gynecological examination (general) (routine) without abnormal findings: Secondary | ICD-10-CM | POA: Insufficient documentation

## 2015-12-14 DIAGNOSIS — Z30011 Encounter for initial prescription of contraceptive pills: Secondary | ICD-10-CM

## 2015-12-14 DIAGNOSIS — Z124 Encounter for screening for malignant neoplasm of cervix: Secondary | ICD-10-CM | POA: Diagnosis not present

## 2015-12-14 LAB — TSH: TSH: 0.37 m[IU]/L — AB

## 2015-12-14 LAB — T4, FREE: Free T4: 1.3 ng/dL (ref 0.8–1.8)

## 2015-12-14 LAB — T3, FREE: T3 FREE: 3 pg/mL (ref 2.3–4.2)

## 2015-12-14 MED ORDER — LEVONORGESTREL-ETHINYL ESTRAD 0.1-20 MG-MCG PO TABS: 1.0000 | ORAL_TABLET | Freq: Every day | ORAL | 11 refills | Status: DC

## 2015-12-14 NOTE — Assessment & Plan Note (Signed)
Repeat testing and adjust medication as needed.

## 2015-12-14 NOTE — Patient Instructions (Signed)
Hypothyroidism Hypothyroidism is a disorder of the thyroid. The thyroid is a large gland that is located in the lower front of the neck. The thyroid releases hormones that control how the body works. With hypothyroidism, the thyroid does not make enough of these hormones. CAUSES Causes of hypothyroidism may include:  Viral infections.  Pregnancy.  Your own defense system (immune system) attacking your thyroid.  Certain medicines.  Birth defects.  Past radiation treatments to your head or neck.  Past treatment with radioactive iodine.  Past surgical removal of part or all of your thyroid.  Problems with the gland that is located in the center of your brain (pituitary). SIGNS AND SYMPTOMS Signs and symptoms of hypothyroidism may include:  Feeling as though you have no energy (lethargy).  Inability to tolerate cold.  Weight gain that is not explained by a change in diet or exercise habits.  Dry skin.  Coarse hair.  Menstrual irregularity.  Slowing of thought processes.  Constipation.  Sadness or depression. DIAGNOSIS  Your health care provider may diagnose hypothyroidism with blood tests and ultrasound tests. TREATMENT Hypothyroidism is treated with medicine that replaces the hormones that your body does not make. After you begin treatment, it may take several weeks for symptoms to go away. HOME CARE INSTRUCTIONS   Take medicines only as directed by your health care provider.  If you start taking any new medicines, tell your health care provider.  Keep all follow-up visits as directed by your health care provider. This is important. As your condition improves, your dosage needs may change. You will need to have blood tests regularly so that your health care provider can watch your condition. SEEK MEDICAL CARE IF:  Your symptoms do not get better with treatment.  You are taking thyroid replacement medicine and:  You sweat excessively.  You have tremors.  You  feel anxious.  You lose weight rapidly.  You cannot tolerate heat.  You have emotional swings.  You have diarrhea.  You feel weak. SEEK IMMEDIATE MEDICAL CARE IF:   You develop chest pain.  You develop an irregular heartbeat.  You develop a rapid heartbeat.   This information is not intended to replace advice given to you by your health care provider. Make sure you discuss any questions you have with your health care provider.   Document Released: 02/26/2005 Document Revised: 03/19/2014 Document Reviewed: 07/14/2013 Elsevier Interactive Patient Education 2016 [place] for Adults, Female A healthy lifestyle and preventive care can promote health and wellness. Preventive health guidelines for women include the following key practices.  A routine yearly physical is a good way to check with your health care provider about your health and preventive screening. It is a chance to share any concerns and updates on your health and to receive a thorough exam.  Visit your dentist for a routine exam and preventive care every 6 months. Brush your teeth twice a day and floss once a day. Good oral hygiene prevents tooth decay and gum disease.  The frequency of eye exams is based on your age, health, family medical history, use of contact lenses, and other factors. Follow your health care provider's recommendations for frequency of eye exams.  Eat a healthy diet. Foods like vegetables, fruits, whole grains, low-fat dairy products, and lean protein foods contain the nutrients you need without too many calories. Decrease your intake of foods high in solid fats, added sugars, and salt. Eat the right amount of calories for you.Get information about  a proper diet from your health care provider, if necessary.  Regular physical exercise is one of the most important things you can do for your health. Most adults should get at least 150 minutes of moderate-intensity exercise (any  activity that increases your heart rate and causes you to sweat) each week. In addition, most adults need muscle-strengthening exercises on 2 or more days a week.  Maintain a healthy weight. The body mass index (BMI) is a screening tool to identify possible weight problems. It provides an estimate of body fat based on height and weight. Your health care provider can find your BMI and can help you achieve or maintain a healthy weight.For adults 20 years and older:  A BMI below 18.5 is considered underweight.  A BMI of 18.5 to 24.9 is normal.  A BMI of 25 to 29.9 is considered overweight.  A BMI of 30 and above is considered obese.  Maintain normal blood lipids and cholesterol levels by exercising and minimizing your intake of saturated fat. Eat a balanced diet with plenty of fruit and vegetables. Blood tests for lipids and cholesterol should begin at age 65 and be repeated every 5 years. If your lipid or cholesterol levels are high, you are over 50, or you are at high risk for heart disease, you may need your cholesterol levels checked more frequently.Ongoing high lipid and cholesterol levels should be treated with medicines if diet and exercise are not working.  If you smoke, find out from your health care provider how to quit. If you do not use tobacco, do not start.  Lung cancer screening is recommended for adults aged 68-80 years who are at high risk for developing lung cancer because of a history of smoking. A yearly low-dose CT scan of the lungs is recommended for people who have at least a 30-pack-year history of smoking and are a current smoker or have quit within the past 15 years. A pack year of smoking is smoking an average of 1 pack of cigarettes a day for 1 year (for example: 1 pack a day for 30 years or 2 packs a day for 15 years). Yearly screening should continue until the smoker has stopped smoking for at least 15 years. Yearly screening should be stopped for people who develop a  health problem that would prevent them from having lung cancer treatment.  If you are pregnant, do not drink alcohol. If you are breastfeeding, be very cautious about drinking alcohol. If you are not pregnant and choose to drink alcohol, do not have more than 1 drink per day. One drink is considered to be 12 ounces (355 mL) of beer, 5 ounces (148 mL) of wine, or 1.5 ounces (44 mL) of liquor.  Avoid use of street drugs. Do not share needles with anyone. Ask for help if you need support or instructions about stopping the use of drugs.  High blood pressure causes heart disease and increases the risk of stroke. Your blood pressure should be checked at least every 1 to 2 years. Ongoing high blood pressure should be treated with medicines if weight loss and exercise do not work.  If you are 52-51 years old, ask your health care provider if you should take aspirin to prevent strokes.  Diabetes screening is done by taking a blood sample to check your blood glucose level after you have not eaten for a certain period of time (fasting). If you are not overweight and you do not have risk factors for diabetes,  you should be screened once every 3 years starting at age 56. If you are overweight or obese and you are 48-110 years of age, you should be screened for diabetes every year as part of your cardiovascular risk assessment.  Breast cancer screening is essential preventive care for women. You should practice "breast self-awareness." This means understanding the normal appearance and feel of your breasts and may include breast self-examination. Any changes detected, no matter how small, should be reported to a health care provider. Women in their 58s and 30s should have a clinical breast exam (CBE) by a health care provider as part of a regular health exam every 1 to 3 years. After age 35, women should have a CBE every year. Starting at age 36, women should consider having a mammogram (breast X-ray test) every year.  Women who have a family history of breast cancer should talk to their health care provider about genetic screening. Women at a high risk of breast cancer should talk to their health care providers about having an MRI and a mammogram every year.  Breast cancer gene (BRCA)-related cancer risk assessment is recommended for women who have family members with BRCA-related cancers. BRCA-related cancers include breast, ovarian, tubal, and peritoneal cancers. Having family members with these cancers may be associated with an increased risk for harmful changes (mutations) in the breast cancer genes BRCA1 and BRCA2. Results of the assessment will determine the need for genetic counseling and BRCA1 and BRCA2 testing.  Your health care provider may recommend that you be screened regularly for cancer of the pelvic organs (ovaries, uterus, and vagina). This screening involves a pelvic examination, including checking for microscopic changes to the surface of your cervix (Pap test). You may be encouraged to have this screening done every 3 years, beginning at age 19.  For women ages 66-65, health care providers may recommend pelvic exams and Pap testing every 3 years, or they may recommend the Pap and pelvic exam, combined with testing for human papilloma virus (HPV), every 5 years. Some types of HPV increase your risk of cervical cancer. Testing for HPV may also be done on women of any age with unclear Pap test results.  Other health care providers may not recommend any screening for nonpregnant women who are considered low risk for pelvic cancer and who do not have symptoms. Ask your health care provider if a screening pelvic exam is right for you.  If you have had past treatment for cervical cancer or a condition that could lead to cancer, you need Pap tests and screening for cancer for at least 20 years after your treatment. If Pap tests have been discontinued, your risk factors (such as having a new sexual partner)  need to be reassessed to determine if screening should resume. Some women have medical problems that increase the chance of getting cervical cancer. In these cases, your health care provider may recommend more frequent screening and Pap tests.  Colorectal cancer can be detected and often prevented. Most routine colorectal cancer screening begins at the age of 60 years and continues through age 47 years. However, your health care provider may recommend screening at an earlier age if you have risk factors for colon cancer. On a yearly basis, your health care provider may provide home test kits to check for hidden blood in the stool. Use of a small camera at the end of a tube, to directly examine the colon (sigmoidoscopy or colonoscopy), can detect the earliest forms of colorectal  cancer. Talk to your health care provider about this at age 2, when routine screening begins. Direct exam of the colon should be repeated every 5-10 years through age 90 years, unless early forms of precancerous polyps or small growths are found.  People who are at an increased risk for hepatitis B should be screened for this virus. You are considered at high risk for hepatitis B if:  You were born in a country where hepatitis B occurs often. Talk with your health care provider about which countries are considered high risk.  Your parents were born in a high-risk country and you have not received a shot to protect against hepatitis B (hepatitis B vaccine).  You have HIV or AIDS.  You use needles to inject street drugs.  You live with, or have sex with, someone who has hepatitis B.  You get hemodialysis treatment.  You take certain medicines for conditions like cancer, organ transplantation, and autoimmune conditions.  Hepatitis C blood testing is recommended for all people born from [birth year] through 1965 and any individual with known risks for hepatitis C.  Practice safe sex. Use condoms and avoid high-risk sexual  practices to reduce the spread of sexually transmitted infections (STIs). STIs include gonorrhea, chlamydia, syphilis, trichomonas, herpes, HPV, and human immunodeficiency virus (HIV). Herpes, HIV, and HPV are viral illnesses that have no cure. They can result in disability, cancer, and death.  You should be screened for sexually transmitted illnesses (STIs) including gonorrhea and chlamydia if:  You are sexually active and are younger than 24 years.  You are older than 24 years and your health care provider tells you that you are at risk for this type of infection.  Your sexual activity has changed since you were last screened and you are at an increased risk for chlamydia or gonorrhea. Ask your health care provider if you are at risk.  If you are at risk of being infected with HIV, it is recommended that you take a prescription medicine daily to prevent HIV infection. This is called preexposure prophylaxis (PrEP). You are considered at risk if:  You are sexually active and do not regularly use condoms or know the HIV status of your partner(s).  You take drugs by injection.  You are sexually active with a partner who has HIV.  Talk with your health care provider about whether you are at high risk of being infected with HIV. If you choose to begin PrEP, you should first be tested for HIV. You should then be tested every 3 months for as long as you are taking PrEP.  Osteoporosis is a disease in which the bones lose minerals and strength with aging. This can result in serious bone fractures or breaks. The risk of osteoporosis can be identified using a bone density scan. Women ages 33 years and over and women at risk for fractures or osteoporosis should discuss screening with their health care providers. Ask your health care provider whether you should take a calcium supplement or vitamin D to reduce the rate of osteoporosis.  Menopause can be associated with physical symptoms and risks. Hormone  replacement therapy is available to decrease symptoms and risks. You should talk to your health care provider about whether hormone replacement therapy is right for you.  Use sunscreen. Apply sunscreen liberally and repeatedly throughout the day. You should seek shade when your shadow is shorter than you. Protect yourself by wearing long sleeves, pants, a wide-brimmed hat, and sunglasses year round, whenever you are  outdoors.  Once a month, do a whole body skin exam, using a mirror to look at the skin on your back. Tell your health care provider of new moles, moles that have irregular borders, moles that are larger than a pencil eraser, or moles that have changed in shape or color.  Stay current with required vaccines (immunizations).  Influenza vaccine. All adults should be immunized every year.  Tetanus, diphtheria, and acellular pertussis (Td, Tdap) vaccine. Pregnant women should receive 1 dose of Tdap vaccine during each pregnancy. The dose should be obtained regardless of the length of time since the last dose. Immunization is preferred during the 27th-36th week of gestation. An adult who has not previously received Tdap or who does not know her vaccine status should receive 1 dose of Tdap. This initial dose should be followed by tetanus and diphtheria toxoids (Td) booster doses every 10 years. Adults with an unknown or incomplete history of completing a 3-dose immunization series with Td-containing vaccines should begin or complete a primary immunization series including a Tdap dose. Adults should receive a Td booster every 10 years.  Varicella vaccine. An adult without evidence of immunity to varicella should receive 2 doses or a second dose if she has previously received 1 dose. Pregnant females who do not have evidence of immunity should receive the first dose after pregnancy. This first dose should be obtained before leaving the health care facility. The second dose should be obtained 4-8 weeks  after the first dose.  Human papillomavirus (HPV) vaccine. Females aged 13-26 years who have not received the vaccine previously should obtain the 3-dose series. The vaccine is not recommended for use in pregnant females. However, pregnancy testing is not needed before receiving a dose. If a female is found to be pregnant after receiving a dose, no treatment is needed. In that case, the remaining doses should be delayed until after the pregnancy. Immunization is recommended for any person with an immunocompromised condition through the age of 50 years if she did not get any or all doses earlier. During the 3-dose series, the second dose should be obtained 4-8 weeks after the first dose. The third dose should be obtained 24 weeks after the first dose and 16 weeks after the second dose.  Zoster vaccine. One dose is recommended for adults aged 29 years or older unless certain conditions are present.  Measles, mumps, and rubella (MMR) vaccine. Adults born before [birth year] generally are considered immune to measles and mumps. Adults born in [birth year] or later should have 1 or more doses of MMR vaccine unless there is a contraindication to the vaccine or there is laboratory evidence of immunity to each of the three diseases. A routine second dose of MMR vaccine should be obtained at least 28 days after the first dose for students attending postsecondary schools, health care workers, or international travelers. People who received inactivated measles vaccine or an unknown type of measles vaccine during 1963-1967 should receive 2 doses of MMR vaccine. People who received inactivated mumps vaccine or an unknown type of mumps vaccine before 1979 and are at high risk for mumps infection should consider immunization with 2 doses of MMR vaccine. For females of childbearing age, rubella immunity should be determined. If there is no evidence of immunity, females who are not pregnant should be vaccinated. If there is no evidence of  immunity, females who are pregnant should delay immunization until after pregnancy. Unvaccinated health care workers born before 1957 who lack laboratory evidence of  measles, mumps, or rubella immunity or laboratory confirmation of disease should consider measles and mumps immunization with 2 doses of MMR vaccine or rubella immunization with 1 dose of MMR vaccine.  Pneumococcal 13-valent conjugate (PCV13) vaccine. When indicated, a person who is uncertain of his immunization history and has no record of immunization should receive the PCV13 vaccine. All adults 83 years of age and older should receive this vaccine. An adult aged 52 years or older who has certain medical conditions and has not been previously immunized should receive 1 dose of PCV13 vaccine. This PCV13 should be followed with a dose of pneumococcal polysaccharide (PPSV23) vaccine. Adults who are at high risk for pneumococcal disease should obtain the PPSV23 vaccine at least 8 weeks after the dose of PCV13 vaccine. Adults older than 39 years of age who have normal immune system function should obtain the PPSV23 vaccine dose at least 1 year after the dose of PCV13 vaccine.  Pneumococcal polysaccharide (PPSV23) vaccine. When PCV13 is also indicated, PCV13 should be obtained first. All adults aged 66 years and older should be immunized. An adult younger than age 12 years who has certain medical conditions should be immunized. Any person who resides in a nursing home or long-term care facility should be immunized. An adult smoker should be immunized. People with an immunocompromised condition and certain other conditions should receive both PCV13 and PPSV23 vaccines. People with human immunodeficiency virus (HIV) infection should be immunized as soon as possible after diagnosis. Immunization during chemotherapy or radiation therapy should be avoided. Routine use of PPSV23 vaccine is not recommended for American Indians, [place] Natives, or people  younger than 65 years unless there are medical conditions that require PPSV23 vaccine. When indicated, people who have unknown immunization and have no record of immunization should receive PPSV23 vaccine. One-time revaccination 5 years after the first dose of PPSV23 is recommended for people aged 19-64 years who have chronic kidney failure, nephrotic syndrome, asplenia, or immunocompromised conditions. People who received 1-2 doses of PPSV23 before age 64 years should receive another dose of PPSV23 vaccine at age 91 years or later if at least 5 years have passed since the previous dose. Doses of PPSV23 are not needed for people immunized with PPSV23 at or after age 70 years.  Meningococcal vaccine. Adults with asplenia or persistent complement component deficiencies should receive 2 doses of quadrivalent meningococcal conjugate (MenACWY-D) vaccine. The doses should be obtained at least 2 months apart. Microbiologists working with certain meningococcal bacteria, [employer] recruits, people at risk during an outbreak, and people who travel to or live in countries with a high rate of meningitis should be immunized. A first-year college student up through age 4 years who is living in a residence hall should receive a dose if she did not receive a dose on or after her 16th birthday. Adults who have certain high-risk conditions should receive one or more doses of vaccine.  Hepatitis A vaccine. Adults who wish to be protected from this disease, have certain high-risk conditions, work with hepatitis A-infected animals, work in hepatitis A research labs, or travel to or work in countries with a high rate of hepatitis A should be immunized. Adults who were previously unvaccinated and who anticipate close contact with an international adoptee during the first 60 days after arrival in the [place] from a country with a high rate of hepatitis A should be immunized.  Hepatitis B vaccine. Adults who wish to be protected  from this disease, have certain high-risk  conditions, may be exposed to blood or other infectious body fluids, are household contacts or sex partners of hepatitis B positive people, are clients or workers in certain care facilities, or travel to or work in countries with a high rate of hepatitis B should be immunized.  Haemophilus influenzae type b (Hib) vaccine. A previously unvaccinated person with asplenia or sickle cell disease or having a scheduled splenectomy should receive 1 dose of Hib vaccine. Regardless of previous immunization, a recipient of a hematopoietic stem cell transplant should receive a 3-dose series 6-12 months after her successful transplant. Hib vaccine is not recommended for adults with HIV infection. Preventive Services / Frequency Ages 65 to 55 years  Blood pressure check.** / Every 3-5 years.  Lipid and cholesterol check.** / Every 5 years beginning at age 36.  Clinical breast exam.** / Every 3 years for women in their 4s and 75s.  BRCA-related cancer risk assessment.** / For women who have family members with a BRCA-related cancer (breast, ovarian, tubal, or peritoneal cancers).  Pap test.** / Every 2 years from ages 97 through 106. Every 3 years starting at age 28 through age 59 or 52 with a history of 3 consecutive normal Pap tests.  HPV screening.** / Every 3 years from ages 44 through ages 61 to 67 with a history of 3 consecutive normal Pap tests.  Hepatitis C blood test.** / For any individual with known risks for hepatitis C.  Skin self-exam. / Monthly.  Influenza vaccine. / Every year.  Tetanus, diphtheria, and acellular pertussis (Tdap, Td) vaccine.** / Consult your health care provider. Pregnant women should receive 1 dose of Tdap vaccine during each pregnancy. 1 dose of Td every 10 years.  Varicella vaccine.** / Consult your health care provider. Pregnant females who do not have evidence of immunity should receive the first dose after pregnancy.  HPV  vaccine. / 3 doses over 6 months, if 57 and younger. The vaccine is not recommended for use in pregnant females. However, pregnancy testing is not needed before receiving a dose.  Measles, mumps, rubella (MMR) vaccine.** / You need at least 1 dose of MMR if you were born in 1957 or later. You may also need a 2nd dose. For females of childbearing age, rubella immunity should be determined. If there is no evidence of immunity, females who are not pregnant should be vaccinated. If there is no evidence of immunity, females who are pregnant should delay immunization until after pregnancy.  Pneumococcal 13-valent conjugate (PCV13) vaccine.** / Consult your health care provider.  Pneumococcal polysaccharide (PPSV23) vaccine.** / 1 to 2 doses if you smoke cigarettes or if you have certain conditions.  Meningococcal vaccine.** / 1 dose if you are age 78 to 61 years and a Market researcher living in a residence hall, or have one of several medical conditions, you need to get vaccinated against meningococcal disease. You may also need additional booster doses.  Hepatitis A vaccine.** / Consult your health care provider.  Hepatitis B vaccine.** / Consult your health care provider.  Haemophilus influenzae type b (Hib) vaccine.** / Consult your health care provider. Ages 51 to 78 years  Blood pressure check.** / Every year.  Lipid and cholesterol check.** / Every 5 years beginning at age 51 years.  Lung cancer screening. / Every year if you are aged 3-80 years and have a 30-pack-year history of smoking and currently smoke or have quit within the past 15 years. Yearly screening is stopped once you have quit  smoking for at least 15 years or develop a health problem that would prevent you from having lung cancer treatment.  Clinical breast exam.** / Every year after age 32 years.  BRCA-related cancer risk assessment.** / For women who have family members with a BRCA-related cancer (breast,  ovarian, tubal, or peritoneal cancers).  Mammogram.** / Every year beginning at age 64 years and continuing for as long as you are in good health. Consult with your health care provider.  Pap test.** / Every 3 years starting at age 68 years through age 44 or 80 years with a history of 3 consecutive normal Pap tests.  HPV screening.** / Every 3 years from ages 59 years through ages 10 to 63 years with a history of 3 consecutive normal Pap tests.  Fecal occult blood test (FOBT) of stool. / Every year beginning at age 50 years and continuing until age 19 years. You may not need to do this test if you get a colonoscopy every 10 years.  Flexible sigmoidoscopy or colonoscopy.** / Every 5 years for a flexible sigmoidoscopy or every 10 years for a colonoscopy beginning at age 58 years and continuing until age 26 years.  Hepatitis C blood test.** / For all people born from [birth year] through 1964 and any individual with known risks for hepatitis C.  Skin self-exam. / Monthly.  Influenza vaccine. / Every year.  Tetanus, diphtheria, and acellular pertussis (Tdap/Td) vaccine.** / Consult your health care provider. Pregnant women should receive 1 dose of Tdap vaccine during each pregnancy. 1 dose of Td every 10 years.  Varicella vaccine.** / Consult your health care provider. Pregnant females who do not have evidence of immunity should receive the first dose after pregnancy.  Zoster vaccine.** / 1 dose for adults aged 65 years or older.  Measles, mumps, rubella (MMR) vaccine.** / You need at least 1 dose of MMR if you were born in 1957 or later. You may also need a second dose. For females of childbearing age, rubella immunity should be determined. If there is no evidence of immunity, females who are not pregnant should be vaccinated. If there is no evidence of immunity, females who are pregnant should delay immunization until after pregnancy.  Pneumococcal 13-valent conjugate (PCV13) vaccine.** / Consult  your health care provider.  Pneumococcal polysaccharide (PPSV23) vaccine.** / 1 to 2 doses if you smoke cigarettes or if you have certain conditions.  Meningococcal vaccine.** / Consult your health care provider.  Hepatitis A vaccine.** / Consult your health care provider.  Hepatitis B vaccine.** / Consult your health care provider.  Haemophilus influenzae type b (Hib) vaccine.** / Consult your health care provider. Ages 4 years and over  Blood pressure check.** / Every year.  Lipid and cholesterol check.** / Every 5 years beginning at age 77 years.  Lung cancer screening. / Every year if you are aged 21-80 years and have a 30-pack-year history of smoking and currently smoke or have quit within the past 15 years. Yearly screening is stopped once you have quit smoking for at least 15 years or develop a health problem that would prevent you from having lung cancer treatment.  Clinical breast exam.** / Every year after age 60 years.  BRCA-related cancer risk assessment.** / For women who have family members with a BRCA-related cancer (breast, ovarian, tubal, or peritoneal cancers).  Mammogram.** / Every year beginning at age 35 years and continuing for as long as you are in good health. Consult with your health care provider.  Pap test.** / Every 3 years starting at age 48 years through age 51 or 24 years with 3 consecutive normal Pap tests. Testing can be stopped between 65 and 70 years with 3 consecutive normal Pap tests and no abnormal Pap or HPV tests in the past 10 years.  HPV screening.** / Every 3 years from ages 33 years through ages 54 or 57 years with a history of 3 consecutive normal Pap tests. Testing can be stopped between 65 and 70 years with 3 consecutive normal Pap tests and no abnormal Pap or HPV tests in the past 10 years.  Fecal occult blood test (FOBT) of stool. / Every year beginning at age 29 years and continuing until age 78 years. You may not need to do this test  if you get a colonoscopy every 10 years.  Flexible sigmoidoscopy or colonoscopy.** / Every 5 years for a flexible sigmoidoscopy or every 10 years for a colonoscopy beginning at age 63 years and continuing until age 64 years.  Hepatitis C blood test.** / For all people born from [birth year] through 1964 and any individual with known risks for hepatitis C.  Osteoporosis screening.** / A one-time screening for women ages 74 years and over and women at risk for fractures or osteoporosis.  Skin self-exam. / Monthly.  Influenza vaccine. / Every year.  Tetanus, diphtheria, and acellular pertussis (Tdap/Td) vaccine.** / 1 dose of Td every 10 years.  Varicella vaccine.** / Consult your health care provider.  Zoster vaccine.** / 1 dose for adults aged 13 years or older.  Pneumococcal 13-valent conjugate (PCV13) vaccine.** / Consult your health care provider.  Pneumococcal polysaccharide (PPSV23) vaccine.** / 1 dose for all adults aged 96 years and older.  Meningococcal vaccine.** / Consult your health care provider.  Hepatitis A vaccine.** / Consult your health care provider.  Hepatitis B vaccine.** / Consult your health care provider.  Haemophilus influenzae type b (Hib) vaccine.** / Consult your health care provider. ** Family history and personal history of risk and conditions may change your health care provider's recommendations.   This information is not intended to replace advice given to you by your health care provider. Make sure you discuss any questions you have with your health care provider.   Document Released: 04/24/2001 Document Revised: 03/19/2014 Document Reviewed: 07/24/2010 Elsevier Interactive Patient Education [employer].

## 2015-12-14 NOTE — Assessment & Plan Note (Signed)
Watch BP with addition of OC's

## 2015-12-14 NOTE — Progress Notes (Signed)
   Subjective:    Patient ID: Karen Duncan is a 40 y.o. female presenting with Hypothyroidism and headaches  on 12/14/2015  HPI: Came in for low thyroid and had her syntroid increased from 88 to 100 mcg. Now on new dose x 4 wks. No improvement in fatigue and notes, aches, pains, headaches. Inability to lose weight. 20 lbs in 2 years. Desires something for weight loss.  Needs birth control.  Review of Systems  Constitutional: Positive for fatigue and unexpected weight change (weight gain). Negative for chills and fever.       Insomnia and hypersomnolence  Respiratory: Negative for shortness of breath.   Cardiovascular: Negative for chest pain.  Gastrointestinal: Negative for abdominal pain, nausea and vomiting.  Genitourinary: Negative for dysuria.  Musculoskeletal: Positive for myalgias.  Skin: Negative for rash.  Neurological: Positive for headaches.      Objective:    BP 133/83   Pulse 83   Temp 98.2 F (36.8 C) (Oral)   Ht 5\' 2"  (1.575 m)   Wt 164 lb 9.6 oz (74.7 kg)   LMP 11/25/2015 (Exact Date)   BMI 30.11 kg/m  Physical Exam  Constitutional: She is oriented to person, place, and time. She appears well-developed and well-nourished. No distress.  HENT:  Head: Normocephalic and atraumatic.  Eyes: No scleral icterus.  Neck: Neck supple.  Cardiovascular: Normal rate.   Pulmonary/Chest: Effort normal.  Abdominal: Soft.  Genitourinary:  Genitourinary Comments: BUS normal, vagina is pink and rugated, cervix is multiparous without lesion, uterus is small and anteverted, no adnexal mass or tenderness.   Neurological: She is alert and oriented to person, place, and time.  Skin: Skin is warm and dry.  Psychiatric: She has a normal mood and affect.        Assessment & Plan:   Problem List Items Addressed This Visit      High   Hypertension    Watch BP with addition of OC's        Medium   Hypothyroidism - Primary    Repeat testing and adjust medication as  needed.      Relevant Orders   TSH   T3, free   T4, free    Other Visit Diagnoses    Encounter for initial prescription of contraceptive pills       Relevant Medications   levonorgestrel-ethinyl estradiol (AVIANE,ALESSE,LESSINA) 0.1-20 MG-MCG tablet   Screening for cervical cancer       Relevant Orders   Cytology - PAP       Total face-to-face time with patient: 25 minutes. Over 50% of encounter was spent on counseling and coordination of care. Return in about 3 months (around 03/15/2016) for a follow-up.  Karen Duncan 12/14/2015 8:57 AM

## 2015-12-16 LAB — CYTOLOGY - PAP

## 2016-01-19 ENCOUNTER — Ambulatory Visit: Payer: BLUE CROSS/BLUE SHIELD | Admitting: Family Medicine

## 2016-01-31 IMAGING — US US OB FOLLOW-UP
3 series · 12 of 28 positions shown · non-contrast
Comparison: none

[Series 1: us ob follow up · 2 of 10 slices shown (1 of 3)]
[im 4/10]
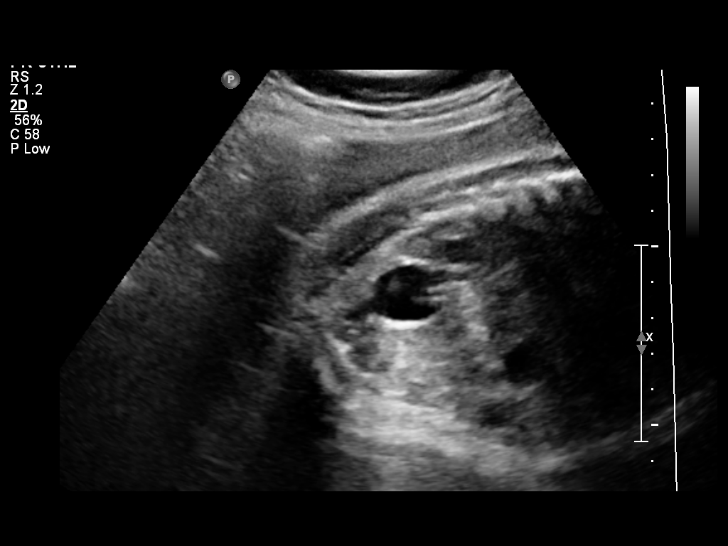
[im 10/10]
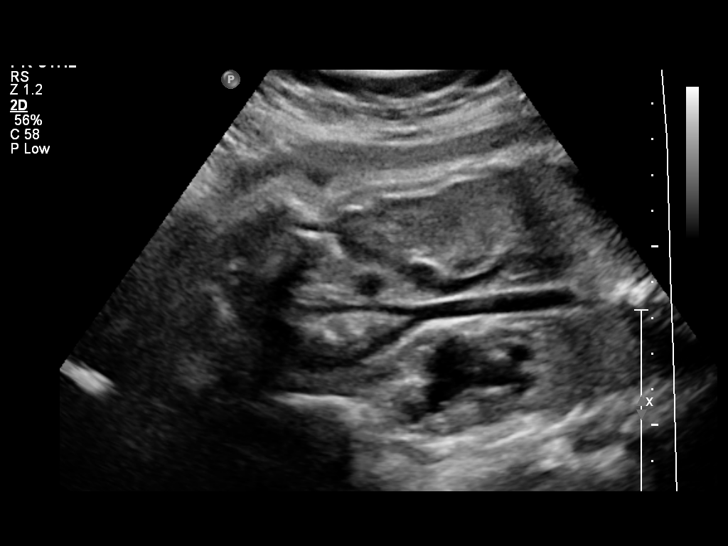

[Series 1: us ob follow up · 48 acquisitions, 9 frames shown (2 of 3)]
[im 3/48]
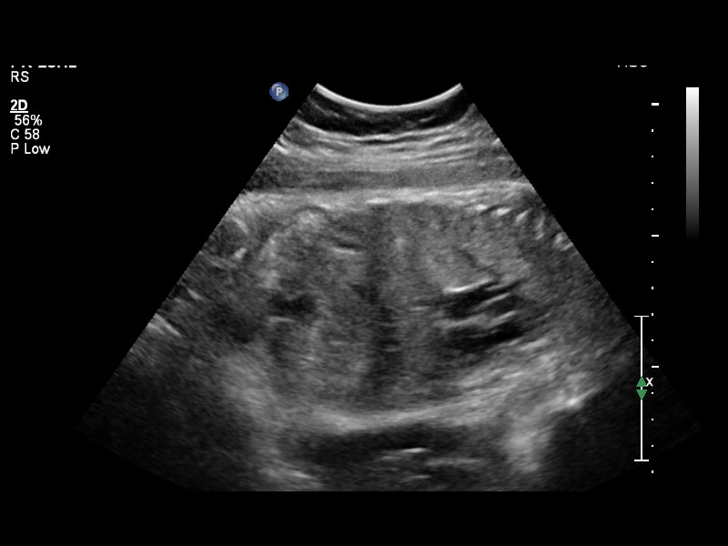
[im 10/48]
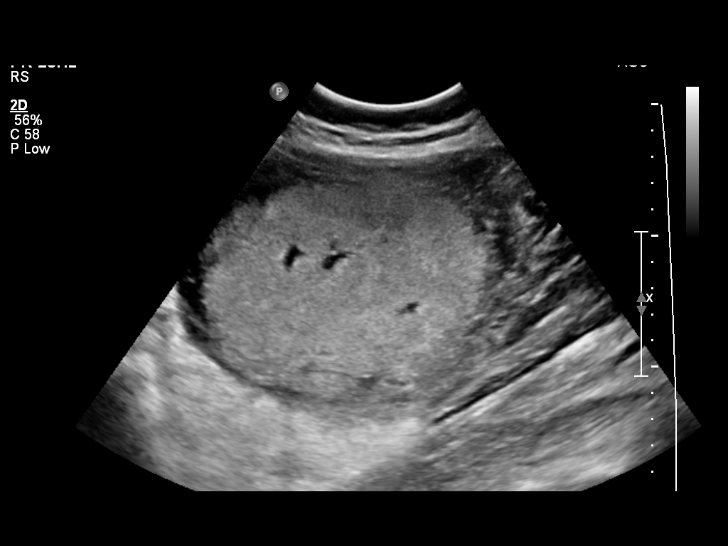
[im 15/48]
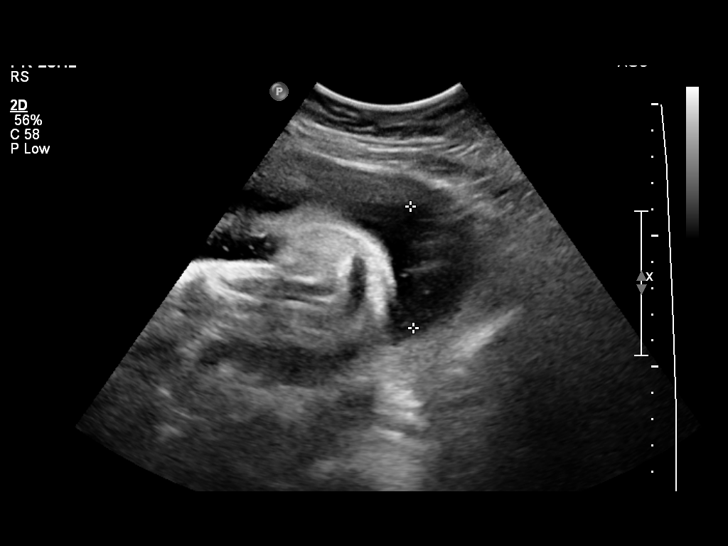
[im 19/48]
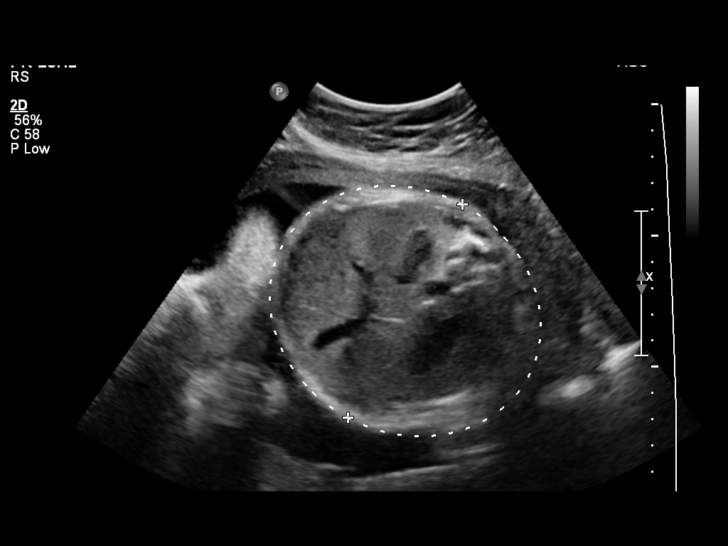
[im 26/48]
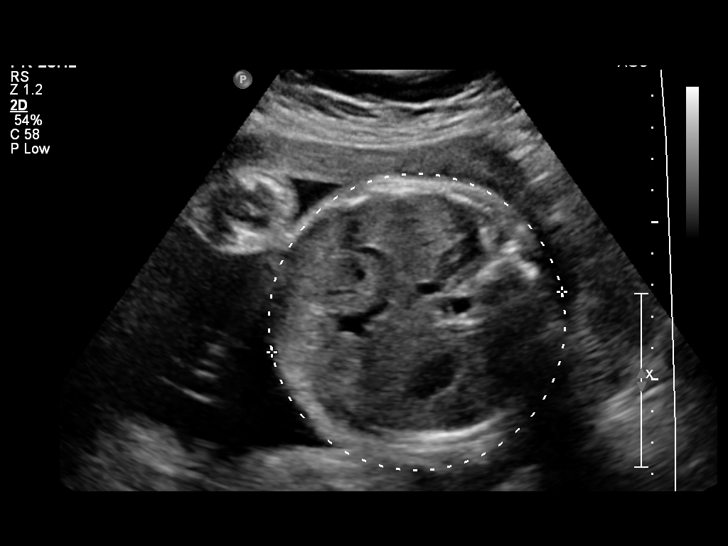
[im 31/48]
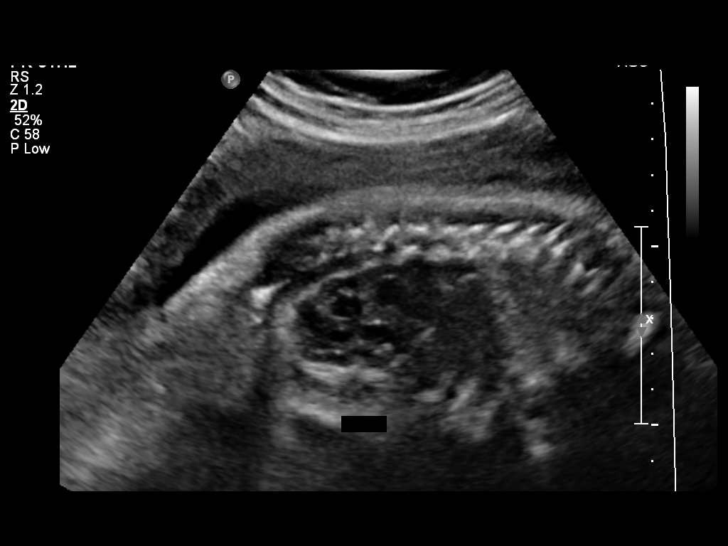
[im 36/48]
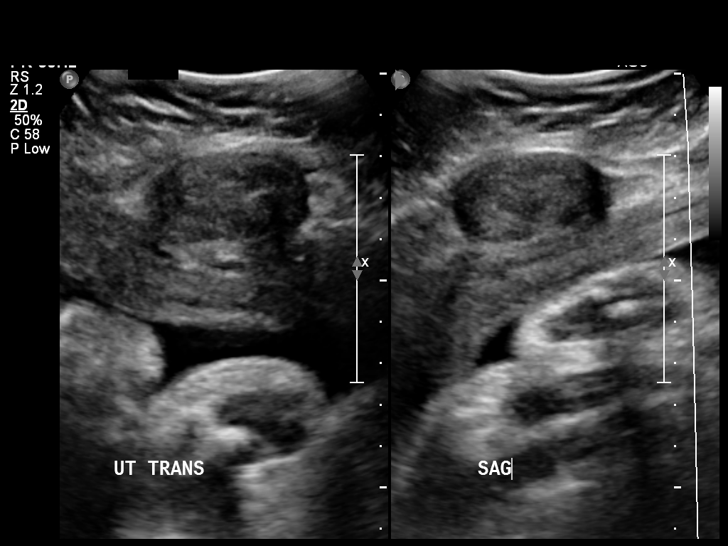
[im 43/48]
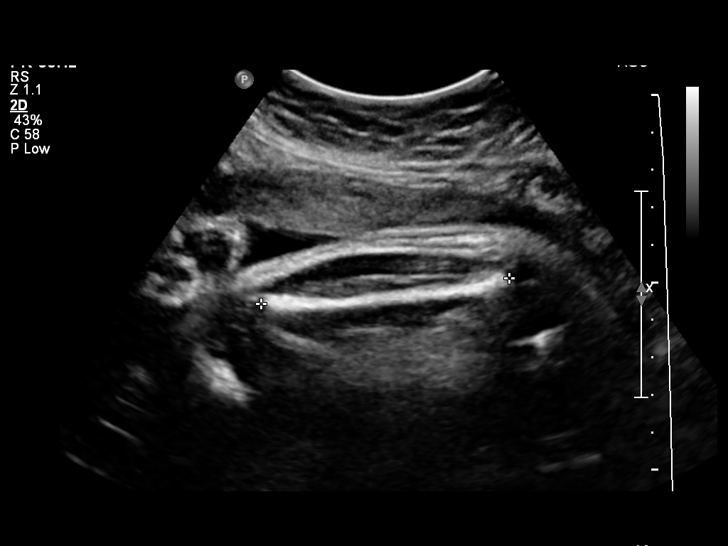
[im 48/48]
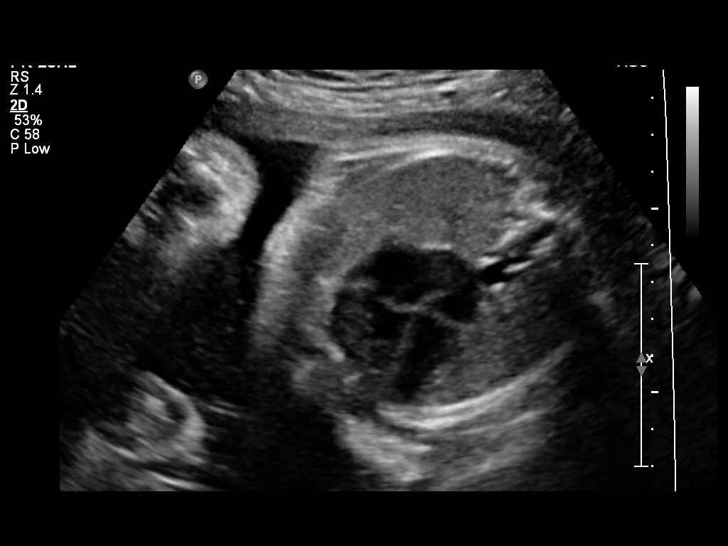

[Series 1: us ob follow up · 1 of 6 slices shown (3 of 3)]
[im 3/6]
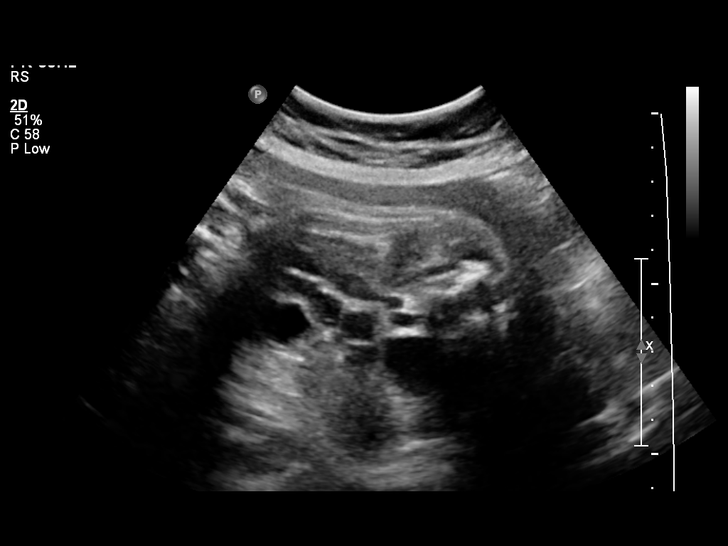

[12 of 28 positions shown; findings below may reference images not displayed]

OBSTETRICS REPORT
                      (Signed Final 05/14/2013 [DATE])

Service(s) Provided

 US OB FOLLOW UP                                       76816.1
Indications

 Hypertension - Chronic/Pre-existing
 Hypothyroid
 Uterine fibroids
 Advanced maternal age (AMA)
 Pyelectasis of fetus on prenatal ultrasound           655.83 593.89,

Fetal Evaluation

 Num Of Fetuses:    1
 Fetal Heart Rate:  136                          bpm
 Cardiac Activity:  Observed
 Presentation:      Cephalic
 Placenta:          Posterior Fundal, above
                    cervical os
 P. Cord            Previously Visualized
 Insertion:

 Amniotic Fluid
 AFI FV:      Subjectively within normal limits
 AFI Sum:     15.87   cm       58  %Tile     Larg Pckt:    4.18  cm
 RUQ:   4.18    cm   RLQ:    3.7    cm    LUQ:   3.99    cm   LLQ:    4      cm
Biometry

 BPD:     86.1  mm     G. Age:  34w 5d                CI:        80.36   70 - 86
                                                      FL/HC:      21.7   20.1 -

 HC:     303.4  mm     G. Age:  33w 5d      < 3  %    HC/AC:      1.01   0.93 -

 AC:     301.5  mm     G. Age:  34w 1d       22  %    FL/BPD:     76.5   71 - 87
 FL:      65.9  mm     G. Age:  34w 0d       12  %    FL/AC:      21.9   20 - 24

 Est. FW:    1761  gm      5 lb 3 oz     32  %
Gestational Age

 LMP:           38w 1d        Date:  08/20/12                 EDD:   05/27/13
 U/S Today:     34w 1d                                        EDD:   06/24/13
 Best:          35w 3d     Det. By:  U/S C R L (10/23/12)     EDD:   06/15/13
Anatomy

 Cranium:          Appears normal         Aortic Arch:      Previously seen
 Fetal Cavum:      Previously seen        Ductal Arch:      Previously seen
 Ventricles:       Previously seen        Diaphragm:        Previously seen
 Choroid Plexus:   Previously seen        Stomach:          Appears normal, left
                                                            sided
 Cerebellum:       Previously seen        Abdomen:          Previously seen
 Posterior Fossa:  Previously seen        Abdominal Wall:   Previously seen
 Nuchal Fold:      Previously seen        Cord Vessels:     Previously seen
 Face:             Orbits and profile     Kidneys:          Left sided
                   previously seen
                                                            pyelectasis,
                                                            14.3mm
 Lips:             Previously seen        Bladder:          Appears normal
 Heart:            Appears normal         Spine:            Previously seen
                   (4CH, axis, and
                   situs)
 RVOT:             Previously seen        Lower             Previously seen
                                          Extremities:
 LVOT:             Previously seen        Upper             Previously seen
                                          Extremities:

 Other:  Male gender. Heels previously visualized. Nasal bone previously
         visualized.
Cervix Uterus Adnexa

 Cervix:       Not visualized (advanced GA >34 wks)

 Adnexa:     No abnormality visualized.
Impression

 Single IUP at 35 [DATE] weeks
 Grade 2-3 left hydronephrosis is noted with splaying of the
 renal pelvis and some calyces
 ? left duplicated collecting system
 The right kidney appears normal
 Normal amniotic fluid volume
 Interval growth is appropriate (32nd %tile)
 Normal amniotic fluid volume

 Ultrasound findings and limitations of the study were
 discussed with the patient.  She will likely require Peds
 Urology evaluatoin during the first several weeks of life.
Recommendations

 Will try to arrange Peds Urology consultation prior to delivery,
 but may be difficult given current gestational age
 Notify Peds at time of delivery - will need neonatal evaluaition
 and likely Peds Urology consultation.

 questions or concerns.

## 2016-02-08 ENCOUNTER — Other Ambulatory Visit: Payer: Self-pay | Admitting: Family Medicine

## 2016-03-26 ENCOUNTER — Encounter: Payer: Self-pay | Admitting: Obstetrics and Gynecology

## 2016-03-26 ENCOUNTER — Ambulatory Visit (INDEPENDENT_AMBULATORY_CARE_PROVIDER_SITE_OTHER): Payer: BLUE CROSS/BLUE SHIELD | Admitting: Obstetrics and Gynecology

## 2016-03-26 VITALS — BP 110/80 | HR 84 | Temp 98.7°F | Ht 62.0 in | Wt 167.0 lb

## 2016-03-26 DIAGNOSIS — I10 Essential (primary) hypertension: Secondary | ICD-10-CM

## 2016-03-26 DIAGNOSIS — E030 Congenital hypothyroidism with diffuse goiter: Secondary | ICD-10-CM | POA: Diagnosis not present

## 2016-03-26 DIAGNOSIS — F3289 Other specified depressive episodes: Secondary | ICD-10-CM | POA: Diagnosis not present

## 2016-03-26 LAB — T3, FREE: T3 FREE: 2.5 pg/mL (ref 2.3–4.2)

## 2016-03-26 LAB — T4, FREE: FREE T4: 1.1 ng/dL (ref 0.8–1.8)

## 2016-03-26 LAB — TSH: TSH: 6.04 m[IU]/L — AB

## 2016-03-26 NOTE — Assessment & Plan Note (Signed)
Blood pressure within normal limits today. Reviewed blood pressures over last couple years and all have been normal. Goal bloodr pressure would be less than 140/90. Elevated blood pressures seem to be only at home with home monitor. Could be faulty monitor. Also could be due to pain from headaches/migraines patient has and then checking blood pressure. Plan is for patient to keep blood pressure log daily for the next 2 weeks. She will follow-up in 2 weeks. Will hold off on starting medication at this time until can review log. Also can reevaluate starting OCPs at that time. Patient agreeable to plan.

## 2016-03-26 NOTE — Progress Notes (Deleted)
Hypertension Blood pressure at home: 144/87 at home, has gottent o 170/ hoem digital monitor Blood pressure today:  Taking Meds: not taking anything for medications Side effects: ROS: Denies headache, dizziness, visual changes, nausea, vomiting, chest pain, abdominal pain or shortness of breath.  Gets headaches and knows BP might be up  Hands tingling and swetaing a lot  Hypothyroidism Thinks doctor said she had to much medication Decreased dose to 183mcg prior was 88 Having increased weight gain Never been on a dose that works Denies fatigue

## 2016-03-26 NOTE — Assessment & Plan Note (Signed)
Stable at this time. PHQ-9 today 10. Patient denies any difficulty at this time. Continue Zoloft at current dose.

## 2016-03-26 NOTE — Patient Instructions (Signed)
Will contact you about labs. Continue synthroid dose for now We agreed to hold off on BP medication: Keep a BP log over the next 2 weeks. Record BPs daily at various times of the day Continue to hold off on birth control Get back active  Follow-up in 2 weeks

## 2016-03-26 NOTE — Assessment & Plan Note (Signed)
Well-controlled at this time. Patient however continues to have weight gain. Unsure if this is due to thyroid dysfunction vs excess calories. Will repeat testing today and adjust medication as needed.

## 2016-03-26 NOTE — Progress Notes (Signed)
     Subjective: Chief Complaint  Patient presents with  . Follow-up    hypertension and thyroid     HPI: Karen Duncan is a 41 y.o. presenting to clinic today to discuss the following:  #Hypertension Blood pressure at home: checks with digital monitor. Has gotten values at home ranging 123XX123 to systolic 123XX123 Usually checks BP when has a headache Blood pressure today: wnl Stopepd taking OCPs due to BP as recommended by PCP Taking Meds: not taking anything ROS: Endorses headaches,  Denies dizziness, visual changes, nausea, vomiting, chest pain, abdominal pain or shortness of breath.  #Hypothyroidism, Congential  Was told to come have thyroid rechecked Most recent dose change was from 38mcg to 126mcg Since dose change patient states symptoms are improved Denies fatigue or cold intolerence Continues to have increased weight gain  #Depression States that her weight gain is causing her to be depressed Denies SI/HI Currently taking Zoloft, feels this is a good dose for her   ROS noted in HPI.  Past Medical, Surgical, Social, and Family History Reviewed & Updated per EMR. History  Smoking Status  . Never Smoker  Smokeless Tobacco  . Never Used    Objective: BP 110/80 (BP Location: Left Arm, Patient Position: Sitting, Cuff Size: Normal)   Pulse 84   Temp 98.7 F (37.1 C) (Oral)   Ht 5\' 2"  (1.575 m)   Wt 167 lb (75.8 kg)   LMP 01/18/2016 (Exact Date)   SpO2 98%   Breastfeeding? No   BMI 30.54 kg/m  Vitals and nursing notes reviewed  Physical Exam  Constitutional: She is well-developed, well-nourished, and in no distress.  Eyes: EOM are normal. Pupils are equal, round, and reactive to light.  Neck: Normal range of motion. Neck supple. No thyromegaly present.  Cardiovascular: Normal rate, regular rhythm and normal heart sounds.   Pulmonary/Chest: Effort normal and breath sounds normal.  Psychiatric: Mood and affect normal.    Depression screen [facility] 2/9  03/26/2016 03/26/2016  Decreased Interest 1 1  Down, Depressed, Hopeless 2 1  PHQ - 2 Score 3 2  Altered sleeping 3 -  Tired, decreased energy 1 -  Change in appetite 0 -  Feeling bad or failure about yourself  3 -  Trouble concentrating 0 -  Moving slowly or fidgety/restless 0 -  Suicidal thoughts 0 -  PHQ-9 Score 10 -  Difficult doing work/chores Not difficult at all -    Assessment/Plan: Please see problem based Assessment and Plan PATIENT EDUCATION PROVIDED: See AVS    Orders Placed This Encounter  Procedures  . TSH  . T3, Free  . T4, free     Luiz Blare, DO 03/26/2016, 8:39 AM PGY-3, [employer]

## 2016-03-27 ENCOUNTER — Other Ambulatory Visit: Payer: Self-pay | Admitting: Obstetrics and Gynecology

## 2016-03-27 MED ORDER — LEVOTHYROXINE SODIUM 112 MCG PO TABS: 112.0000 ug | ORAL_TABLET | Freq: Every day | ORAL | 1 refills | Status: DC

## 2016-04-14 ENCOUNTER — Encounter: Payer: Self-pay | Admitting: Obstetrics and Gynecology

## 2016-04-23 MED ORDER — LEVOTHYROXINE SODIUM 112 MCG PO TABS: 112.0000 ug | ORAL_TABLET | Freq: Every day | ORAL | 1 refills | Status: DC

## 2016-05-18 DIAGNOSIS — R635 Abnormal weight gain: Secondary | ICD-10-CM | POA: Diagnosis not present

## 2016-05-18 DIAGNOSIS — N951 Menopausal and female climacteric states: Secondary | ICD-10-CM | POA: Diagnosis not present

## 2016-05-24 DIAGNOSIS — E039 Hypothyroidism, unspecified: Secondary | ICD-10-CM | POA: Diagnosis not present

## 2016-05-24 DIAGNOSIS — E7800 Pure hypercholesterolemia, unspecified: Secondary | ICD-10-CM | POA: Diagnosis not present

## 2016-05-24 DIAGNOSIS — N951 Menopausal and female climacteric states: Secondary | ICD-10-CM | POA: Diagnosis not present

## 2016-05-24 DIAGNOSIS — E8881 Metabolic syndrome: Secondary | ICD-10-CM | POA: Diagnosis not present

## 2016-05-24 DIAGNOSIS — E538 Deficiency of other specified B group vitamins: Secondary | ICD-10-CM | POA: Diagnosis not present

## 2016-05-30 DIAGNOSIS — E8881 Metabolic syndrome: Secondary | ICD-10-CM | POA: Diagnosis not present

## 2016-05-30 DIAGNOSIS — E6609 Other obesity due to excess calories: Secondary | ICD-10-CM | POA: Diagnosis not present

## 2016-05-30 DIAGNOSIS — E039 Hypothyroidism, unspecified: Secondary | ICD-10-CM | POA: Diagnosis not present

## 2016-05-30 DIAGNOSIS — E538 Deficiency of other specified B group vitamins: Secondary | ICD-10-CM | POA: Diagnosis not present

## 2016-06-06 DIAGNOSIS — E8881 Metabolic syndrome: Secondary | ICD-10-CM | POA: Diagnosis not present

## 2016-06-06 DIAGNOSIS — E039 Hypothyroidism, unspecified: Secondary | ICD-10-CM | POA: Diagnosis not present

## 2016-06-06 DIAGNOSIS — E538 Deficiency of other specified B group vitamins: Secondary | ICD-10-CM | POA: Diagnosis not present

## 2016-06-06 DIAGNOSIS — E6609 Other obesity due to excess calories: Secondary | ICD-10-CM | POA: Diagnosis not present

## 2016-06-13 DIAGNOSIS — E6609 Other obesity due to excess calories: Secondary | ICD-10-CM | POA: Diagnosis not present

## 2016-06-13 DIAGNOSIS — E7800 Pure hypercholesterolemia, unspecified: Secondary | ICD-10-CM | POA: Diagnosis not present

## 2016-06-13 DIAGNOSIS — E538 Deficiency of other specified B group vitamins: Secondary | ICD-10-CM | POA: Diagnosis not present

## 2016-06-13 DIAGNOSIS — E8881 Metabolic syndrome: Secondary | ICD-10-CM | POA: Diagnosis not present

## 2016-06-21 DIAGNOSIS — E538 Deficiency of other specified B group vitamins: Secondary | ICD-10-CM | POA: Diagnosis not present

## 2016-06-21 DIAGNOSIS — E8881 Metabolic syndrome: Secondary | ICD-10-CM | POA: Diagnosis not present

## 2016-06-21 DIAGNOSIS — E663 Overweight: Secondary | ICD-10-CM | POA: Diagnosis not present

## 2016-06-21 DIAGNOSIS — E7800 Pure hypercholesterolemia, unspecified: Secondary | ICD-10-CM | POA: Diagnosis not present

## 2016-06-26 DIAGNOSIS — E7800 Pure hypercholesterolemia, unspecified: Secondary | ICD-10-CM | POA: Diagnosis not present

## 2016-06-26 DIAGNOSIS — E8881 Metabolic syndrome: Secondary | ICD-10-CM | POA: Diagnosis not present

## 2016-06-26 DIAGNOSIS — E663 Overweight: Secondary | ICD-10-CM | POA: Diagnosis not present

## 2016-06-26 DIAGNOSIS — E538 Deficiency of other specified B group vitamins: Secondary | ICD-10-CM | POA: Diagnosis not present

## 2016-06-28 ENCOUNTER — Other Ambulatory Visit: Payer: Self-pay | Admitting: Family Medicine

## 2016-07-05 DIAGNOSIS — E538 Deficiency of other specified B group vitamins: Secondary | ICD-10-CM | POA: Diagnosis not present

## 2016-07-05 DIAGNOSIS — E663 Overweight: Secondary | ICD-10-CM | POA: Diagnosis not present

## 2016-07-05 DIAGNOSIS — E039 Hypothyroidism, unspecified: Secondary | ICD-10-CM | POA: Diagnosis not present

## 2016-07-05 DIAGNOSIS — E7800 Pure hypercholesterolemia, unspecified: Secondary | ICD-10-CM | POA: Diagnosis not present

## 2016-07-13 DIAGNOSIS — E663 Overweight: Secondary | ICD-10-CM | POA: Diagnosis not present

## 2016-07-13 DIAGNOSIS — E7800 Pure hypercholesterolemia, unspecified: Secondary | ICD-10-CM | POA: Diagnosis not present

## 2016-07-13 DIAGNOSIS — E8881 Metabolic syndrome: Secondary | ICD-10-CM | POA: Diagnosis not present

## 2016-07-13 DIAGNOSIS — R5383 Other fatigue: Secondary | ICD-10-CM | POA: Diagnosis not present

## 2016-07-18 DIAGNOSIS — E8881 Metabolic syndrome: Secondary | ICD-10-CM | POA: Diagnosis not present

## 2016-07-18 DIAGNOSIS — E663 Overweight: Secondary | ICD-10-CM | POA: Diagnosis not present

## 2016-07-18 DIAGNOSIS — E7800 Pure hypercholesterolemia, unspecified: Secondary | ICD-10-CM | POA: Diagnosis not present

## 2016-07-23 ENCOUNTER — Other Ambulatory Visit: Payer: Self-pay | Admitting: Family Medicine

## 2016-07-31 DIAGNOSIS — E7800 Pure hypercholesterolemia, unspecified: Secondary | ICD-10-CM | POA: Diagnosis not present

## 2016-07-31 DIAGNOSIS — E8881 Metabolic syndrome: Secondary | ICD-10-CM | POA: Diagnosis not present

## 2016-07-31 DIAGNOSIS — E663 Overweight: Secondary | ICD-10-CM | POA: Diagnosis not present

## 2016-07-31 DIAGNOSIS — E559 Vitamin D deficiency, unspecified: Secondary | ICD-10-CM | POA: Diagnosis not present

## 2016-08-08 DIAGNOSIS — E663 Overweight: Secondary | ICD-10-CM | POA: Diagnosis not present

## 2016-08-08 DIAGNOSIS — E039 Hypothyroidism, unspecified: Secondary | ICD-10-CM | POA: Diagnosis not present

## 2016-08-08 DIAGNOSIS — E559 Vitamin D deficiency, unspecified: Secondary | ICD-10-CM | POA: Diagnosis not present

## 2016-08-08 DIAGNOSIS — K219 Gastro-esophageal reflux disease without esophagitis: Secondary | ICD-10-CM | POA: Diagnosis not present

## 2016-08-15 DIAGNOSIS — R635 Abnormal weight gain: Secondary | ICD-10-CM | POA: Diagnosis not present

## 2016-08-22 DIAGNOSIS — E663 Overweight: Secondary | ICD-10-CM | POA: Diagnosis not present

## 2016-08-22 DIAGNOSIS — E039 Hypothyroidism, unspecified: Secondary | ICD-10-CM | POA: Diagnosis not present

## 2016-08-22 DIAGNOSIS — E8881 Metabolic syndrome: Secondary | ICD-10-CM | POA: Diagnosis not present

## 2016-08-22 DIAGNOSIS — E7800 Pure hypercholesterolemia, unspecified: Secondary | ICD-10-CM | POA: Diagnosis not present

## 2016-09-15 ENCOUNTER — Other Ambulatory Visit: Payer: Self-pay | Admitting: Family Medicine

## 2016-11-24 ENCOUNTER — Other Ambulatory Visit: Payer: Self-pay | Admitting: Family Medicine

## 2017-02-04 ENCOUNTER — Other Ambulatory Visit: Payer: Self-pay | Admitting: Obstetrics and Gynecology

## 2017-02-21 ENCOUNTER — Other Ambulatory Visit: Payer: Self-pay | Admitting: *Deleted

## 2017-02-21 MED ORDER — SERTRALINE HCL 100 MG PO TABS: 100.0000 mg | ORAL_TABLET | Freq: Every day | ORAL | 1 refills | Status: DC

## 2017-02-21 NOTE — Telephone Encounter (Signed)
Patient is requesting a 90 day supply of this medication.  Will forward to MD to approve.  Rosilyn Coachman,CMA

## 2017-03-07 ENCOUNTER — Other Ambulatory Visit: Payer: Self-pay | Admitting: *Deleted

## 2017-03-07 MED ORDER — LEVOTHYROXINE SODIUM 112 MCG PO TABS: 112.0000 ug | ORAL_TABLET | Freq: Every day | ORAL | 1 refills | Status: DC

## 2017-04-23 ENCOUNTER — Encounter: Payer: Self-pay | Admitting: Family Medicine

## 2017-04-23 DIAGNOSIS — E031 Congenital hypothyroidism without goiter: Secondary | ICD-10-CM

## 2017-04-24 MED ORDER — LEVOTHYROXINE SODIUM 112 MCG PO TABS: 112.0000 ug | ORAL_TABLET | Freq: Every day | ORAL | 1 refills | Status: DC

## 2017-05-23 ENCOUNTER — Ambulatory Visit: Payer: BLUE CROSS/BLUE SHIELD | Admitting: Obstetrics & Gynecology

## 2017-06-05 IMAGING — US US PELVIS COMPLETE
1 series · 14 of 25 positions shown · non-contrast
Comparison: None

CLINICAL DATA: Evaluate for fibroid.

EXAM:
TRANSABDOMINAL AND TRANSVAGINAL ULTRASOUND OF PELVIS
TECHNIQUE: Both transabdominal and transvaginal ultrasound examinations of the
pelvis were performed. Transabdominal technique was performed for
global imaging of the pelvis including uterus, ovaries, adnexal
regions, and pelvic cul-de-sac. It was necessary to proceed with
endovaginal exam following the transabdominal exam to visualize the
endometrium and ovaries..

[Series 1: us pelvis complete · 14 of 47 slices shown]
[im 1/47]
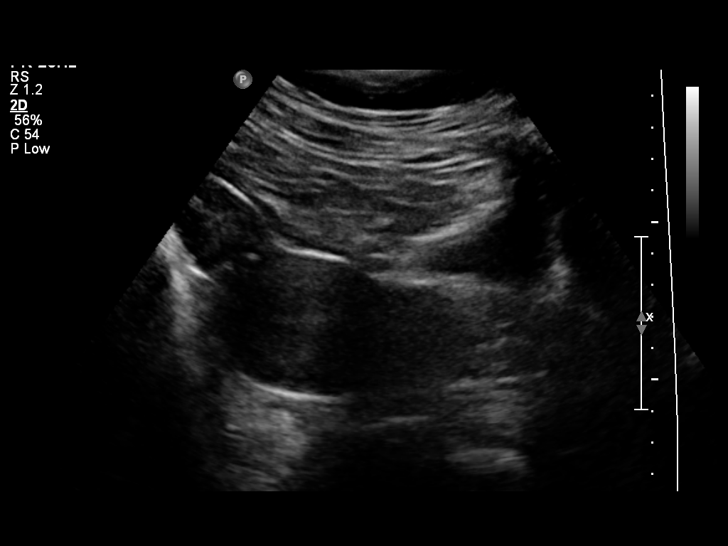
[im 4/47]
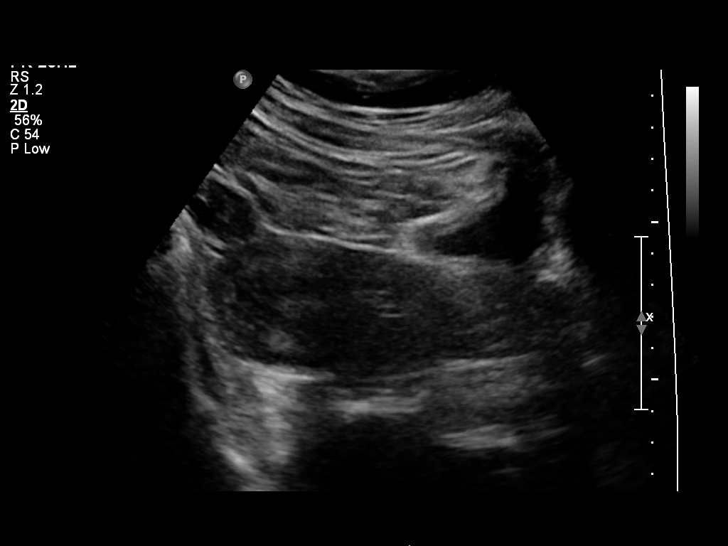
[im 8/47]
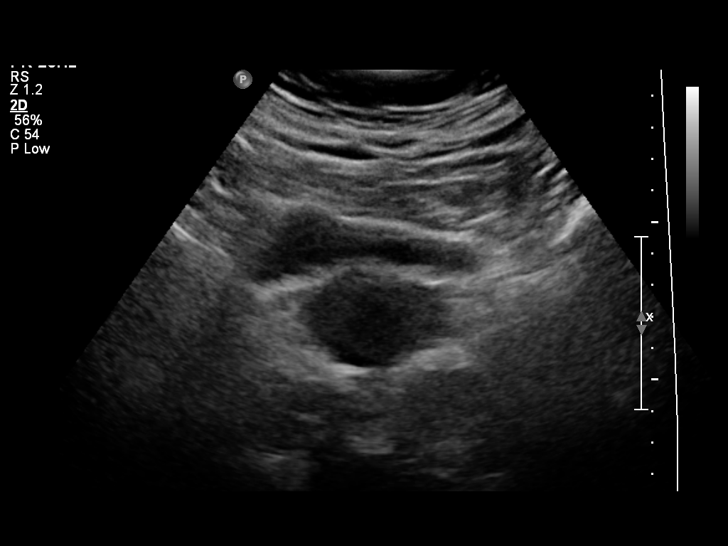
[im 12/47]
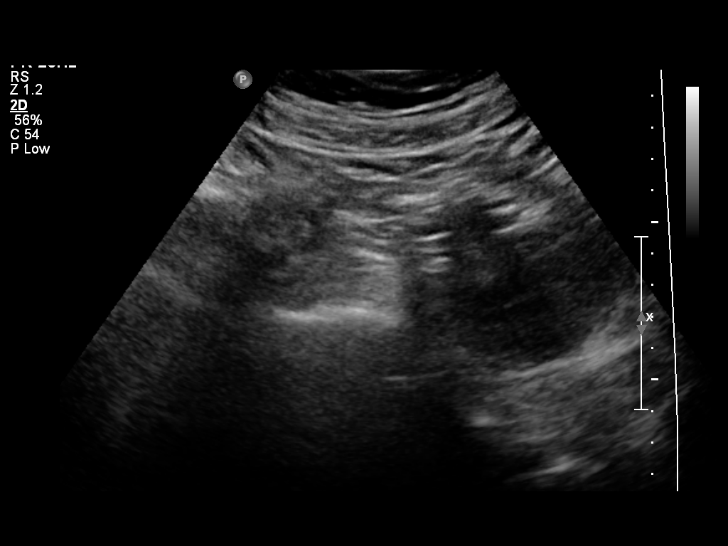
[im 16/47]
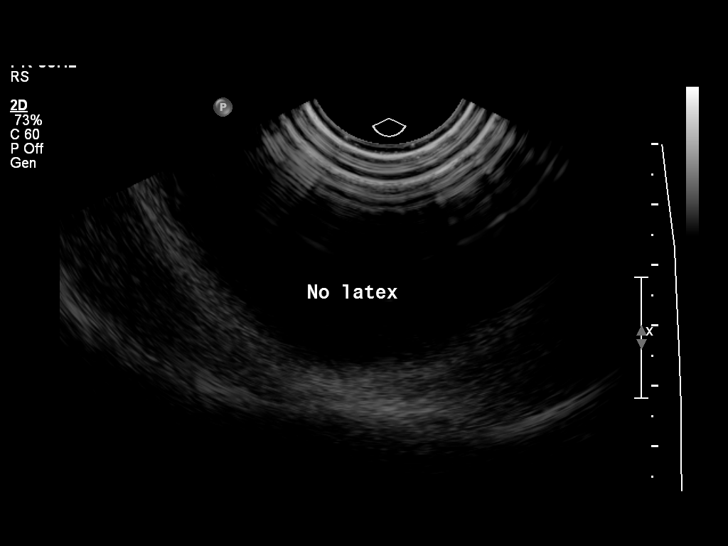
[im 18/47]
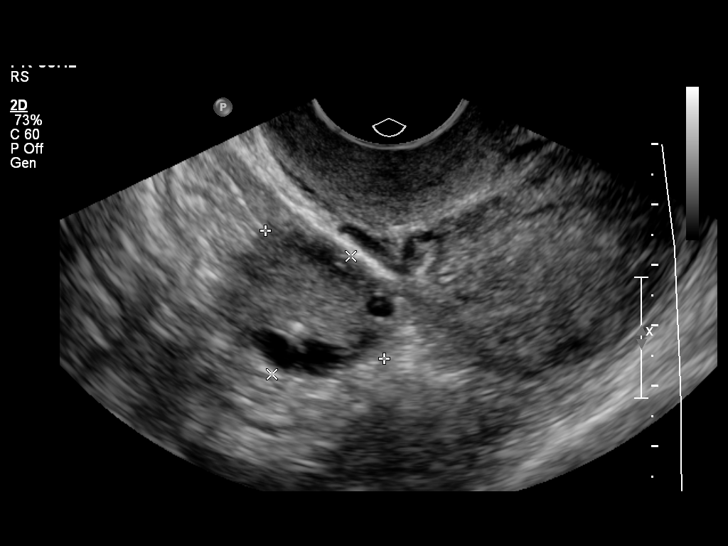
[im 22/47]
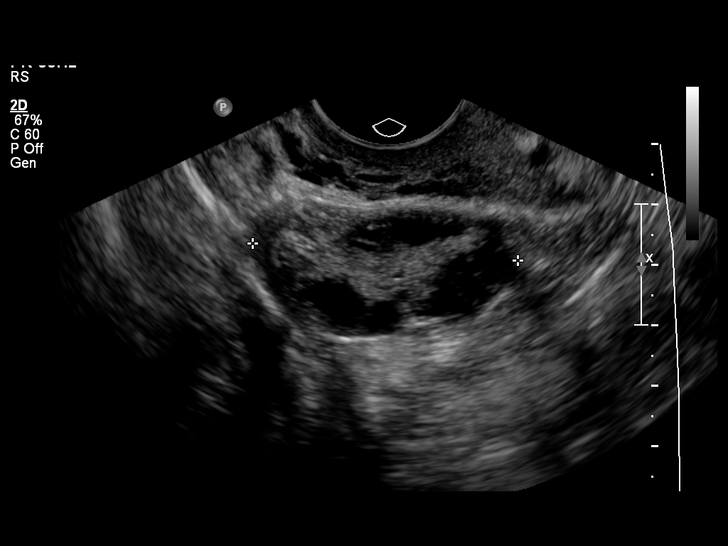
[im 25/47]
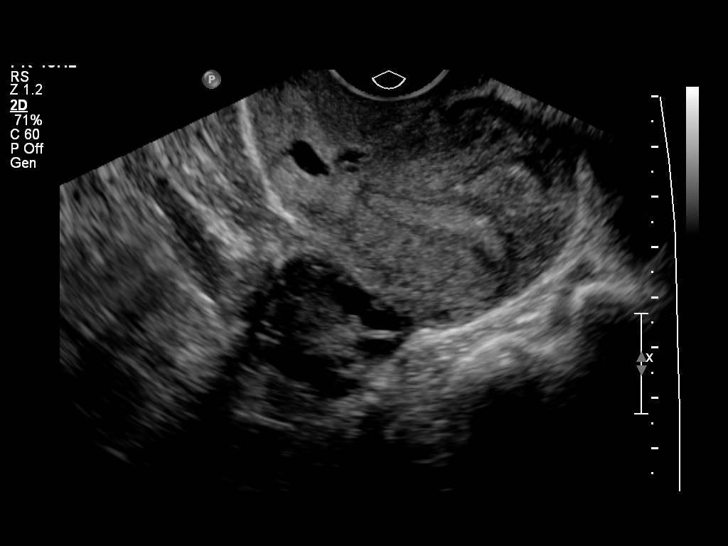
[im 29/47]
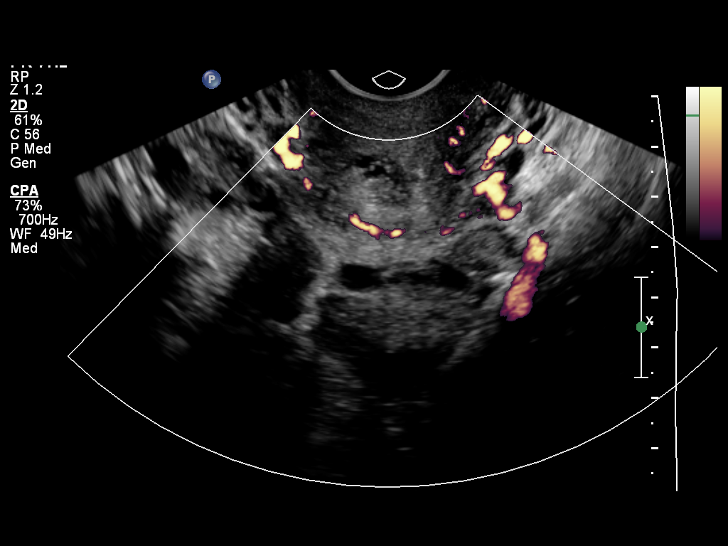
[im 31/47]
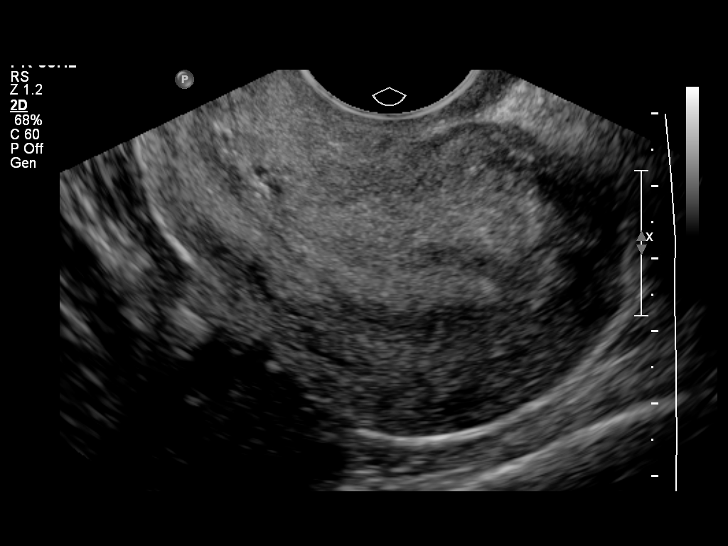
[im 35/47]
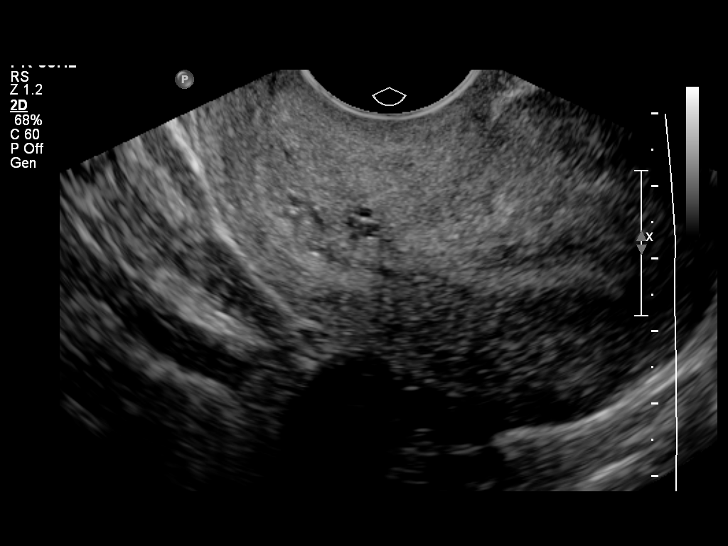
[im 39/47]
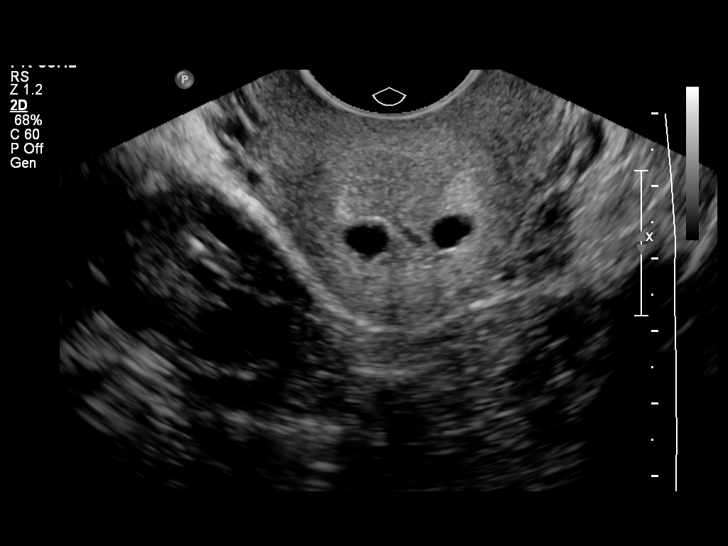
[im 43/47]
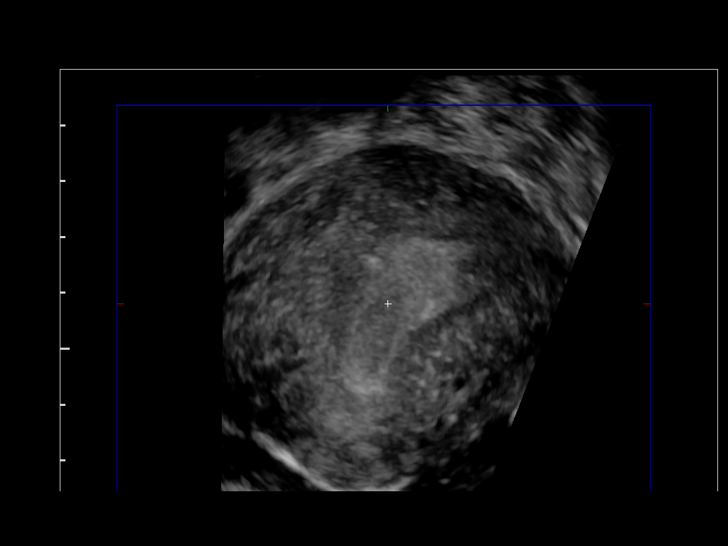
[im 47/47]
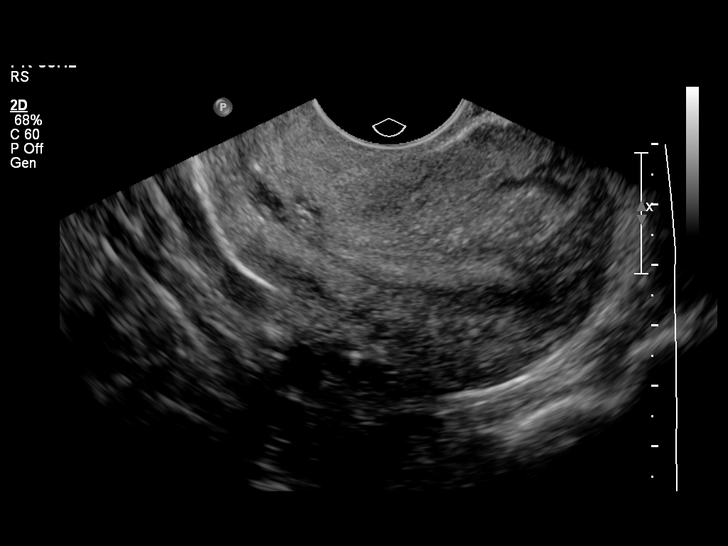

[14 of 25 positions shown; findings below may reference images not displayed]

FINDINGS: Uterus

Measurements: 7.5 x 4.2 x 7.1 cm.. No fibroids or other mass
visualized.

Endometrium

Thickness: 7 mm.  No focal abnormality visualized.

Right ovary

Measurements: 2.9 x 2.4 x 4.4 cm. Normal appearance/no adnexal mass.

Left ovary

Measurements: 3.1 x 2.9 x 3.8 cm. Normal appearance/no adnexal mass.

Other findings

No free fluid.
IMPRESSION: 1. Normal pelvic sonogram.
2. No fibroids identified.

## 2017-06-11 ENCOUNTER — Other Ambulatory Visit: Payer: Self-pay | Admitting: Obstetrics and Gynecology

## 2017-06-11 ENCOUNTER — Ambulatory Visit (INDEPENDENT_AMBULATORY_CARE_PROVIDER_SITE_OTHER): Payer: BLUE CROSS/BLUE SHIELD | Admitting: Obstetrics and Gynecology

## 2017-06-11 ENCOUNTER — Encounter: Payer: Self-pay | Admitting: Obstetrics and Gynecology

## 2017-06-11 VITALS — BP 139/81 | HR 91 | Ht 62.0 in | Wt 153.2 lb

## 2017-06-11 DIAGNOSIS — Z01419 Encounter for gynecological examination (general) (routine) without abnormal findings: Secondary | ICD-10-CM

## 2017-06-11 DIAGNOSIS — E039 Hypothyroidism, unspecified: Secondary | ICD-10-CM | POA: Diagnosis not present

## 2017-06-11 DIAGNOSIS — Z0000 Encounter for general adult medical examination without abnormal findings: Secondary | ICD-10-CM

## 2017-06-11 DIAGNOSIS — N644 Mastodynia: Secondary | ICD-10-CM

## 2017-06-11 DIAGNOSIS — I10 Essential (primary) hypertension: Secondary | ICD-10-CM | POA: Diagnosis not present

## 2017-06-11 LAB — POCT PREGNANCY, URINE: Preg Test, Ur: NEGATIVE

## 2017-06-11 MED ORDER — AMLODIPINE BESYLATE 2.5 MG PO TABS: 2.5000 mg | ORAL_TABLET | Freq: Every day | ORAL | 1 refills | Status: DC

## 2017-06-11 MED ORDER — NORGESTIMATE-ETH ESTRADIOL 0.25-35 MG-MCG PO TABS
1.0000 | ORAL_TABLET | Freq: Every day | ORAL | 11 refills | Status: DC
Start: 2017-06-11 — End: 2017-08-01

## 2017-06-11 NOTE — Patient Instructions (Addendum)
Follow-up at the [facility] for further primary care needs so they can continue to monitor your high blood pressure and thyroid disorder.   Things to do to Keep yourself Healthy - Exercise at least 30-45 minutes a day,  3-4 days a week.  - Eat a low-fat diet with lots of fruits and vegetables, up to 7-9 servings per day. - Seatbelts can save your life. Wear them always. - Smoke detectors on every level of your home, check batteries every year. - Eye Doctor - have an eye exam every 1-2 years - Safe sex - if you may be exposed to STDs, use a condom. - Alcohol If you drink, do it moderately,less than 2 drinks per day. - [place].  Choose someone to speak for you if you are not able. - Depression is common in our stressful world.If you're feeling down or losing interest in things you normally enjoy, please come in for a visit. - Violence - If anyone is threatening or hurting you, please call immediately.

## 2017-06-11 NOTE — Progress Notes (Signed)
   GYNECOLOGY OFFICE VISIT NOTE  History:  42 y.o. G3P1011 here today for annual phyisical. Would like to have her thyroid labs checked since they have not been checked in a year. Denies symptoms of hypothyroidism.  Also endorsing some left breast tenderness. She denies any abnormal vaginal discharge, bleeding, pelvic pain.   Past Medical History:  Diagnosis Date  . Depression   . GERD (gastroesophageal reflux disease)   . Hypertension   . Hypothyroidism     Past Surgical History:  Procedure Laterality Date  . NO PAST SURGERIES      The following portions of the patient's history were reviewed and updated as appropriate: allergies, current medications, past family history, past medical history, past social history, past surgical history and problem list.   Health Maintenance:  Normal pap and negative HRHPV on 12/14/15.  Last mammogram last year.   Review of Systems:  Pertinent items noted in HPI and remainder of comprehensive ROS otherwise negative.   Objective:  Physical Exam BP 139/81   Pulse 91   Ht 5\' 2"  (1.575 m)   Wt 69.5 kg (153 lb 3.2 oz)   LMP 05/28/2017   BMI 28.02 kg/m  CONSTITUTIONAL: Well-developed, well-nourished female in no acute distress.  HENT:  Normocephalic, atraumatic. External right and left ear normal. Oropharynx is clear and moist EYES: Conjunctivae and EOM are normal. Pupils are equal, round, and reactive to light. No scleral icterus.  NECK: Normal range of motion, supple, no masses SKIN: Skin is warm and dry. No rash noted. Not diaphoretic. No erythema. No pallor. NEUROLOGIC: Alert and oriented to person, place, and time. Normal reflexes, muscle tone coordination. No cranial nerve deficit noted. PSYCHIATRIC: Normal mood and affect. Normal behavior. Normal judgment and thought content. CARDIOVASCULAR: Normal heart rate noted RESPIRATORY: Effort and breath sounds normal, no problems with respiration noted ABDOMEN: Soft, no distention noted.     PELVIC: Normal appearing external genitalia; normal appearing vaginal mucosa and cervix.  No abnormal discharge noted.  Normal uterine size, no other palpable masses, no uterine or adnexal tenderness.  Breasts: bilateral breasts normal in appearance. No erythema, deformity, or nipple discharge. No palpable abnormal masses. No axillary lymphadenopathy. MUSCULOSKELETAL: Normal range of motion. No edema noted.  Labs and Imaging Results for orders placed or performed in visit on 06/11/17  Pregnancy, urine POC  Result Value Ref Range   Preg Test, Ur NEGATIVE NEGATIVE    Assessment & Plan:  1. Essential hypertension Started in Norvasc 2.5mg  daily. Needs follow-up with PCP. Blood work collected. Encouraged lifestyle changes. - Comprehensive metabolic panel  2. Hypothyroidism, unspecified type Will recheck TSH levels today. Patient to continue current dose of medication unless otherwise notified.  - TSH - T4, free  3. Annual physical exam Mammogram scheduled. Pap smear due in October 2020. Patient requesting birth control; OCPs prescribed.   Routine preventative health maintenance measures emphasized. Please refer to After Visit Summary for other counseling recommendations.   No follow-ups on file.   Luiz Blare, DO [facility], [facility]

## 2017-06-12 LAB — COMPREHENSIVE METABOLIC PANEL
ALT: 13 IU/L (ref 0–32)
AST: 19 IU/L (ref 0–40)
Albumin/Globulin Ratio: 1.6 (ref 1.2–2.2)
Albumin: 4.7 g/dL (ref 3.5–5.5)
Alkaline Phosphatase: 42 IU/L (ref 39–117)
BUN/Creatinine Ratio: 14 (ref 9–23)
BUN: 12 mg/dL (ref 6–24)
Bilirubin Total: 0.3 mg/dL (ref 0.0–1.2)
CO2: 25 mmol/L (ref 20–29)
Calcium: 10.3 mg/dL — ABNORMAL HIGH (ref 8.7–10.2)
Chloride: 102 mmol/L (ref 96–106)
Creatinine, Ser: 0.85 mg/dL (ref 0.57–1.00)
GFR calc Af Amer: 98 mL/min/{1.73_m2} (ref >59)
GFR calc non Af Amer: 85 mL/min/{1.73_m2} (ref >59)
Globulin, Total: 2.9 g/dL (ref 1.5–4.5)
Glucose: 90 mg/dL (ref 65–99)
Potassium: 4.4 mmol/L (ref 3.5–5.2)
Sodium: 143 mmol/L (ref 134–144)
Total Protein: 7.6 g/dL (ref 6.0–8.5)

## 2017-06-12 LAB — T4, FREE: FREE T4: 1.39 ng/dL (ref 0.82–1.77)

## 2017-06-12 LAB — TSH: TSH: 1.2 u[IU]/mL (ref 0.450–4.500)

## 2017-06-13 ENCOUNTER — Ambulatory Visit
Admission: RE | Admit: 2017-06-13 | Discharge: 2017-06-13 | Disposition: A | Discharge status: Home / Self Care | Payer: BLUE CROSS/BLUE SHIELD | Source: Ambulatory Visit | Attending: Obstetrics and Gynecology | Admitting: Obstetrics and Gynecology

## 2017-06-13 ENCOUNTER — Ambulatory Visit: Payer: BLUE CROSS/BLUE SHIELD

## 2017-06-13 DIAGNOSIS — R922 Inconclusive mammogram: Secondary | ICD-10-CM | POA: Diagnosis not present

## 2017-06-13 DIAGNOSIS — N644 Mastodynia: Secondary | ICD-10-CM

## 2017-07-04 ENCOUNTER — Other Ambulatory Visit: Payer: Self-pay | Admitting: Obstetrics and Gynecology

## 2017-08-01 ENCOUNTER — Ambulatory Visit: Payer: BLUE CROSS/BLUE SHIELD | Admitting: Family Medicine

## 2017-08-01 ENCOUNTER — Encounter: Payer: Self-pay | Admitting: Family Medicine

## 2017-08-01 ENCOUNTER — Other Ambulatory Visit: Payer: Self-pay

## 2017-08-01 VITALS — BP 118/72 | HR 74 | Temp 99.2°F | Wt 154.0 lb

## 2017-08-01 DIAGNOSIS — L2084 Intrinsic (allergic) eczema: Secondary | ICD-10-CM

## 2017-08-01 DIAGNOSIS — M778 Other enthesopathies, not elsewhere classified: Secondary | ICD-10-CM

## 2017-08-01 DIAGNOSIS — I10 Essential (primary) hypertension: Secondary | ICD-10-CM

## 2017-08-01 DIAGNOSIS — E039 Hypothyroidism, unspecified: Secondary | ICD-10-CM | POA: Diagnosis not present

## 2017-08-01 MED ORDER — NORGESTIMATE-ETH ESTRADIOL 0.25-35 MG-MCG PO TABS: 1.0000 | ORAL_TABLET | Freq: Every day | ORAL | 11 refills | Status: DC

## 2017-08-01 NOTE — Patient Instructions (Addendum)
Hypertension Hypertension, commonly called high blood pressure, is when the force of blood pumping through the arteries is too strong. The arteries are the blood vessels that carry blood from the heart throughout the body. Hypertension forces the heart to work harder to pump blood and may cause arteries to become narrow or stiff. Having untreated or uncontrolled hypertension can cause heart attacks, strokes, kidney disease, and other problems. A blood pressure reading consists of a higher number over a lower number. Ideally, your blood pressure should be below 120/80. The first ("top") number is called the systolic pressure. It is a measure of the pressure in your arteries as your heart beats. The second ("bottom") number is called the diastolic pressure. It is a measure of the pressure in your arteries as the heart relaxes. What are the causes? The cause of this condition is not known. What increases the risk? Some risk factors for high blood pressure are under your control. Others are not. Factors you can change  Smoking.  Having type 2 diabetes mellitus, high cholesterol, or both.  Not getting enough exercise or physical activity.  Being overweight.  Having too much fat, sugar, calories, or salt (sodium) in your diet.  Drinking too much alcohol. Factors that are difficult or impossible to change  Having chronic kidney disease.  Having a family history of high blood pressure.  Age. Risk increases with age.  Race. You may be at higher risk if you are African-American.  Gender. Men are at higher risk than women before age 45. After age 65, women are at higher risk than men.  Having obstructive sleep apnea.  Stress. What are the signs or symptoms? Extremely high blood pressure (hypertensive crisis) may cause:  Headache.  Anxiety.  Shortness of breath.  Nosebleed.  Nausea and vomiting.  Severe chest pain.  Jerky movements you cannot control (seizures).  How is this  diagnosed? This condition is diagnosed by measuring your blood pressure while you are seated, with your arm resting on a surface. The cuff of the blood pressure monitor will be placed directly against the skin of your upper arm at the level of your heart. It should be measured at least twice using the same arm. Certain conditions can cause a difference in blood pressure between your right and left arms. Certain factors can cause blood pressure readings to be lower or higher than normal (elevated) for a short period of time:  When your blood pressure is higher when you are in a health care provider's office than when you are at home, this is called white coat hypertension. Most people with this condition do not need medicines.  When your blood pressure is higher at home than when you are in a health care provider's office, this is called masked hypertension. Most people with this condition may need medicines to control blood pressure.  If you have a high blood pressure reading during one visit or you have normal blood pressure with other risk factors:  You may be asked to return on a different day to have your blood pressure checked again.  You may be asked to monitor your blood pressure at home for 1 week or longer.  If you are diagnosed with hypertension, you may have other blood or imaging tests to help your health care provider understand your overall risk for other conditions. How is this treated? This condition is treated by making healthy lifestyle changes, such as eating healthy foods, exercising more, and reducing your alcohol intake. Your   Your health care provider may prescribe medicine if lifestyle changes are not enough to get your blood pressure under control, and if:  Your systolic blood pressure is above 130.  Your diastolic blood pressure is above 80.  Your personal target blood pressure may vary depending on your medical conditions, your age, and other factors. Follow these  instructions at home: Eating and drinking  Eat a diet that is high in fiber and potassium, and low in sodium, added sugar, and fat. An example eating plan is called the DASH (Dietary Approaches to Stop Hypertension) diet. To eat this way: ? Eat plenty of fresh fruits and vegetables. Try to fill half of your plate at each meal with fruits and vegetables. ? Eat whole grains, such as whole wheat pasta, brown rice, or whole grain bread. Fill about one quarter of your plate with whole grains. ? Eat or drink low-fat dairy products, such as skim milk or low-fat yogurt. ? Avoid fatty cuts of meat, processed or cured meats, and poultry with skin. Fill about one quarter of your plate with lean proteins, such as fish, chicken without skin, beans, eggs, and tofu. ? Avoid premade and processed foods. These tend to be higher in sodium, added sugar, and fat.  Reduce your daily sodium intake. Most people with hypertension should eat less than 1,500 mg of sodium a day.  Limit alcohol intake to no more than 1 drink a day for nonpregnant women and 2 drinks a day for men. One drink equals 12 oz of beer, 5 oz of wine, or 1 oz of hard liquor. Lifestyle  Work with your health care provider to maintain a healthy body weight or to lose weight. Ask what an ideal weight is for you.  Get at least 30 minutes of exercise that causes your heart to beat faster (aerobic exercise) most days of the week. Activities may include walking, swimming, or biking.  Include exercise to strengthen your muscles (resistance exercise), such as pilates or lifting weights, as part of your weekly exercise routine. Try to do these types of exercises for 30 minutes at least 3 days a week.  Do not use any products that contain nicotine or tobacco, such as cigarettes and e-cigarettes. If you need help quitting, ask your health care provider.  Monitor your blood pressure at home as told by your health care provider.  Keep all follow-up visits as  told by your health care provider. This is important. Medicines  Take over-the-counter and prescription medicines only as told by your health care provider. Follow directions carefully. Blood pressure medicines must be taken as prescribed.  Do not skip doses of blood pressure medicine. Doing this puts you at risk for problems and can make the medicine less effective.  Ask your health care provider about side effects or reactions to medicines that you should watch for. Contact a health care provider if:  You think you are having a reaction to a medicine you are taking.  You have headaches that keep coming back (recurring).  You feel dizzy.  You have swelling in your ankles.  You have trouble with your vision. Get help right away if:  You develop a severe headache or confusion.  You have unusual weakness or numbness.  You feel faint.  You have severe pain in your chest or abdomen.  You vomit repeatedly.  You have trouble breathing. Summary  Hypertension is when the force of blood pumping through your arteries is too strong. If this condition is  not controlled, it may put you at risk for serious complications.  Your personal target blood pressure may vary depending on your medical conditions, your age, and other factors. For most people, a normal blood pressure is less than 120/80.  Hypertension is treated with lifestyle changes, medicines, or a combination of both. Lifestyle changes include weight loss, eating a healthy, low-sodium diet, exercising more, and limiting alcohol. This information is not intended to replace advice given to you by your health care provider. Make sure you discuss any questions you have with your health care provider. Document Released: 02/26/2005 Document Revised: 01/25/2016 Document Reviewed: 01/25/2016 Elsevier Interactive Patient Education  2018 [place] Disease Alfonse Ras disease is inflammation of the tendon on the thumb side  of the wrist. Tendons are cords of tissue that connect bones to muscles. The tendons in your hand pass through a tunnel, or sheath. A slippery layer of tissue (synovium) lets the tendons move smoothly in the sheath. With de Quervain disease, the sheath swells or thickens, causing friction and pain. The condition is also called de Quervain tendinosis and de Quervain syndrome. It occurs most often in women who are 78-27 years old. What are the causes? The exact cause of de Quervain disease is not known. It may result from: Overusing your hands, especially with repetitive motions that involve twisting your hand or using a forceful grip. Pregnancy. Rheumatoid disease.  What increases the risk? You may have a greater risk for de Quervain disease if you: Are a middle-aged woman. Are pregnant. Have rheumatoid arthritis. Have diabetes. Use your hands far more than normal, especially with a tight grip or excessive twisting.  What are the signs or symptoms? Pain on the thumb side of your wrist is the main symptom of de Quervain disease. Other signs and symptoms include: Pain that gets worse when you grasp something or turn your wrist. Pain that extends up the forearm. Cysts in the area of the pain. Swelling of your wrist and hand. A sensation of snapping in the wrist. Trouble moving the thumb and wrist.  How is this diagnosed? Your health care provider may diagnose de Quervain disease based on your signs and symptoms. A physical exam will also be done. A simple test Wynn Maudlin test) that involves pulling your thumb and wrist to see if this causes pain can help determine whether you have the condition. Sometimes you may need to have an X-ray. How is this treated? Avoiding any activity that causes pain and swelling is the best treatment. Other options include: Wearing a splint. Taking medicine. Anti-inflammatory medicines and corticosteroid injections may reduce inflammation and relieve  pain. Having surgery if other treatments do not work.  Follow these instructions at home: Using ice can be helpful after doing activities that involve the sore wrist. To apply ice to the injured area: Put ice in a plastic bag. Place a towel between your skin and the bag. Leave the ice on for 20 minutes, 2-3 times a day. Take medicines only as directed by your health care provider. Wear your splint as directed. This will allow your hand to rest and heal. Contact a health care provider if: Your pain medicine does not help. Your pain gets worse. You develop new symptoms. This information is not intended to replace advice given to you by your health care provider. Make sure you discuss any questions you have with your health care provider. Document Released: 11/21/2000 Document Revised: 08/04/2015 Document Reviewed: 07/01/2013 Elsevier Interactive Patient Education  2018 [employer]vier Inc.  

## 2017-08-01 NOTE — Progress Notes (Signed)
   Subjective:    Patient ID: Karen Duncan is a 42 y.o. female presenting with Hypertension (still having headaches) and Thyroid Problem  on 08/01/2017  HPI: Started on BP meds and headaches have improved some initially, but then is on her OCP's and she is thinking this made it worse. She is also feeling tired. Also has some bumps on her back which are itchy and scarring. Notes some pain and discomfort in left wrist. Takes Aleve which helps. There for last few weeks. Reports under a lot of stress at work. BP is sometimes up when stress level increases.  Review of Systems  Constitutional: Negative for chills and fever.  Respiratory: Negative for shortness of breath.   Cardiovascular: Negative for chest pain.  Gastrointestinal: Negative for abdominal pain, nausea and vomiting.  Genitourinary: Negative for dysuria.  Skin: Negative for rash.      Objective:    BP 118/72   Pulse 74   Temp 99.2 F (37.3 C) (Oral)   Wt 154 lb (69.9 kg)   LMP 07/29/2017 (Exact Date)   SpO2 97%   BMI 28.17 kg/m  Physical Exam  Constitutional: She is oriented to person, place, and time. She appears well-developed and well-nourished. No distress.  HENT:  Head: Normocephalic and atraumatic.  Eyes: No scleral icterus.  Neck: Neck supple.  Cardiovascular: Normal rate.  Pulmonary/Chest: Effort normal.  Abdominal: Soft.  Musculoskeletal: She exhibits tenderness (left snuff box area).  Neurological: She is alert and oriented to person, place, and time.  Skin: Skin is warm and dry.  Small papules noted on upper back  Psychiatric: She has a normal mood and affect.        Assessment & Plan:   Problem List Items Addressed This Visit      High   Hypertension    BP is improved. Does not want to come off OC's--unwilling to try other contraceptive. Continue Norvasc.        Medium   Hypothyroidism    Last TSH and Free T4 WNL--continue synthroid       Other Visit Diagnoses    Tendonitis of  wrist, left    -  Primary   Aleve with food bid x 10 days   Intrinsic eczema       OTC hydrocortisone cream      Total face-to-face time with patient: 15 minutes. Over 50% of encounter was spent on counseling and coordination of care. Return in 1 year (on 08/02/2018).  Donnamae Jude 08/01/2017 4:21 PM

## 2017-08-01 NOTE — Assessment & Plan Note (Signed)
Last TSH and Free T4 WNL--continue synthroid

## 2017-08-01 NOTE — Assessment & Plan Note (Signed)
BP is improved. Does not want to come off OC's--unwilling to try other contraceptive. Continue Norvasc.

## 2017-08-08 ENCOUNTER — Other Ambulatory Visit: Payer: Self-pay | Admitting: Family Medicine

## 2017-09-06 ENCOUNTER — Other Ambulatory Visit: Payer: Self-pay | Admitting: Obstetrics and Gynecology

## 2017-09-30 ENCOUNTER — Other Ambulatory Visit: Payer: Self-pay | Admitting: Family Medicine

## 2017-10-15 ENCOUNTER — Other Ambulatory Visit: Payer: Self-pay

## 2017-10-15 MED ORDER — AMLODIPINE BESYLATE 2.5 MG PO TABS: 2.5000 mg | ORAL_TABLET | Freq: Every day | ORAL | 0 refills | Status: DC

## 2017-11-05 ENCOUNTER — Other Ambulatory Visit: Payer: Self-pay

## 2017-11-05 ENCOUNTER — Ambulatory Visit: Payer: BLUE CROSS/BLUE SHIELD | Admitting: Family Medicine

## 2017-11-05 VITALS — BP 142/84 | HR 82 | Temp 98.5°F | Wt 158.0 lb

## 2017-11-05 DIAGNOSIS — G5601 Carpal tunnel syndrome, right upper limb: Secondary | ICD-10-CM | POA: Diagnosis not present

## 2017-11-05 NOTE — Assessment & Plan Note (Signed)
Likely from overuse at work. Trial of cock up wrist splint and conservative measures.

## 2017-11-05 NOTE — Progress Notes (Signed)
    Subjective:  Karen Duncan is a 42 y.o. female who presents to the [facility] today with a chief complaint of R hand/arm pain.   HPI:  R hand and arm pain for the past 4 days. Radiates up her arm. Today is better than it has been in the last few days.  Has some numbness particularly in middle finger and below wrist. Has been trying to flick wrist a lot to restore feeling and using pressure around the wrist. 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 Has not dropped anything but feels are hand is weak and shaky.  Did notice a few bumps on her skin on R forearm but those have gone away.  No trauma. No electric sensations. No warmth or swelling or redness   ROS: Per HPI  Objective:  Physical Exam: BP (!) 142/84   Pulse 82   Temp 98.5 F (36.9 C) (Oral)   Wt 158 lb (71.7 kg)   LMP 10/26/2017 (Exact Date)   SpO2 98%   BMI 28.90 kg/m   Gen: NAD, resting comfortably MSK: R wrist with positive tinel sign as well as position phalen. 5/5 grip and UE strength bilaterally. Sensation decreased in first 3 fingers of R hand. 2+ radial artery pulse on R hand. Skin: warm, dry. No rashes Neuro: grossly normal, moves all extremities Psych: Normal affect and thought content   Assessment/Plan:  Right carpal tunnel syndrome Likely from overuse at work. Trial of cock up wrist splint and conservative measures.    Bufford Lope, DO PGY-3, [facility] 11/05/2017 11:24 AM

## 2017-11-05 NOTE — Patient Instructions (Signed)
Carpal Tunnel Syndrome Carpal tunnel syndrome is a condition that causes pain in your hand and arm. The carpal tunnel is a narrow area located on the palm side of your wrist. Repeated wrist motion or certain diseases may cause swelling within the tunnel. This swelling pinches the main nerve in the wrist (median nerve). What are the causes? This condition may be caused by:  Repeated wrist motions.  Wrist injuries.  Arthritis.  A cyst or tumor in the carpal tunnel.  Fluid buildup during pregnancy.  Sometimes the cause of this condition is not known. What increases the risk? This condition is more likely to develop in:  People who have jobs that cause them to repeatedly move their wrists in the same motion, such as butchers and cashiers.  Women.  People with certain conditions, such as: ? Diabetes. ? Obesity. ? An underactive thyroid (hypothyroidism). ? Kidney failure.  What are the signs or symptoms? Symptoms of this condition include:  A tingling feeling in your fingers, especially in your thumb, index, and middle fingers.  Tingling or numbness in your hand.  An aching feeling in your entire arm, especially when your wrist and elbow are bent for long periods of time.  Wrist pain that goes up your arm to your shoulder.  Pain that goes down into your palm or fingers.  A weak feeling in your hands. You may have trouble grabbing and holding items.  Your symptoms may feel worse during the night. How is this diagnosed? This condition is diagnosed with a medical history and physical exam. You may also have tests, including:  An electromyogram (EMG). This test measures electrical signals sent by your nerves into the muscles.  X-rays.  How is this treated? Treatment for this condition includes:  Lifestyle changes. It is important to stop doing or modify the activity that caused your condition.  Physical or occupational therapy.  Medicines for pain and inflammation.  This may include medicine that is injected into your wrist.  A wrist splint.  Surgery.  Follow these instructions at home: If you have a splint:  Wear it as told by your health care provider. Remove it only as told by your health care provider.  Loosen the splint if your fingers become numb and tingle, or if they turn cold and blue.  Keep the splint clean and dry. General instructions  Take over-the-counter and prescription medicines only as told by your health care provider.  Rest your wrist from any activity that may be causing your pain. If your condition is work related, talk to your employer about changes that can be made, such as getting a wrist pad to use while typing.  If directed, apply ice to the painful area: ? Put ice in a plastic bag. ? Place a towel between your skin and the bag. ? Leave the ice on for 20 minutes, 2-3 times per day.  Keep all follow-up visits as told by your health care provider. This is important.  Do any exercises as told by your health care provider, physical therapist, or occupational therapist. Contact a health care provider if:  You have new symptoms.  Your pain is not controlled with medicines.  Your symptoms get worse. This information is not intended to replace advice given to you by your health care provider. Make sure you discuss any questions you have with your health care provider. Document Released: 02/24/2000 Document Revised: 07/07/2015 Document Reviewed: 07/14/2014 Elsevier Interactive Patient Education  2018 [employer]vier Inc.  

## 2018-01-27 ENCOUNTER — Other Ambulatory Visit: Payer: Self-pay | Admitting: Family Medicine

## 2018-02-04 ENCOUNTER — Other Ambulatory Visit: Payer: Self-pay

## 2018-02-04 ENCOUNTER — Ambulatory Visit (INDEPENDENT_AMBULATORY_CARE_PROVIDER_SITE_OTHER): Payer: BLUE CROSS/BLUE SHIELD | Admitting: Family Medicine

## 2018-02-04 VITALS — BP 120/74 | HR 87 | Temp 98.4°F | Ht 62.0 in | Wt 162.0 lb

## 2018-02-04 DIAGNOSIS — H109 Unspecified conjunctivitis: Secondary | ICD-10-CM | POA: Diagnosis not present

## 2018-02-04 DIAGNOSIS — Z23 Encounter for immunization: Secondary | ICD-10-CM

## 2018-02-04 MED ORDER — POLYMYXIN B-TRIMETHOPRIM 10000-0.1 UNIT/ML-% OP SOLN: 1.0000 [drp] | OPHTHALMIC | 0 refills | Status: AC

## 2018-02-04 NOTE — Progress Notes (Signed)
  Subjective:    Patient ID: Karen Duncan, female    DOB: June 25, 1975, 42 y.o.   MRN: [id number]   CC: Eye pain  HPI:  Left eye pain Patient reports that for the past few days she has been experiencing eye discharge and pain.  Reports that she has had no change in vision.  Denies any blurriness.  Patient reports that she has been waking up with crusting around her eyes and occasionally having thick yellow discharge from her eye.  URI symptoms Patient reports that along with her eye pain she has also been having issues with nasal congestion and runny nose.  Patient reports that her throat has been sore for the past few days as well.  Patient reports that she has been having a cough.  Denies any fevers, chills.  Reports that she has been trying Tylenol.  Smoking status reviewed  ROS: 10 point ROS is otherwise negative, except as mentioned in HPI  Patient Active Problem List   Diagnosis Date Noted  . Bacterial conjunctivitis of left eye 02/11/2018  . Right carpal tunnel syndrome 11/05/2017  . Depression   . Hypertension   . Hypothyroidism   . GERD (gastroesophageal reflux disease)   . Leiomyoma of uterus, unspecified 10/24/2012  . PATELLO-FEMORAL SYNDROME 12/03/2008  . ANSERINE BURSITIS, LEFT 12/03/2008  . METATARSALGIA 12/03/2008     Objective:  BP 120/74   Pulse 87   Temp 98.4 F (36.9 C) (Oral)   Ht 5\' 2"  (1.575 m)   Wt 162 lb (73.5 kg)   SpO2 100%   BMI 29.63 kg/m  Vitals and nursing note reviewed  General: NAD, pleasant HEENT: Atraumatic, normocephalic, R TM wnl, L TM with some erythema however normal cone of light; oropharynx clear with no exudate, left eye with injected conjunctiva, no discharge noted, EOMI, PERRLA Cardiac: RRR, normal heart sounds, no murmurs Respiratory: CTAB, normal effort Extremities: no edema or cyanosis. WWP. Skin: warm and dry, no rashes noted Neuro: alert and oriented, no focal deficits, CN II-XII grossly intact Psych: normal  affect  Visual Acuity Right Eye Distance:   20/20 Left Eye Distance:      20/20 Bilateral Distance:     20/20  Assessment & Plan:    Bacterial conjunctivitis of left eye Patient given Polytrim eyedrops to use every 4 hours for 5 to 7 days.  Patient encouraged to continue course of antibiotics even if her eye appears to be getting better.  Patient given very strict return precautions including change in vision, worsening of discharge, pain or redness.  Visual acuity in office WNL and extraocular movements intact.  Viral URI Exam consistent with URI. No fevers, chills, rigors, sore throat concerning for influenza like illness. Overall pt is well appearing, well hydrated, without respiratory distress. Discussed symptomatic treatment - continue to monitor for fevers  - continue Tylenol/ Motrin as needed for discomfort - nasal saline to help with his nasal congestion - Use a cool mist humidifier at bedtime to help with breathing - Stressed hydration - Honey for cough - Discussed return precautions, understanding voiced   [place] Murrell Dome, DO Family Medicine Resident PGY-2

## 2018-02-04 NOTE — Patient Instructions (Addendum)
Thank you for coming to see me today. It was a pleasure! Today we talked about:   You will need to use the eye drops every 4 hours for 5-7 days, please finish the course even if your eye appears to be getting better. If you have worsening vision, please come back in.   For your cough, try honey scheduled, may use in tea.   Some other therapies you can try are: push fluids, rest, use vaporizer or mist prn and return office visit prn if symptoms persist or worsen.   Drinking warm liquids such as teas and soups can help with secretions and cough. A mist humidifier or vaporizer can work well to help with secretions and cough.  It is very important to clean the humidifier between use according to the instructions.    It was good to see you.  If you're still having trouble in the next week, come back and see [place].    Of course, if you start having trouble breathing, worsening fevers, vomiting and unable to hold down any fluids, or you have other concerns, don't hesitate to come back or go to the ED after hours.   Please follow-up as needed.  If you have any questions or concerns, please do not hesitate to call the office at [phone].  Take Care,   [place] Aarthi Uyeno, DO   Bacterial Conjunctivitis Bacterial conjunctivitis is an infection of your conjunctiva. This is the clear membrane that covers the white part of your eye and the inner surface of your eyelid. This condition can make your eye:  Red or pink.  Itchy.  This condition is caused by bacteria. This condition spreads very easily from person to person (is contagious) and from one eye to the other eye. Follow these instructions at home: Medicines  Take or apply your antibiotic medicine as told by your doctor. Do not stop taking or applying the antibiotic even if you start to feel better.  Take or apply over-the-counter and prescription medicines only as told by your doctor.  Do not touch your eyelid with the eye drop bottle or  the ointment tube. Managing discomfort  Wipe any fluid from your eye with a warm, wet washcloth or a cotton ball.  Place a cool, clean washcloth on your eye. Do this for 10-20 minutes, 3-4 times per day. General instructions  Do not wear contact lenses until the irritation is gone. Wear glasses until your doctor says it is okay to wear contacts.  Do not wear eye makeup until your symptoms are gone. Throw away any old makeup.  Change or wash your pillowcase every day.  Do not share towels or washcloths with anyone.  Wash your hands often with soap and water. Use paper towels to dry your hands.  Do not touch or rub your eyes.  Do not drive or use heavy machinery if your vision is blurry. Contact a doctor if:  You have a fever.  Your symptoms do not get better after 10 days. Get help right away if:  You have a fever and your symptoms suddenly get worse.  You have very bad pain when you move your eye.  Your face: ? Hurts. ? Is red. ? Is swollen.  You have sudden loss of vision. This information is not intended to replace advice given to you by your health care provider. Make sure you discuss any questions you have with your health care provider. Document Released: 12/06/2007 Document Revised: 08/04/2015 Document Reviewed: 12/09/2014 Elsevier  Interactive Patient Education  [employer].

## 2018-02-11 DIAGNOSIS — H109 Unspecified conjunctivitis: Secondary | ICD-10-CM | POA: Insufficient documentation

## 2018-02-11 NOTE — Assessment & Plan Note (Signed)
Patient given Polytrim eyedrops to use every 4 hours for 5 to 7 days.  Patient encouraged to continue course of antibiotics even if her eye appears to be getting better.  Patient given very strict return precautions including change in vision, worsening of discharge, pain or redness.  Visual acuity in office WNL and extraocular movements intact.

## 2018-03-22 ENCOUNTER — Other Ambulatory Visit: Payer: Self-pay | Admitting: Family Medicine

## 2018-03-22 DIAGNOSIS — E031 Congenital hypothyroidism without goiter: Secondary | ICD-10-CM

## 2018-04-29 ENCOUNTER — Other Ambulatory Visit: Payer: Self-pay | Admitting: Family Medicine

## 2018-06-19 ENCOUNTER — Other Ambulatory Visit: Payer: Self-pay | Admitting: *Deleted

## 2018-06-19 DIAGNOSIS — F32A Depression, unspecified: Secondary | ICD-10-CM

## 2018-06-19 DIAGNOSIS — F329 Major depressive disorder, single episode, unspecified: Secondary | ICD-10-CM

## 2018-06-19 MED ORDER — SERTRALINE HCL 100 MG PO TABS: 100.0000 mg | ORAL_TABLET | Freq: Every day | ORAL | 1 refills | Status: DC

## 2018-07-31 ENCOUNTER — Other Ambulatory Visit: Payer: Self-pay | Admitting: *Deleted

## 2018-07-31 DIAGNOSIS — Z308 Encounter for other contraceptive management: Secondary | ICD-10-CM

## 2018-07-31 MED ORDER — NORGESTIMATE-ETH ESTRADIOL 0.25-35 MG-MCG PO TABS: 1.0000 | ORAL_TABLET | Freq: Every day | ORAL | 2 refills | Status: DC

## 2018-08-01 ENCOUNTER — Other Ambulatory Visit: Payer: Self-pay | Admitting: Family Medicine

## 2018-08-06 ENCOUNTER — Other Ambulatory Visit: Payer: Self-pay | Admitting: General Practice

## 2018-08-06 DIAGNOSIS — Z308 Encounter for other contraceptive management: Secondary | ICD-10-CM

## 2018-08-06 MED ORDER — NORGESTIMATE-ETH ESTRADIOL 0.25-35 MG-MCG PO TABS: 1.0000 | ORAL_TABLET | Freq: Every day | ORAL | 2 refills | Status: DC

## 2018-09-17 ENCOUNTER — Ambulatory Visit: Payer: BC Managed Care – PPO | Admitting: Family Medicine

## 2018-09-17 ENCOUNTER — Encounter: Payer: BLUE CROSS/BLUE SHIELD | Admitting: Family Medicine

## 2018-09-17 ENCOUNTER — Other Ambulatory Visit: Payer: Self-pay

## 2018-09-17 ENCOUNTER — Other Ambulatory Visit (HOSPITAL_COMMUNITY)
Admission: RE | Admit: 2018-09-17 | Discharge: 2018-09-17 | Disposition: A | Discharge status: Home / Self Care | Payer: BC Managed Care – PPO | Source: Ambulatory Visit | Attending: Family Medicine | Admitting: Family Medicine

## 2018-09-17 VITALS — BP 140/62 | HR 87 | Ht 62.0 in | Wt 168.2 lb

## 2018-09-17 DIAGNOSIS — I10 Essential (primary) hypertension: Secondary | ICD-10-CM

## 2018-09-17 DIAGNOSIS — Z0000 Encounter for general adult medical examination without abnormal findings: Secondary | ICD-10-CM

## 2018-09-17 DIAGNOSIS — M542 Cervicalgia: Secondary | ICD-10-CM | POA: Diagnosis not present

## 2018-09-17 DIAGNOSIS — F329 Major depressive disorder, single episode, unspecified: Secondary | ICD-10-CM | POA: Diagnosis not present

## 2018-09-17 DIAGNOSIS — F32A Depression, unspecified: Secondary | ICD-10-CM

## 2018-09-17 DIAGNOSIS — Z01419 Encounter for gynecological examination (general) (routine) without abnormal findings: Secondary | ICD-10-CM

## 2018-09-17 MED ORDER — AMLODIPINE BESYLATE 5 MG PO TABS: 5.0000 mg | ORAL_TABLET | Freq: Every day | ORAL | 3 refills | Status: DC

## 2018-09-17 NOTE — Patient Instructions (Addendum)
It was nice to meet you today,  I have increased your Norvasc from 2.5 to 5 mg because your blood pressure was slightly elevated.  This should not cause any changes in side effects.  Continue to take it once a day.  These medicines are best taken at night before bed.  I will inform you of the results of your other lab tests.  After talking with you, you feel like you do not want to make any changes to your antidepressant medications.  I have provided below the information of family services of the [place], which offer therapy services if you want to take advantage of them.  [email or web address]:[phone]  [facility] [address] [place], [place] [address] Monday - Friday: 8:30 a.m.-12 p.m. / 1 p.m.-2:30 p.m.  [facility] [address] [place], [place] [address] Monday-Friday: 8:30 a.m.-12 p.m. / 2-3:30 p.m.  [facility] [address] [place], [place] [address] Monday-Thursday: 8 a.m.-7 p.m. Friday: 9 a.m.-5 p.m. [phone]  Have a great day,  Clemetine Marker, MD

## 2018-09-17 NOTE — Progress Notes (Signed)
[facility] Phone: [phone]   cc: Back pain  Subjective:  Back pain: Patient has left-sided pain in her cervical spine.  This pain goes to the arm, she describes it as a burning and tingling sensation.  She states she is slightly weak in the left arm, but has carpal tunnel syndrome.  This has been happening for 1 week.  She has no prior injuries, no history of trauma.  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  She states her pain lasts all day if she does not take Aleve.  She takes Aleve 3 times a day.  She also feels this burning tingling sensation go to her left leg occasionally as well  Bp: takes 2.5mg  amlodipine.  Does not miss doses.  Is not experiencing any side effects that she is aware.  Depression: Patient has a history of depression, she is currently taking Zoloft.  She believes it is helping.  She states she is getting a little "more sad" lately because of all the stress surrounding the coronavirus.  She goes to bed as soon as she gets off work she states.  She feels tired, and will often wake up at 2:58 in the morning and stay up till 6.  She also states she is eating too much, is more irritable.  She recently had plans to go visit her family in [place], but these plans got canceled due to coronavirus.  The patient lives with her husband and son.  ROS: See HPI for pertinent positives and negatives  Past Medical History  Family history reviewed for today's visit. No changes.  Social history- patient is a non-smoker  Objective: BP 140/62   Pulse 87   Ht 5\' 2"  (1.575 m)   Wt 168 lb 4 oz (76.3 kg)   LMP 09/07/2018   SpO2 98%   BMI 30.77 kg/m  Gen: NAD, alert and oriented, cooperative with exam HEENT: NCAT, EOMI, MMM Neck: FROM, supple, no masses CV: normal rate, regular rhythm. No murmurs, no rubs.  Resp: LCTAB, no wheezes, crackles.  normal work of breathing GI: nontender to palpation, BS present, no guarding or organomegaly GU: No lesions seen on vaginal exam.  No bleeding, or discharge. Msk: Strength appears equally bilaterally in the upper and lower extremities, with no decrease in strength. Neuro: CN II-XII grossly intact. no gross deficits.  Sensation intact Skin: No rashes, no lesions Psych: Patient endorsing symptoms of worsening of her depression, but is maintaining good eye contact, speech spontaneous, and affect does not appear flat or blunted.  Assessment/Plan: Neck pain on left side The patient's pain that she describes rating down to her left arm is consistent with cervical radiculopathy.  No recent traumas, it appears her symptoms are due to repetitive movements and her positioning when at work.  Patient has a sports medicine appointment with Dr. Milta Deiters the following week, which I advised her to keep and discuss this pain further with her.  Advised patient to only take Aleve as prescribed which is twice a day at most.  Encouraged her to try Tylenol before Aleve given the reduced amount of side effects.  Advised her to look out for warning signs such as loss of feeling in that arm or extensive, prolonged weakness and that she should come back to the clinic if these occur.  Depression Patient is  on Zoloft for depression.  She states she thinks this is working well.  Despite this, she is endorsing several symptoms of a worsening of depression lately including: Appetite change, decreased sleep, irritability, anhedonia.  No suicidal thoughts.  Discussed with the patient the possibility of increasing or adding a new antidepressant medication, but patient does not want to.  Gave the patient the contact information for family services of the [place], and informed her of their therapy services.  Advised patient to follow-up with PCP  Hypertension Patient is on 2.5 mg of amlodipine for hypertension.  Discussed with patient  that often we increase amlodipine dose to 5 or 10 mg.  Given that patient's blood pressure was 140/62 today, decided to increase amlodipine to 5 mg.  Healthcare maintenance Pap smear performed.  Nothing abnormal visualized on exam.  Will inform patient of results when I get them.  Lipid panel, A1c, BMP were ordered.    Clemetine Marker, MD PGY-2

## 2018-09-18 LAB — BASIC METABOLIC PANEL
BUN/Creatinine Ratio: 14 (ref 9–23)
BUN: 11 mg/dL (ref 6–24)
CO2: 25 mmol/L (ref 20–29)
Calcium: 9.7 mg/dL (ref 8.7–10.2)
Chloride: 99 mmol/L (ref 96–106)
Creatinine, Ser: 0.77 mg/dL (ref 0.57–1.00)
GFR calc Af Amer: 110 mL/min/{1.73_m2} (ref >59)
GFR calc non Af Amer: 96 mL/min/{1.73_m2} (ref >59)
Glucose: 77 mg/dL (ref 65–99)
Potassium: 4.6 mmol/L (ref 3.5–5.2)
Sodium: 142 mmol/L (ref 134–144)

## 2018-09-18 LAB — LIPID PANEL
Chol/HDL Ratio: 3.9 ratio (ref 0.0–4.4)
Cholesterol, Total: 192 mg/dL (ref 100–199)
HDL: 49 mg/dL (ref >39)
LDL Calculated: 100 mg/dL — ABNORMAL HIGH (ref 0–99)
Triglycerides: 213 mg/dL — ABNORMAL HIGH (ref 0–149)
VLDL Cholesterol Cal: 43 mg/dL — ABNORMAL HIGH (ref 5–40)

## 2018-09-18 LAB — HEMOGLOBIN A1C
Est. average glucose Bld gHb Est-mCnc: 114 mg/dL
Hgb A1c MFr Bld: 5.6 % (ref 4.8–5.6)

## 2018-09-18 LAB — TSH: TSH: 3.05 u[IU]/mL (ref 0.450–4.500)

## 2018-09-19 ENCOUNTER — Other Ambulatory Visit: Payer: Self-pay

## 2018-09-19 ENCOUNTER — Encounter: Payer: Self-pay | Admitting: Family Medicine

## 2018-09-19 ENCOUNTER — Ambulatory Visit: Payer: BC Managed Care – PPO | Admitting: Family Medicine

## 2018-09-19 DIAGNOSIS — M542 Cervicalgia: Secondary | ICD-10-CM | POA: Diagnosis not present

## 2018-09-19 DIAGNOSIS — M79605 Pain in left leg: Secondary | ICD-10-CM

## 2018-09-19 LAB — CYTOLOGY - PAP
Chlamydia: NEGATIVE
Diagnosis: NEGATIVE
HPV: NOT DETECTED
Neisseria Gonorrhea: NEGATIVE

## 2018-09-19 MED ORDER — CYCLOBENZAPRINE HCL 5 MG PO TABS: 5.0000 mg | ORAL_TABLET | Freq: Every day | ORAL | 0 refills | Status: DC

## 2018-09-19 NOTE — Patient Instructions (Addendum)
  Do this exercise and the others on your handout once or twice a day, EVERY day. Ice or use heat as well. Be conscious of your posture when working---sitting  On one side or standing on one foot.  Use the muscle relaxer at night for a few days. Continue the OTC Aleve as long as it does not upset your stomach.  I think if you will do this regularly for 3 weeks you will significantly improve. See me in 3-4 week. Great to meet you!

## 2018-09-21 ENCOUNTER — Encounter: Payer: Self-pay | Admitting: Family Medicine

## 2018-09-21 DIAGNOSIS — M542 Cervicalgia: Secondary | ICD-10-CM | POA: Insufficient documentation

## 2018-09-21 DIAGNOSIS — M79605 Pain in left leg: Secondary | ICD-10-CM | POA: Insufficient documentation

## 2018-09-21 NOTE — Progress Notes (Signed)
  Karen Duncan - 43 y.o. female MRN [id number]  Date of birth: [birth year]    SUBJECTIVE:      Chief Complaint:/ HPI:   Two plus weeks of severe posterior left sided neck and trapezius pain. Pain radiates down back and she can occasionally feel pain into her left lower extremity. No specific known injury. 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 Stand and sits more on left buttock and left lower extremity Neck/posteror shoulder (trapezo\ius) pain is the biggest issue. The lower extremity problems less intense. Neck can be 6-8 / 10 at times. Made worse as day progresses. Bothers her some at night and interferes a lot with her sleep.   ROS:     Pertinent review of systems: negative for fever or unusual weight change.No numbness, tingling or weakness in Upper extremity. No rash. No unusual fatigue  PERTINENT  PMH / PSH FH / / SH:  Past Medical, Surgical, Social, and Family History Reviewed & Updated in the EMR.  Pertinent findings include:  No personal hx DM. Does have hypothyroidism. Has had problems with right carpal tunnel syndrome in past No pertinent surgical hx Never smoker  OBJECTIVE: BP 138/78   Ht 5\' 2"  (1.575 m)   Wt 168 lb (76.2 kg)   LMP 09/07/2018   BMI 30.73 kg/m   Physical Exam:  Vital signs are reviewed. GEN WD WN NAD Neck: ttp posy\terior muscles on left side. Has FROM and negative Spurlings. No LAD. Supple SHOULDERS: From and normal strength in all planes of rotator cuff.Left trapezius and left rhomboid area TTP and this reproduces her pain to some extent. Normal scapular motion and symmetrical BACK: no vertebral ttp. Flexion at hips liited only by hamstrting tightness HIPS FROM painless Lower extremity flexion/ extension normal at hops/knees/ankles NEURO: C4-5-6 strength intact.normal DTR B elbow and forearm bilaterally. Soft touch sensation intact UE/LE GAIT normal  ASSESSMENT &  PLAN:  1. Neck/posterior left shoulder pain: muscle spasm and overuse most likely. HEP given and demnstrated, emphasis on overhead press, neck stretch, upper back, lower back hyperextension with quadraped/superman. Will try brief cycle of QHS muscle relaxers. RTC 3-4 weeks. 2. Vague left lower extremity pains involving buttock/hip: Not sure if the lower extrmity issues are result of self-mdified mechanics di]ue to upper extremity and trunk issues or if separate issue.   No sign of cervical or lumbar radiculopathy

## 2018-09-22 ENCOUNTER — Encounter: Payer: Self-pay | Admitting: Family Medicine

## 2018-09-23 DIAGNOSIS — Z0000 Encounter for general adult medical examination without abnormal findings: Secondary | ICD-10-CM | POA: Insufficient documentation

## 2018-09-23 NOTE — Assessment & Plan Note (Signed)
Patient is on Zoloft for depression.  She states she thinks this is working well.  Despite this, she is endorsing several symptoms of a worsening of depression lately including: Appetite change, decreased sleep, irritability, anhedonia.  No suicidal thoughts.  Discussed with the patient the possibility of increasing or adding a new antidepressant medication, but patient does not want to.  Gave the patient the contact information for family services of the [place], and informed her of their therapy services.  Advised patient to follow-up with PCP

## 2018-09-23 NOTE — Assessment & Plan Note (Signed)
Patient is on 2.5 mg of amlodipine for hypertension.  Discussed with patient that often we increase amlodipine dose to 5 or 10 mg.  Given that patient's blood pressure was 140/62 today, decided to increase amlodipine to 5 mg.

## 2018-09-23 NOTE — Assessment & Plan Note (Addendum)
The patient's pain that she describes rating down to her left arm is consistent with cervical radiculopathy.  No recent traumas, it appears her symptoms are due to repetitive movements and her positioning when at work.  Patient has a sports medicine appointment with Dr. Milta Deiters the following week, which I advised her to keep and discuss this pain further with her.  Advised patient to only take Aleve as prescribed which is twice a day at most.  Encouraged her to try Tylenol before Aleve given the reduced amount of side effects.  Advised her to look out for warning signs such as loss of feeling in that arm or extensive, prolonged weakness and that she should come back to the clinic if these occur.

## 2018-09-23 NOTE — Assessment & Plan Note (Addendum)
Pap smear performed.  Nothing abnormal visualized on exam.  Will inform patient of results when I get them.  Lipid panel, A1c, BMP were ordered.

## 2018-10-06 ENCOUNTER — Other Ambulatory Visit: Payer: Self-pay | Admitting: Family Medicine

## 2018-10-06 DIAGNOSIS — Z308 Encounter for other contraceptive management: Secondary | ICD-10-CM

## 2018-10-10 ENCOUNTER — Ambulatory Visit: Payer: BC Managed Care – PPO | Admitting: Family Medicine

## 2018-10-21 ENCOUNTER — Other Ambulatory Visit: Payer: Self-pay | Admitting: Family Medicine

## 2018-10-21 DIAGNOSIS — Z308 Encounter for other contraceptive management: Secondary | ICD-10-CM

## 2018-12-21 ENCOUNTER — Other Ambulatory Visit: Payer: Self-pay | Admitting: Family Medicine

## 2018-12-21 DIAGNOSIS — E031 Congenital hypothyroidism without goiter: Secondary | ICD-10-CM

## 2018-12-27 ENCOUNTER — Other Ambulatory Visit: Payer: Self-pay | Admitting: Family Medicine

## 2018-12-27 DIAGNOSIS — F329 Major depressive disorder, single episode, unspecified: Secondary | ICD-10-CM

## 2018-12-27 DIAGNOSIS — F32A Depression, unspecified: Secondary | ICD-10-CM

## 2019-04-06 ENCOUNTER — Telehealth: Payer: BC Managed Care – PPO | Admitting: Nurse Practitioner

## 2019-04-06 DIAGNOSIS — Z20822 Contact with and (suspected) exposure to covid-19: Secondary | ICD-10-CM

## 2019-04-06 DIAGNOSIS — R52 Pain, unspecified: Secondary | ICD-10-CM

## 2019-04-06 DIAGNOSIS — R519 Headache, unspecified: Secondary | ICD-10-CM

## 2019-04-06 DIAGNOSIS — R5383 Other fatigue: Secondary | ICD-10-CM

## 2019-04-06 MED ORDER — ALBUTEROL SULFATE HFA 108 (90 BASE) MCG/ACT IN AERS: 2.0000 | INHALATION_SPRAY | Freq: Four times a day (QID) | RESPIRATORY_TRACT | 0 refills | Status: DC | PRN

## 2019-04-06 NOTE — Progress Notes (Signed)
E-Visit for Corona Virus Screening  Your current symptoms could be consistent with the coronavirus.  Many health care providers can now test patients at their office but not all are.  [place] has multiple testing sites. For information on our [place] testing locations and hours go to [email or web address]  We are enrolling you in our [place] for [place] . Daily you will receive a questionnaire within the [facility] website. Our COVID 19 response team will be monitoring your responses daily.  Testing Information: The COVID-19 Community Testing sites will begin testing BY APPOINTMENT ONLY.  You can schedule online at [email or web address]  If you do not have access to a smart phone or computer you may call [phone] for an appointment.   Additional testing sites in the Community:  . For [facility] Testing sites in [facility]  FaceUpdate.[email or web address]  . For Pop-up testing sites in [place]  BowlDirectory.co.uk  . For Testing sites with regular hours [email or web address]  . For [place] [email or web address]  . For [facility] [email or web address]  . For [facility] testing in [place] and [employer] [email or web address]  . For [facility] testing in [employer]   [email or web address]  For  more information about community testing call [phone]   Please quarantine yourself while awaiting your test results. Please stay home for a minimum of 10 days from the first day of illness with improving symptoms and you have had 24 hours of no fever (without the use of Tylenol (Acetaminophen)  Motrin (Ibuprofen) or any fever reducing medication).  Also - Do not get tested prior to returning to work because once you have had a positive test the test can stay positive for more then a month in some cases.   You should wear a mask or cloth face covering over your nose and mouth if you must be around other people or animals, including pets (even at home). Try to stay at least 6 feet away from other people. This will protect the people around you.  Please continue good preventive care measures, including:  frequent hand-washing, avoid touching your face, cover coughs/sneezes, stay out of crowds and keep a 6 foot distance from others.  COVID-19 is a respiratory illness with symptoms that are similar to the flu. Symptoms are typically mild to moderate, but there have been cases of severe illness and death due to the virus.   The following symptoms may appear 2-14 days after exposure: . Fever . Cough . Shortness of breath or difficulty breathing . Chills . Repeated shaking with chills . Muscle pain . Headache . Sore throat . New loss of taste or smell . Fatigue . Congestion or runny nose . Nausea or vomiting . Diarrhea  Go to the nearest hospital ED for assessment if fever/cough/breathlessness are severe or illness seems like a threat to life.  It is vitally important that if you feel that you have an infection such as this virus or any other virus that you stay home and away from places where you may spread it to others.  You should avoid contact with people age 64 and older.   You can use medication such as A prescription inhaler called Albuterol MDI 90 mcg /actuation 2 puffs every 4 hours as needed for shortness of breath, wheezing, cough  You may also take acetaminophen (Tylenol) as needed for fever.  Reduce your risk of any infection by using the same precautions used for avoiding the common cold or flu:  Marland Kitchen Wash your  hands often with soap and warm water for at least 20 seconds.  If  soap and water are not readily available, use an alcohol-based hand sanitizer with at least 60% alcohol.  . If coughing or sneezing, cover your mouth and nose by coughing or sneezing into the elbow areas of your shirt or coat, into a tissue or into your sleeve (not your hands). . Avoid shaking hands with others and consider head nods or verbal greetings only. . Avoid touching your eyes, nose, or mouth with unwashed hands.  . Avoid close contact with people who are sick. . Avoid places or events with large numbers of people in one location, like concerts or sporting events. . Carefully consider travel plans you have or are making. . If you are planning any travel outside or inside the [place], visit the CDC's Travelers' Health webpage for the latest health notices. . If you have some symptoms but not all symptoms, continue to monitor at home and seek medical attention if your symptoms worsen. . If you are having a medical emergency, call 911.  HOME CARE . Only take medications as instructed by your medical team. . Drink plenty of fluids and get plenty of rest. . A steam or ultrasonic humidifier can help if you have congestion.   GET HELP RIGHT AWAY IF YOU HAVE EMERGENCY WARNING SIGNS** FOR COVID-19. If you or someone is showing any of these signs seek emergency medical care immediately. Call 911 or proceed to your closest emergency facility if: . You develop worsening high fever. . Trouble breathing . Bluish lips or face . Persistent pain or pressure in the chest . New confusion . Inability to wake or stay awake . You cough up blood. . Your symptoms become more severe  **This list is not all possible symptoms. Contact your medical provider for any symptoms that are sever or concerning to you.  MAKE SURE YOU   Understand these instructions.  Will watch your condition.  Will get help right away if you are not doing well or get worse.  Your e-visit answers were reviewed by a board  certified advanced clinical practitioner to complete your personal care plan.  Depending on the condition, your plan could have included both over the counter or prescription medications.  If there is a problem please reply once you have received a response from your provider.  Your safety is important to [place].  If you have drug allergies check your prescription carefully.    You can use MyChart to ask questions about today's visit, request a non-urgent call back, or ask for a work or school excuse for 24 hours related to this e-Visit. If it has been greater than 24 hours you will need to follow up with your provider, or enter a new e-Visit to address those concerns. You will get an e-mail in the next two days asking about your experience.  I hope that your e-visit has been valuable and will speed your recovery. Thank you for using e-visits.   5-10 minutes spent reviewing and documenting in chart.

## 2019-07-03 ENCOUNTER — Telehealth: Payer: BC Managed Care – PPO | Admitting: Nurse Practitioner

## 2019-07-03 DIAGNOSIS — J301 Allergic rhinitis due to pollen: Secondary | ICD-10-CM

## 2019-07-03 MED ORDER — PREDNISONE 20 MG PO TABS: ORAL_TABLET | ORAL | 0 refills | Status: DC

## 2019-07-03 MED ORDER — FLUTICASONE PROPIONATE 50 MCG/ACT NA SUSP: 2.0000 | Freq: Every day | NASAL | 6 refills | Status: AC

## 2019-07-03 NOTE — Progress Notes (Signed)
E visit for Allergic Rhinitis We are sorry that you are not feeling well.  Here is how we plan to help!  Based on what you have shared with me it looks like you have Allergic Rhinitis.  Rhinitis is when a reaction occurs that causes nasal congestion, runny nose, sneezing, and itching.  Most types of rhinitis are caused by an inflammation and are associated with symptoms in the eyes ears or throat. There are several types of rhinitis.  The most common are acute rhinitis, which is usually caused by a viral illness, allergic or seasonal rhinitis, and nonallergic or year-round rhinitis.  Nasal allergies occur certain times of the year.  Allergic rhinitis is caused when allergens in the air trigger the release of histamine in the body.  Histamine causes itching, swelling, and fluid to build up in the fragile linings of the nasal passages, sinuses and eyelids.  An itchy nose and clear discharge are common.  I recommend the following over the counter treatments: Xyzal 5 mg take 1 tablet daily  I also would recommend a nasal spray: prescription sent to pharmacy Flonase 2 sprays into each nostril once daily  You may also benefit from eye drops such as: Systane 1-2 driops each eye twice daily as needed   I have also prescribed prednisone 20mg  2 tablets at same time daily for 5 days.  HOME CARE:   You can use an over-the-counter saline nasal spray as needed  Avoid areas where there is heavy dust, mites, or molds  Stay indoors on windy days during the pollen season  Keep windows closed in home, at least in bedroom; use air conditioner.  Use high-efficiency house air filter  Keep windows closed in car, turn AC on re-circulate  Avoid playing out with dog during pollen season  GET HELP RIGHT AWAY IF:   If your symptoms do not improve within 10 days  You become short of breath  You develop yellow or green discharge from your nose for over 3 days  You have coughing fits  MAKE SURE  YOU:   Understand these instructions  Will watch your condition  Will get help right away if you are not doing well or get worse  Thank you for choosing an e-visit. Your e-visit answers were reviewed by a board certified advanced clinical practitioner to complete your personal care plan. Depending upon the condition, your plan could have included both over the counter or prescription medications. Please review your pharmacy choice. Be sure that the pharmacy you have chosen is open so that you can pick up your prescription now.  If there is a problem you may message your provider in [place] to have the prescription routed to another pharmacy. Your safety is important to [place]. If you have drug allergies check your prescription carefully.  For the next 24 hours, you can use MyChart to ask questions about today's visit, request a non-urgent call back, or ask for a work or school excuse from your e-visit provider. You will get an email in the next two days asking about your experience. I hope that your e-visit has been valuable and will speed your recovery.  5-10 minutes spent reviewing and documenting in chart.

## 2019-09-21 ENCOUNTER — Encounter: Payer: Self-pay | Admitting: Family Medicine

## 2019-09-21 DIAGNOSIS — Z308 Encounter for other contraceptive management: Secondary | ICD-10-CM

## 2019-09-21 DIAGNOSIS — F32A Depression, unspecified: Secondary | ICD-10-CM

## 2019-09-21 DIAGNOSIS — E031 Congenital hypothyroidism without goiter: Secondary | ICD-10-CM

## 2019-09-23 ENCOUNTER — Other Ambulatory Visit: Payer: Self-pay | Admitting: *Deleted

## 2019-09-23 MED ORDER — SERTRALINE HCL 100 MG PO TABS: 100.0000 mg | ORAL_TABLET | Freq: Every day | ORAL | 1 refills | Status: DC

## 2019-09-23 MED ORDER — LEVOTHYROXINE SODIUM 112 MCG PO TABS: ORAL_TABLET | ORAL | 1 refills | Status: DC

## 2019-09-23 MED ORDER — AMLODIPINE BESYLATE 5 MG PO TABS: 5.0000 mg | ORAL_TABLET | Freq: Every day | ORAL | 0 refills | Status: DC

## 2019-09-23 MED ORDER — NORGESTIMATE-ETH ESTRADIOL 0.25-35 MG-MCG PO TABS: 1.0000 | ORAL_TABLET | Freq: Every day | ORAL | 0 refills | Status: DC

## 2019-09-23 NOTE — Telephone Encounter (Signed)
Called patient, verified pharmacy and scheduled OV for 8/30 at 0830 with Dr. Andria Frames.  Talbot Grumbling, RN

## 2019-09-29 DIAGNOSIS — Z20822 Contact with and (suspected) exposure to covid-19: Secondary | ICD-10-CM | POA: Diagnosis not present

## 2019-10-23 DIAGNOSIS — Z20828 Contact with and (suspected) exposure to other viral communicable diseases: Secondary | ICD-10-CM | POA: Diagnosis not present

## 2019-11-09 ENCOUNTER — Ambulatory Visit: Payer: Self-pay | Admitting: Family Medicine

## 2019-11-26 ENCOUNTER — Encounter: Payer: Self-pay | Admitting: Family Medicine

## 2019-12-10 ENCOUNTER — Other Ambulatory Visit: Payer: Self-pay

## 2019-12-10 ENCOUNTER — Encounter: Payer: Self-pay | Admitting: Family Medicine

## 2019-12-10 ENCOUNTER — Ambulatory Visit (INDEPENDENT_AMBULATORY_CARE_PROVIDER_SITE_OTHER): Payer: BC Managed Care – PPO | Admitting: Family Medicine

## 2019-12-10 ENCOUNTER — Ambulatory Visit: Payer: BC Managed Care – PPO | Admitting: Licensed Clinical Social Worker

## 2019-12-10 VITALS — BP 148/82 | HR 68 | Wt 173.0 lb

## 2019-12-10 DIAGNOSIS — Z1159 Encounter for screening for other viral diseases: Secondary | ICD-10-CM | POA: Diagnosis not present

## 2019-12-10 DIAGNOSIS — R7303 Prediabetes: Secondary | ICD-10-CM | POA: Insufficient documentation

## 2019-12-10 DIAGNOSIS — I10 Essential (primary) hypertension: Secondary | ICD-10-CM

## 2019-12-10 DIAGNOSIS — R739 Hyperglycemia, unspecified: Secondary | ICD-10-CM

## 2019-12-10 DIAGNOSIS — F32A Depression, unspecified: Secondary | ICD-10-CM

## 2019-12-10 DIAGNOSIS — Z23 Encounter for immunization: Secondary | ICD-10-CM

## 2019-12-10 DIAGNOSIS — M545 Low back pain, unspecified: Secondary | ICD-10-CM

## 2019-12-10 DIAGNOSIS — L719 Rosacea, unspecified: Secondary | ICD-10-CM | POA: Insufficient documentation

## 2019-12-10 DIAGNOSIS — E039 Hypothyroidism, unspecified: Secondary | ICD-10-CM | POA: Diagnosis not present

## 2019-12-10 DIAGNOSIS — R300 Dysuria: Secondary | ICD-10-CM | POA: Insufficient documentation

## 2019-12-10 DIAGNOSIS — F329 Major depressive disorder, single episode, unspecified: Secondary | ICD-10-CM

## 2019-12-10 LAB — POCT URINALYSIS DIP (MANUAL ENTRY)
Bilirubin, UA: NEGATIVE
Glucose, UA: NEGATIVE mg/dL
Ketones, POC UA: NEGATIVE mg/dL
Leukocytes, UA: NEGATIVE
Nitrite, UA: NEGATIVE
Protein Ur, POC: NEGATIVE mg/dL
Spec Grav, UA: 1.02 (ref 1.010–1.025)
Urobilinogen, UA: 0.2 E.U./dL
pH, UA: 6 (ref 5.0–8.0)

## 2019-12-10 LAB — POCT UA - MICROSCOPIC ONLY

## 2019-12-10 LAB — POCT GLYCOSYLATED HEMOGLOBIN (HGB A1C): Hemoglobin A1C: 5.1 % (ref 4.0–5.6)

## 2019-12-10 MED ORDER — AMLODIPINE BESYLATE 10 MG PO TABS: 10.0000 mg | ORAL_TABLET | Freq: Every day | ORAL | 3 refills | Status: DC

## 2019-12-10 MED ORDER — SERTRALINE HCL 100 MG PO TABS: 100.0000 mg | ORAL_TABLET | Freq: Every day | ORAL | 3 refills | Status: DC

## 2019-12-10 MED ORDER — METRONIDAZOLE 1 % EX GEL: Freq: Every day | CUTANEOUS | 3 refills | Status: DC

## 2019-12-10 NOTE — Progress Notes (Signed)
Di-p

## 2019-12-10 NOTE — Chronic Care Management (AMB) (Signed)
Social Work  [place] Assessment  12/10/2019 Name: Karen Duncan MRN: [id number] DOB: [birth year] Karen Duncan is a 44 y.o. year old female who sees Hensel, Jamal Collin, MD for primary care.  LCSW was consulted to assess mental health needs due to positive PHQ-9 during office visit.  Psychiatric History - Diagnoses: depression  - Hospitalizations/ prior attempts:  no - Pharmacotherapy: zoloft; PCP will increase dose today - Outpatient therapy: none in the past open to trying  Assessment: Assessed thoughts of SI, plan and access to means. Patient employed full time at a [employer]. Does not feel like doing things she enjoys and noticed increase in thoughts of not wanting to wake up; Denies plan or acting on thoughts.   OBJECTIVE:  PHQ-9 score of 9 is an indication of mild to moderate symptoms of depression.  Symptoms impacting patient's daily function Depression screen [facility] 2/9 12/10/2019 02/04/2018 11/05/2017  Decreased Interest 1 0 0  Down, Depressed, Hopeless 1 0 0  PHQ - 2 Score 2 0 0  Altered sleeping 1 - -  Tired, decreased energy 1 - -  Change in appetite 1 - -  Feeling bad or failure about yourself  3 - -  Trouble concentrating 0 - -  Moving slowly or fidgety/restless 0 - -  Suicidal thoughts 1 - -  PHQ-9 Score 9 - -  Difficult doing work/chores Somewhat difficult - -    Plan: .LCSW will F/U with patient in one week if she is unable to connect with Dr. Hartford Poli for brief counseling  SDOH (Social Determinants of Health) assessments performed: No   Goals Addressed            This Visit's Progress   . Begin and Stick with Counseling       Follow Up Date 12/17/2019    - schedule counseling appointment ( I will connect you with Dr. Hartford Poli)   Why is this important?   Managing symptom of depression may take some time.  If you don't feel better right away, don't give up on your treatment plan.    Notes:   We discussed various therapy options. Based on  location you decided you would like to seek counseling at our office. Clinical Social Work Interventions: . Psychoeducation and/or Health Education; Referred to psychologist; Crisis Support Resource Education provided      . Track and Manage My Symptoms       Follow Up Date 12/17/2019    - ride exercise bike at least 2 to 3 times per week for 17min -review EMMI educational video on Breathing to relax and depression  Why is this important?   Keeping track of your progress will help your treatment team find the right mix of medicine and therapy for you.  Day-to-day changes in depression symptoms are normal. It may be more helpful to check your progress at the end of each week instead of every day.     Notes:   Clinical Social work interventions: PHQ 9 reviewed . Provided with information about relaxed breathing and self-care action plan . Collaborated with primary care provider  . Discussed benefits of counseling and medication  . Mindfulness or Relaxation Training; Psychoeducation and/or Health Education; Behavioral Activation       Outpatient Encounter Medications as of 12/10/2019  Medication Sig  . albuterol (VENTOLIN HFA) 108 (90 Base) MCG/ACT inhaler Inhale 2 puffs into the lungs every 6 (six) hours as needed for wheezing or shortness of breath.  Marland Kitchen  amLODipine (NORVASC) 10 MG tablet Take 1 tablet (10 mg total) by mouth at bedtime.  . fluticasone (FLONASE) 50 MCG/ACT nasal spray Place 2 sprays into both nostrils daily.  Marland Kitchen levothyroxine (SYNTHROID) 112 MCG tablet TAKE 1 TABLET BY MOUTH EVERY MORNING BEFORE BREAKFAST  . metroNIDAZOLE (METROGEL) 1 % gel Apply topically daily. To face  . norgestimate-ethinyl estradiol (ORTHO-CYCLEN) 0.25-35 MG-MCG tablet Take 1 tablet by mouth daily.  . sertraline (ZOLOFT) 100 MG tablet Take 1 tablet (100 mg total) by mouth daily.   No facility-administered encounter medications on file as of 12/10/2019.   Review of patient status, including review of  consultants reports, relevant laboratory and other test results, and collaboration with appropriate care team members and the patient's provider was performed as part of comprehensive patient evaluation and provision of care management services.   Casimer Lanius, [place] / [place]   [phone] 1:22 PM

## 2019-12-10 NOTE — Patient Instructions (Addendum)
Please check your blood pressure at home.  I sent in a higher dose of amlodipine to [facility].  You can use up your current supply by taking two pills daily.  I want the majority of readings to be less than 140 on the top number and 90 on the bottom number. I will call with the thyroid result.  Remind me to refill your thyroid medication during the call. Get back to exercising and eating less carbs.  It will help a lot of things.   I increased your sertraline.  You can take your current supply 1 & 1/2 pills daily. I sent in gel for your face to use once daily. Please reach out to Dr. Hartford Poli to get started with therapy. I have also given you a physical handout of other available therapy options, as well as the suicide prevention hotline.   Please see me in one month.    Rosacea Rosacea is a long-term (chronic) condition that affects the skin of the face, including the cheeks, nose, forehead, and chin. This condition can also affect the eyes. Rosacea causes blood vessels near the surface of the skin to enlarge, which results in redness. What are the causes? The cause of this condition is not known. Certain triggers can make rosacea worse, including:  Hot baths.  Exercise.  Sunlight.  Very hot or cold temperatures.  Hot or spicy foods and drinks.  Drinking alcohol.  Stress.  Taking blood pressure medicine.  Long-term use of topical steroids on the face. What increases the risk? You are more likely to develop this condition if you:  Are older than 43 years of age.  Are a woman.  Have light-colored skin (light complexion).  Have a family history of rosacea. What are the signs or symptoms? Symptoms of this condition include:  Redness of the face.  Red bumps or pimples on the face.  A red, enlarged nose.  Blushing easily.  Red lines on the skin.  Irritated, burning, or itchy feeling in the eyes.  Swollen eyelids.  Drainage from the eyes.  Feeling like there  is something in your eye. How is this diagnosed? This condition is diagnosed with a medical history and physical exam. How is this treated? There is no cure for this condition, but treatment can help to control your symptoms. Your health care provider may recommend that you see a skin specialist (dermatologist). Treatment may include:  Medicines that are applied to the skin or taken by mouth (orally). This can include antibiotic medicines.  Laser treatment to improve the appearance of the skin.  Surgery. This is rare. Your health care provider will also recommend the best way to take care of your skin. Even after your skin improves, you will likely need to continue treatment to prevent your rosacea from coming back. Follow these instructions at home: Skin care Take care of your skin as told by your health care provider. You may be told to do these things:  Wash your skin gently two or more times each day.  Use mild soap.  Use a sunscreen or sunblock with SPF 30 or greater.  Use gentle cosmetics that are meant for sensitive skin.  Shave with an electric shaver instead of a blade. Lifestyle  Try to keep track of what foods trigger this condition. Avoid any triggers. These may include: ? Spicy foods. ? Seafood. ? Cheese. ? Hot liquids. ? Nuts. ? Chocolate. ? Iodized salt.  Do not drink alcohol.  Avoid extremely cold  or hot temperatures.  Try to reduce your stress. If you need help, talk with your health care provider.  When you exercise, do these things to stay cool: ? Limit sun exposure to your face. ? Use a fan. ? Do shorter and more frequent intervals of exercise. General instructions  Take and apply over-the-counter and prescription medicines only as told by your health care provider.  If you were prescribed an antibiotic medicine, apply it or take it as told by your health care provider. Do not stop using the antibiotic even if your condition improves.  If your  eyelids are affected, apply warm compresses to them. Do this as told by your health care provider.  Keep all follow-up visits as told by your health care provider. This is important. Contact a health care provider if:  Your symptoms get worse.  Your symptoms do not improve after 2 months of treatment.  You have new symptoms.  You have any changes in vision or you have problems with your eyes, such as redness or itching.  You feel depressed.  You lose your appetite.  You have trouble concentrating. Summary  Rosacea is a long-term (chronic) condition that affects the skin of the face, including the cheeks, nose, forehead, and chin.  Take care of your skin as told by your health care provider.  Take and apply over-the-counter and prescription medicines only as told by your health care provider.  Contact a health care provider if your symptoms get worse or if you have any changes in vision or other problems with your eyes, such as redness or itching.  Keep all follow-up visits as told by your health care provider. This is important. This information is not intended to replace advice given to you by your health care provider. Make sure you discuss any questions you have with your health care provider. Document Revised: 07/31/2017 Document Reviewed: 07/31/2017 Elsevier Patient Education  2020 [employer].

## 2019-12-10 NOTE — Patient Instructions (Signed)
Licensed Clinical Social Worker Visit Information Ms. Knarr  it was nice speaking with you. Please call me directly if you have questions [phone] Goals we discussed today:  Goals Addressed            This Visit's Progress   . Begin and Stick with Counseling       Follow Up Date 12/17/2019    - schedule counseling appointment ( I will connect you with Dr. Hartford Poli)   Why is this important?   Managing symptom of depression may take some time.  If you don't feel better right away, don't give up on your treatment plan.    Notes:   We discussed various therapy options. Based on location you decided you would like to seek counseling at our office. Clinical Social Work Interventions: . Psychoeducation and/or Health Education; Referred to psychologist; Crisis Support Resource Education provided      . Track and Manage My Symptoms       Follow Up Date 12/17/2019    - ride exercise bike at least 2 to 3 times per week for 50min -review EMMI educational video on Breathing to relax and depression  Why is this important?   Keeping track of your progress will help your treatment team find the right mix of medicine and therapy for you.  Day-to-day changes in depression symptoms are normal. It may be more helpful to check your progress at the end of each week instead of every day.     Notes:   Clinical Social work interventions: PHQ 9 reviewed . Provided with information about relaxed breathing and self-care action plan . Collaborated with primary care provider  . Discussed benefits of counseling and medication  . Mindfulness or Psychologist, educational; Psychoeducation and/or Health Education; Print production planner provided: Yes:  Ms. Carollo received Care Management services today:  1. Care Management services include personalized support from designated clinical staff supervised by her physician, including individualized plan of care and coordination with other care  providers 2. 24/7 contact [phone] for assistance for urgent and routine care needs. 3. Care Management are voluntary services and be declined at any time by calling the office.  Patient agreed to services and verbal consent obtained.    Patient verbalizes understanding of instructions provided today.    Maurine Cane, LCSW

## 2019-12-11 ENCOUNTER — Encounter: Payer: Self-pay | Admitting: Family Medicine

## 2019-12-11 LAB — CMP14+EGFR
ALT: 12 IU/L (ref 0–32)
AST: 16 IU/L (ref 0–40)
Albumin/Globulin Ratio: 1.6 (ref 1.2–2.2)
Albumin: 4.5 g/dL (ref 3.8–4.8)
Alkaline Phosphatase: 53 IU/L (ref 44–121)
BUN/Creatinine Ratio: 13 (ref 9–23)
BUN: 10 mg/dL (ref 6–24)
Bilirubin Total: 0.3 mg/dL (ref 0.0–1.2)
CO2: 22 mmol/L (ref 20–29)
Calcium: 10.2 mg/dL (ref 8.7–10.2)
Chloride: 101 mmol/L (ref 96–106)
Creatinine, Ser: 0.77 mg/dL (ref 0.57–1.00)
GFR calc Af Amer: 109 mL/min/{1.73_m2} (ref >59)
GFR calc non Af Amer: 95 mL/min/{1.73_m2} (ref >59)
Globulin, Total: 2.8 g/dL (ref 1.5–4.5)
Glucose: 76 mg/dL (ref 65–99)
Potassium: 4.3 mmol/L (ref 3.5–5.2)
Sodium: 142 mmol/L (ref 134–144)
Total Protein: 7.3 g/dL (ref 6.0–8.5)

## 2019-12-11 LAB — LIPID PANEL
Chol/HDL Ratio: 3.8 ratio (ref 0.0–4.4)
Cholesterol, Total: 203 mg/dL — ABNORMAL HIGH (ref 100–199)
HDL: 54 mg/dL (ref >39)
LDL Chol Calc (NIH): 123 mg/dL — ABNORMAL HIGH (ref 0–99)
Triglycerides: 150 mg/dL — ABNORMAL HIGH (ref 0–149)
VLDL Cholesterol Cal: 26 mg/dL (ref 5–40)

## 2019-12-11 LAB — TSH: TSH: 3.08 u[IU]/mL (ref 0.450–4.500)

## 2019-12-11 LAB — HEPATITIS C ANTIBODY: Hep C Virus Ab: <0.1 s/co ratio (ref 0.0–0.9)

## 2019-12-11 NOTE — Assessment & Plan Note (Signed)
TSH today. Refill Synthroid if appropriate.

## 2019-12-11 NOTE — Assessment & Plan Note (Addendum)
Patient with erythematous malar rash sparing the nasolabial folds of few weeks duration. Reports irregular sunscreen use, though is working on increasing adherence.  -Prescribed Metronidazole 1% gel for daily application to region -Encouraged regular sunscreen use -Given Rosacea handout -Consider workup for SLE if no improvement on this regimen, or significant associated symptoms.

## 2019-12-11 NOTE — Progress Notes (Signed)
    SUBJECTIVE:   CHIEF COMPLAINT / HPI:    Cloudy urine Patient reports noted cloudiness to urine a few months ago that shortly resolved, but returned three weeks ago. Additionally reports soreness in right low back. She has chronic low back pain, but conveys current soreness feels different from this. Denies urinary frequency, hematuria, fevers or chills. Maintains proper hydration.     HTN Patient reports home blood pressure readings typically in the 140s / 80s. Takes Amlodipine 5 mg daily at night.  Skin rash Patient reports red rash on both cheeks in the past few weeks. She does not appreciate any difference with sun exposure. She wears sunscreen sporadically, but is currently working on improving adherence. She conveys has long-standing joint pain (several years).   Depression Patient reports increased feelings of loneliness in the past few weeks. Husband works a lot, and recently conveyed to patient that he does not know how to help. Patient maintains employment at bank. She conveys crying frequently, and finding it difficult to get out of bed. She has not been exercising due to this, and expresses concern about this and  her recent weight gain. She expresses guilt regarding her mood, and impact on her relationship with her family. Intermittently thinks of sleeping and not waking back up. A key protective factor is wanting to be alive for her son.     Office Visit from 12/10/2019 in [place] Office Visit from 08/01/2017 in [place] Office Visit from 03/26/2016 in [place]  Thoughts that you would be better off dead, or of hurting yourself in some way Several days Not at all Not at all  PHQ-9 Total Score 9 9 10      PERTINENT  PMH / PSH: HTN, Hypothyoidism, Depression, left sided neck and left lower extremity pain  OBJECTIVE:   BP (!) 148/82   Pulse 68   Wt 173 lb (78.5 kg)   LMP 11/21/2019   SpO2 98%   BMI  31.64 kg/m   General: No acute distress. Pleasant and engaged. Intermittently tearful. Pulmonary: Normal work of breathing Psych: Affect congruent with mood, with normal range. Normal speech.  Skin: Confluent erythematous malar rash sparing the nasolabial folds  ASSESSMENT/PLAN:  HM -Flu vaccine today -A1C today -CMP -Hep C Antibody -Lipid panel  Cloudy Urine urinalysis and urine culture today  Rosacea Patient with erythematous malar rash sparing the nasolabial folds of few weeks duration. Reports irregular sunscreen use, though is working on increasing adherence.  -Prescribed Metronidazole 1% gel for daily application to region -Encouraged regular sunscreen use -Given Rosacea handout -Consider workup for SLE if no improvement on this regimen, or significant associated symptoms.   Depression Patient reporting increased feelings of isolation, and worsening depression in the past few weeks, with passive suicidal ideation. PHQ-9 of 9 today. Seen by our stellar clinical social worker Casimer Lanius who coordinated care -Patient agreeable to starting therapy. Given Dr. Hartford Poli contact. Patient to call to initiate therapy -Increased sertraline dose -Given suicide prevention hotline   Hypothyroidism TSH today. Refill Synthroid if appropriate.  Hypertension Inadequately controlled. BP of 148/82 today, congruent with patient reported home recordings. Increase Amlodipine to 10 mg daily     [place]

## 2019-12-11 NOTE — Assessment & Plan Note (Signed)
Inadequately controlled. BP of 148/82 today, congruent with patient reported home recordings. Increase Amlodipine to 10 mg daily

## 2019-12-11 NOTE — Progress Notes (Signed)
My, what a busy visit.  Several acute complaints.  The most important issue is the depression.  I am very pleased that we have her started on counseling.  I will see her in one month.  We will revisit the need for antidepressants at that time.  For now, counseling.

## 2019-12-11 NOTE — Assessment & Plan Note (Signed)
Patient reporting increased feelings of isolation, and worsening depression in the past few weeks, with passive suicidal ideation. PHQ-9 of 9 today. Seen by our stellar clinical social worker Casimer Lanius who coordinated care -Patient agreeable to starting therapy. Given Dr. Hartford Poli contact. Patient to call to initiate therapy -Increased sertraline dose -Given suicide prevention hotline

## 2019-12-12 ENCOUNTER — Other Ambulatory Visit: Payer: Self-pay | Admitting: Family Medicine

## 2019-12-12 DIAGNOSIS — Z308 Encounter for other contraceptive management: Secondary | ICD-10-CM

## 2019-12-12 LAB — URINE CULTURE: Organism ID, Bacteria: NO GROWTH

## 2019-12-14 ENCOUNTER — Encounter: Payer: Self-pay | Admitting: Family Medicine

## 2019-12-14 ENCOUNTER — Telehealth: Payer: Self-pay | Admitting: Family Medicine

## 2019-12-14 NOTE — Telephone Encounter (Signed)
Called and LM that urine culture showed no growth.

## 2019-12-15 ENCOUNTER — Telehealth: Payer: Self-pay | Admitting: Licensed Clinical Social Worker

## 2019-12-15 NOTE — Chronic Care Management (AMB) (Signed)
    Clinical Social Work  Care Management Outreach   12/15/2019 Name: JADELIN ENG MRN: [id number] DOB: Mar 17, 1975  CARELI LUZADER is a 44 y.o. year old female who is a primary care patient of Hensel, Jamal Collin, MD .   F/U phone call to Mare Ferrari today to assess needs, and progress with care plan goals.  The outreach was unsuccessful. A HIPPA compliant phone message was left for the patient providing contact information and requesting a return call.   Intervention: LCSW sent therapy resources to patient via Mychart based on patient's need, and insurance. Plan: If no return call is received LCSW will F/U in 5 to 7 days  Review of patient status, including review of consultants reports, relevant laboratory and other test results, and collaboration with appropriate care team members and the patient's provider was performed as part of comprehensive patient evaluation and provision of care management services.    Casimer Lanius, [place] / [place]   [phone] 3:36 PM

## 2019-12-21 ENCOUNTER — Other Ambulatory Visit: Payer: Self-pay | Admitting: *Deleted

## 2019-12-21 DIAGNOSIS — Z308 Encounter for other contraceptive management: Secondary | ICD-10-CM

## 2019-12-21 DIAGNOSIS — E031 Congenital hypothyroidism without goiter: Secondary | ICD-10-CM

## 2019-12-21 MED ORDER — NORGESTIMATE-ETH ESTRADIOL 0.25-35 MG-MCG PO TABS: 1.0000 | ORAL_TABLET | Freq: Every day | ORAL | 3 refills | Status: DC

## 2019-12-21 MED ORDER — LEVOTHYROXINE SODIUM 112 MCG PO TABS: ORAL_TABLET | ORAL | 1 refills | Status: DC

## 2019-12-30 ENCOUNTER — Telehealth: Payer: Self-pay | Admitting: Licensed Clinical Social Worker

## 2019-12-30 NOTE — Chronic Care Management (AMB) (Signed)
   Social Work Care Management  2nd Unsuccessful  Phone Outreach   12/30/2019 Name: Karen Duncan MRN: [id number] DOB: 1975-04-10  Referred by: Zenia Resides, MD , Reason for referral : Karen Duncan is a 44 y.o. year old female who sees Hensel, Jamal Collin, MD for primary care. 2nd unsuccessful telephone outreach attempt to Karen Duncan today.  Sent patient a message via mychart as well as a HIPPA compliant phone message providing contact information and requesting a return call.  Plan: LCSW will wait for return call, if no return call is received, will reach out to Karen Duncan again over the next 7 days. If unable to reach Karen Duncan by phone on the 3rd attempt, will discontinue outreach calls but will be available at any time to provide services to Karen Duncan.   Review of patient status, including review of consultants reports, relevant laboratory and other test results, and collaboration with appropriate care team members and the patient's provider was performed as part of comprehensive patient evaluation and provision of care management services.   Casimer Lanius, [place] / [place]   [phone] 9:02 AM

## 2020-01-15 ENCOUNTER — Telehealth: Payer: Self-pay | Admitting: Licensed Clinical Social Worker

## 2020-01-15 NOTE — Chronic Care Management (AMB) (Signed)
   Social Work Care Management  3rd Unsuccessful  Phone Outreach  01/15/2020 Name: Karen Duncan MRN: [id number] DOB: 01-19-1976  Referred by: Zenia Resides, MD Reason for referral : Care Coordination (3rd outreach) ;.  Karen Duncan is a 44 y.o. year old female who sees Hensel, Jamal Collin, MD for primary care. To day was the 3rd unsuccessful telephone outreach attempt to Karen Duncan today. A HIPPA compliant phone message was left for the patient providing contact information and requesting a return call.  Review of patient status, including review of consultants reports, relevant laboratory and other test results, and collaboration with appropriate care team members and the patient's provider was performed as part of comprehensive patient evaluation and provision of care management services.   Plan: LCSW will discontinue outreach calls but will gladly be available at any time to provide services to Karen Duncan if she calls.  Casimer Lanius, [place] / [place]   [phone] 8:38 AM

## 2020-08-13 ENCOUNTER — Other Ambulatory Visit: Payer: Self-pay | Admitting: Family Medicine

## 2020-08-13 DIAGNOSIS — E031 Congenital hypothyroidism without goiter: Secondary | ICD-10-CM

## 2020-11-21 ENCOUNTER — Other Ambulatory Visit: Payer: Self-pay | Admitting: Family Medicine

## 2020-11-21 DIAGNOSIS — F32A Depression, unspecified: Secondary | ICD-10-CM

## 2020-11-24 ENCOUNTER — Other Ambulatory Visit: Payer: Self-pay | Admitting: Family Medicine

## 2020-11-24 DIAGNOSIS — Z308 Encounter for other contraceptive management: Secondary | ICD-10-CM

## 2020-11-24 MED ORDER — NORGESTIMATE-ETH ESTRADIOL 0.25-35 MG-MCG PO TABS: 1.0000 | ORAL_TABLET | Freq: Every day | ORAL | 0 refills | Status: DC

## 2020-11-28 ENCOUNTER — Encounter: Payer: Self-pay | Admitting: Family Medicine

## 2020-11-28 ENCOUNTER — Other Ambulatory Visit: Payer: Self-pay | Admitting: Family Medicine

## 2020-11-28 DIAGNOSIS — Z308 Encounter for other contraceptive management: Secondary | ICD-10-CM

## 2021-01-11 ENCOUNTER — Encounter: Payer: Self-pay | Admitting: Family Medicine

## 2021-01-13 ENCOUNTER — Other Ambulatory Visit: Payer: Self-pay | Admitting: *Deleted

## 2021-01-13 DIAGNOSIS — Z308 Encounter for other contraceptive management: Secondary | ICD-10-CM

## 2021-01-13 MED ORDER — NORGESTIMATE-ETH ESTRADIOL 0.25-35 MG-MCG PO TABS: 1.0000 | ORAL_TABLET | Freq: Every day | ORAL | 0 refills | Status: DC

## 2021-02-20 ENCOUNTER — Other Ambulatory Visit: Payer: Self-pay | Admitting: Family Medicine

## 2021-02-20 DIAGNOSIS — F32A Depression, unspecified: Secondary | ICD-10-CM

## 2021-03-20 ENCOUNTER — Other Ambulatory Visit: Payer: Self-pay | Admitting: Family Medicine

## 2021-03-20 DIAGNOSIS — Z308 Encounter for other contraceptive management: Secondary | ICD-10-CM

## 2021-04-03 ENCOUNTER — Encounter: Payer: Self-pay | Admitting: Family Medicine

## 2021-04-03 ENCOUNTER — Other Ambulatory Visit: Payer: Self-pay

## 2021-04-03 ENCOUNTER — Ambulatory Visit: Payer: BC Managed Care – PPO | Admitting: Family Medicine

## 2021-04-03 ENCOUNTER — Ambulatory Visit (INDEPENDENT_AMBULATORY_CARE_PROVIDER_SITE_OTHER): Payer: BC Managed Care – PPO

## 2021-04-03 VITALS — BP 132/68 | HR 83 | Ht 62.0 in | Wt 185.6 lb

## 2021-04-03 DIAGNOSIS — R739 Hyperglycemia, unspecified: Secondary | ICD-10-CM | POA: Diagnosis not present

## 2021-04-03 DIAGNOSIS — Z0000 Encounter for general adult medical examination without abnormal findings: Secondary | ICD-10-CM | POA: Diagnosis not present

## 2021-04-03 DIAGNOSIS — I10 Essential (primary) hypertension: Secondary | ICD-10-CM | POA: Diagnosis not present

## 2021-04-03 DIAGNOSIS — Z800 Family history of malignant neoplasm of digestive organs: Secondary | ICD-10-CM | POA: Diagnosis not present

## 2021-04-03 DIAGNOSIS — R0683 Snoring: Secondary | ICD-10-CM | POA: Insufficient documentation

## 2021-04-03 DIAGNOSIS — Z23 Encounter for immunization: Secondary | ICD-10-CM

## 2021-04-03 DIAGNOSIS — E039 Hypothyroidism, unspecified: Secondary | ICD-10-CM

## 2021-04-03 DIAGNOSIS — R0609 Other forms of dyspnea: Secondary | ICD-10-CM

## 2021-04-03 LAB — POCT GLYCOSYLATED HEMOGLOBIN (HGB A1C): Hemoglobin A1C: 5.5 % (ref 4.0–5.6)

## 2021-04-03 NOTE — Assessment & Plan Note (Signed)
Good control but amlodipine may be contributing to her leg swelling.  Await labs.  FU 1-2 weeks.  Likely switch off amlopipine then.

## 2021-04-03 NOTE — Assessment & Plan Note (Signed)
Long diff diagnosis.  Deconditioning, anemia, CHF.  With normal pulse ox and no dyspnea today, I will not pursue PE.  At her age and hypertension, likely need to switch off estrogen containing contraceptives.

## 2021-04-03 NOTE — Assessment & Plan Note (Signed)
Largely up to date and no obvious at risk behaviors.  She has multiple issues today which are not preventative and will need FU.

## 2021-04-03 NOTE — Assessment & Plan Note (Signed)
Sister died at age 45 of what sounds like colon cancer

## 2021-04-03 NOTE — Assessment & Plan Note (Signed)
Recheck TSH 

## 2021-04-03 NOTE — Assessment & Plan Note (Signed)
Likely OSA by hx.  Will need sleep study.

## 2021-04-03 NOTE — Progress Notes (Signed)
° ° °  SUBJECTIVE:   CHIEF COMPLAINT / HPI:   Annual exam and several problems 45 Problems. Snores loudly.  Husband says she stops breathing at times.  Never had a sleep study.  Because of daytime sleepiness, drinks lots of coffee and Red Bull. Bilateral leg swelling when travels.  Goes down when not traveling.  Has some shortness of breath (see DOE) which does not correlate with leg swelling.  Today, no swelling or dyspnea.  Of note, she is on combined oral contraceptive. Hot flashes.  Still having regular menses.   DOE.  Has noticed slow worsening over last 6 months.  Now has one flight DOE.  No ortnopnea or PND.  No cardiac or pulm hx .  Non smoker. Hypertension.  BP readings OK.  On amlodipine which can cause leg swelling.  When she checks her BP at home, the machine will occasionally read irregular heart beat.  She does not have palpitations or syncope. Family HX positive for colon cancer (we think)  Translation from Portugese is difficult.  She originally said "stomach cancer" and was very clear that all family members had colonoscopies because of the sister's cancer.   Hypothyroid: Due for a recheck.  She is tired frequently. FHx pos for diabetes.  Wants screened HPDP needs flu and covid booster.  OK to PAP q5y since had cotesting last pap.    OBJECTIVE:   BP 132/68    Pulse 83    Ht 5\' 2"  (1.575 m)    Wt 185 lb 9.6 oz (84.2 kg)    SpO2 98%    BMI 33.95 kg/m   HEENT WNL Neck no thyromegally Lungs clear Cardiac RRR without m or g Abd benign Ext no edema.  ASSESSMENT/PLAN:   Family history of colon cancer Sister died at age 45 of what sounds like colon cancer  DOE (dyspnea on exertion) Long diff diagnosis.  Deconditioning, anemia, CHF.  With normal pulse ox and no dyspnea today, I will not pursue PE.  At her age and hypertension, likely need to switch off estrogen containing contraceptives.  Healthcare maintenance Largely up to date and no obvious at risk behaviors.  She  has multiple issues today which are not preventative and will need FU.  Hypertension Good control but amlodipine may be contributing to her leg swelling.  Await labs.  FU 1-2 weeks.  Likely switch off amlopipine then.    Hypothyroidism Recheck TSH.    Snoring Likely OSA by hx.  Will need sleep study.      Zenia Resides, MD [place]

## 2021-04-03 NOTE — Patient Instructions (Addendum)
Breast cancer screening before age 45 (9-49) is optional.  Let me know if you want it You will be due for a pap in July 2025. We now recommend colon cancer screening to begin at age 51.  It sounds like your sister had colon cancer.  I put in a referral, someone should call. I want to see you in a week or two to go over the blood test results:  Things we will cover in that visit Refer you for a sleep study for possible sleep apnea With the blood work, talk more about the irregular heart beat seen when you take your blood pressure. Should we get you off the birth control pills and what would you do to not get pregnant.  I am worried about you taking estrogen at your age.   We may want to switch your BP medicine because a side effect of amlodipine is leg swelling.

## 2021-04-04 ENCOUNTER — Encounter: Payer: Self-pay | Admitting: Internal Medicine

## 2021-04-04 LAB — CMP14+EGFR
ALT: 15 IU/L (ref 0–32)
AST: 17 IU/L (ref 0–40)
Albumin/Globulin Ratio: 1.5 (ref 1.2–2.2)
Albumin: 4.4 g/dL (ref 3.8–4.8)
Alkaline Phosphatase: 57 IU/L (ref 44–121)
BUN/Creatinine Ratio: 16 (ref 9–23)
BUN: 12 mg/dL (ref 6–24)
Bilirubin Total: 0.2 mg/dL (ref 0.0–1.2)
CO2: 22 mmol/L (ref 20–29)
Calcium: 9.7 mg/dL (ref 8.7–10.2)
Chloride: 101 mmol/L (ref 96–106)
Creatinine, Ser: 0.74 mg/dL (ref 0.57–1.00)
Globulin, Total: 2.9 g/dL (ref 1.5–4.5)
Glucose: 97 mg/dL (ref 70–99)
Potassium: 4.3 mmol/L (ref 3.5–5.2)
Sodium: 138 mmol/L (ref 134–144)
Total Protein: 7.3 g/dL (ref 6.0–8.5)
eGFR: 102 mL/min/{1.73_m2} (ref >59)

## 2021-04-04 LAB — LIPID PANEL
Chol/HDL Ratio: 3 ratio (ref 0.0–4.4)
Cholesterol, Total: 183 mg/dL (ref 100–199)
HDL: 62 mg/dL (ref >39)
LDL Chol Calc (NIH): 103 mg/dL — ABNORMAL HIGH (ref 0–99)
Triglycerides: 102 mg/dL (ref 0–149)
VLDL Cholesterol Cal: 18 mg/dL (ref 5–40)

## 2021-04-04 LAB — CBC
Hematocrit: 37.3 % (ref 34.0–46.6)
Hemoglobin: 12.9 g/dL (ref 11.1–15.9)
MCH: 30.1 pg (ref 26.6–33.0)
MCHC: 34.6 g/dL (ref 31.5–35.7)
MCV: 87 fL (ref 79–97)
Platelets: 314 10*3/uL (ref 150–450)
RBC: 4.28 x10E6/uL (ref 3.77–5.28)
RDW: 12 % (ref 11.7–15.4)
WBC: 6.8 10*3/uL (ref 3.4–10.8)

## 2021-04-04 LAB — BRAIN NATRIURETIC PEPTIDE: BNP: 7.4 pg/mL (ref 0.0–100.0)

## 2021-04-04 LAB — TSH: TSH: 5.01 u[IU]/mL — ABNORMAL HIGH (ref 0.450–4.500)

## 2021-04-05 MED ORDER — LEVOTHYROXINE SODIUM 125 MCG PO TABS: 125.0000 ug | ORAL_TABLET | ORAL | 3 refills | Status: DC

## 2021-04-05 NOTE — Addendum Note (Signed)
Addended by: Zenia Resides on: 04/05/2021 10:55 AM   Modules accepted: Orders

## 2021-04-17 ENCOUNTER — Other Ambulatory Visit: Payer: Self-pay

## 2021-04-17 ENCOUNTER — Ambulatory Visit: Payer: BC Managed Care – PPO | Admitting: Family Medicine

## 2021-04-17 ENCOUNTER — Encounter: Payer: Self-pay | Admitting: Family Medicine

## 2021-04-17 DIAGNOSIS — E039 Hypothyroidism, unspecified: Secondary | ICD-10-CM

## 2021-04-17 DIAGNOSIS — I10 Essential (primary) hypertension: Secondary | ICD-10-CM | POA: Diagnosis not present

## 2021-04-17 DIAGNOSIS — R0683 Snoring: Secondary | ICD-10-CM | POA: Diagnosis not present

## 2021-04-17 DIAGNOSIS — Z3009 Encounter for other general counseling and advice on contraception: Secondary | ICD-10-CM | POA: Diagnosis not present

## 2021-04-17 MED ORDER — LOSARTAN POTASSIUM 50 MG PO TABS: 50.0000 mg | ORAL_TABLET | Freq: Every day | ORAL | 3 refills | Status: DC

## 2021-04-17 NOTE — Patient Instructions (Addendum)
I suggest that you should be off the estrogen containing pills sometime in the next year.  The estrogen plus age plus high blood pressure puts you at increased risk for heart attack or stroke. You do not have any diabetes. Your cholesterol was OK. We should remeasure your thyroid after you have been on the higher dose for three months.   Stop the amlodipine - it may be causing leg swelling.   I sent in a new blood pressure prescription.  Please get blood tests done one week after you start the new medicine.  Please check your blood pressure multiple times at home.  We are aiming for 140/90 or less.   Someone should call to set up the sleep study.

## 2021-04-18 ENCOUNTER — Encounter: Payer: Self-pay | Admitting: Family Medicine

## 2021-04-18 DIAGNOSIS — Z3009 Encounter for other general counseling and advice on contraception: Secondary | ICD-10-CM | POA: Insufficient documentation

## 2021-04-18 NOTE — Assessment & Plan Note (Signed)
Assume amlodipine contributing to leg swelling.  Decrease amlodipine and start arb.  Recheck BMP in one week.  Follow home BPs to make sure she is controled with ARB.

## 2021-04-18 NOTE — Progress Notes (Signed)
° ° °  SUBJECTIVE:   CHIEF COMPLAINT / HPI:   FU office visit of 04/03/21.   Fhx of colon cancer.  Has GI appointment scheduled. Snoring and daytime drowsiness.  Seems like highly likely that she has sleep apnea based on sx. Hypertension.  Sub optimal control.  Also see leg swelling. Leg swelling.  No renal or liver disease.  BNP reassuring no CHF.  Assume either venous insufficiency or amlodipine related. FHx of DM.  A1C in non diabetic range. OCPs.  Patient is 46.  Non smoker.  Only ASCVD risk factor is hypertension.  Discussed risk of estrogen. Hypothyroid.  Levothyroxine dose increase per phone based on mild elevation of TSH.  Will need repeat in 3 months.    OBJECTIVE:   BP (!) 152/89    Pulse 87    Ht 5\' 2"  (1.575 m)    Wt 186 lb 12.8 oz (84.7 kg)    LMP 04/14/2021    SpO2 98%    BMI 34.17 kg/m   Lungs clear Cardiac RRR without m or g  ASSESSMENT/PLAN:   Hypertension Assume amlodipine contributing to leg swelling.  Decrease amlodipine and start arb.  Recheck BMP in one week.  Follow home BPs to make sure she is controled with ARB.  Snoring Sleep study.  Hypothyroidism Dose just increased.  Recheck TSH in 2 months.  General counseling and advice on contraceptive management Concerned about ASCVD risk in 46 yo hypertensive patient.  Discussed other forms of contraception.  My goal would be to get her off estrogens in the next 6-12 months.     Karen Resides, MD [facility]

## 2021-04-18 NOTE — Assessment & Plan Note (Signed)
Concerned about ASCVD risk in 46 yo hypertensive patient.  Discussed other forms of contraception.  My goal would be to get her off estrogens in the next 6-12 months.

## 2021-04-18 NOTE — Assessment & Plan Note (Signed)
Dose just increased.  Recheck TSH in 2 months.

## 2021-04-18 NOTE — Assessment & Plan Note (Signed)
Sleep study

## 2021-04-19 ENCOUNTER — Other Ambulatory Visit: Payer: Self-pay | Admitting: Family Medicine

## 2021-04-19 DIAGNOSIS — Z308 Encounter for other contraceptive management: Secondary | ICD-10-CM

## 2021-04-20 MED ORDER — NORGESTIMATE-ETH ESTRADIOL 0.25-35 MG-MCG PO TABS: 1.0000 | ORAL_TABLET | Freq: Every day | ORAL | 0 refills | Status: DC

## 2021-05-17 ENCOUNTER — Ambulatory Visit (AMBULATORY_SURGERY_CENTER): Payer: BC Managed Care – PPO

## 2021-05-17 ENCOUNTER — Other Ambulatory Visit: Payer: Self-pay

## 2021-05-17 VITALS — Ht 62.0 in | Wt 186.0 lb

## 2021-05-17 DIAGNOSIS — Z1211 Encounter for screening for malignant neoplasm of colon: Secondary | ICD-10-CM

## 2021-05-17 DIAGNOSIS — Z800 Family history of malignant neoplasm of digestive organs: Secondary | ICD-10-CM

## 2021-05-17 MED ORDER — NA SULFATE-K SULFATE-MG SULF 17.5-3.13-1.6 GM/177ML PO SOLN: 1.0000 | Freq: Once | ORAL | 0 refills | Status: AC

## 2021-05-17 NOTE — Progress Notes (Signed)
No egg or soy allergy known to patient  ?No issues known to pt with past sedation with any surgeries or procedures ?Patient denies ever being told they had issues or difficulty with intubation  ?No FH of Malignant Hyperthermia ?Pt is not on diet pills ?Pt is not on home 02  ?Pt is not on blood thinners  ?Pt denies issues with constipation;  ?No A fib or A flutter ?Pt is fully vaccinated for Covid x 2 + booster; ?NO PA's for preps discussed with pt in PV today  ?Discussed with pt there will be an out-of-pocket cost for prep and that varies from $0 to 70 + dollars - pt verbalized understanding  ?Due to the COVID-19 pandemic we are asking patients to follow certain guidelines in PV and the [place]   ?Pt aware of COVID protocols and [facility] guidelines  ?PV completed over the phone. Pt verified name, DOB, address and insurance during PV today.  ?Pt mailed instruction packet with copy of consent form to read and not return, and instructions.  ?Pt encouraged to call with questions or issues.  ?If pt has My chart, procedure instructions sent via My Chart  ? ?

## 2021-05-18 ENCOUNTER — Encounter: Payer: Self-pay | Admitting: Internal Medicine

## 2021-05-22 ENCOUNTER — Other Ambulatory Visit: Payer: Self-pay | Admitting: Family Medicine

## 2021-05-22 DIAGNOSIS — F32A Depression, unspecified: Secondary | ICD-10-CM

## 2021-05-31 ENCOUNTER — Encounter: Payer: Self-pay | Admitting: Internal Medicine

## 2021-05-31 ENCOUNTER — Ambulatory Visit (AMBULATORY_SURGERY_CENTER): Payer: BC Managed Care – PPO | Admitting: Internal Medicine

## 2021-05-31 ENCOUNTER — Other Ambulatory Visit: Payer: Self-pay

## 2021-05-31 VITALS — BP 133/78 | HR 77 | Temp 98.4°F | Resp 13 | Ht 62.0 in | Wt 186.0 lb

## 2021-05-31 DIAGNOSIS — Z1211 Encounter for screening for malignant neoplasm of colon: Secondary | ICD-10-CM

## 2021-05-31 DIAGNOSIS — D123 Benign neoplasm of transverse colon: Secondary | ICD-10-CM | POA: Diagnosis not present

## 2021-05-31 DIAGNOSIS — K621 Rectal polyp: Secondary | ICD-10-CM | POA: Diagnosis not present

## 2021-05-31 DIAGNOSIS — D128 Benign neoplasm of rectum: Secondary | ICD-10-CM

## 2021-05-31 DIAGNOSIS — D120 Benign neoplasm of cecum: Secondary | ICD-10-CM | POA: Diagnosis not present

## 2021-05-31 DIAGNOSIS — Z800 Family history of malignant neoplasm of digestive organs: Secondary | ICD-10-CM | POA: Diagnosis not present

## 2021-05-31 MED ORDER — SODIUM CHLORIDE 0.9 % IV SOLN: 500.0000 mL | Freq: Once | INTRAVENOUS | Status: DC

## 2021-05-31 NOTE — Progress Notes (Signed)
Pt awake, report to RN, [facility]  °

## 2021-05-31 NOTE — Progress Notes (Signed)
Called to room to assist during endoscopic procedure.  Patient ID and intended procedure confirmed with present staff. Received instructions for my participation in the procedure from the performing physician.  

## 2021-05-31 NOTE — Op Note (Signed)
[facility] ?Patient Name: Karen Duncan ?Procedure Date: 05/31/2021 9:59 AM ?MRN: [id number] ?Endoscopist: Jerene Bears , MD ?Age: 46 ?Referring MD:  ?Date of Birth: 03/31/1975 ?Gender: Female ?Account #: [id number] ?Procedure:                Colonoscopy ?Indications:              Screening in patient at increased risk: Family  ?                          history of 1st-degree relative (sister) with  ?                          colorectal cancer before age 80 years, This is the  ?                          patient's first colonoscopy ?Medicines:                Monitored Anesthesia Care ?Procedure:                Pre-Anesthesia Assessment: ?                          - Prior to the procedure, a History and Physical  ?                          was performed, and patient medications and  ?                          allergies were reviewed. The patient's tolerance of  ?                          previous anesthesia was also reviewed. The risks  ?                          and benefits of the procedure and the sedation  ?                          options and risks were discussed with the patient.  ?                          All questions were answered, and informed consent  ?                          was obtained. Prior Anticoagulants: The patient has  ?                          taken no previous anticoagulant or antiplatelet  ?                          agents. ASA Grade Assessment: II - A patient with  ?                          mild systemic disease. After reviewing the risks  ?  and benefits, the patient was deemed in  ?                          satisfactory condition to undergo the procedure. ?                          After obtaining informed consent, the colonoscope  ?                          was passed under direct vision. Throughout the  ?                          procedure, the patient's blood pressure, pulse, and  ?                          oxygen saturations were monitored  continuously. The  ?                          Olympus #[id number] Colonoscope was introduced through  ?                          the anus and advanced to the cecum, identified by  ?                          appendiceal orifice and ileocecal valve. The  ?                          colonoscopy was performed without difficulty. The  ?                          patient tolerated the procedure well. The quality  ?                          of the bowel preparation was good with exception of  ?                          cecum/ascending colon which required copious  ?                          irrigation and lavage. The ileocecal valve,  ?                          appendiceal orifice, and rectum were photographed. ?Scope In: 10:12:52 AM ?Scope Out: 10:33:09 AM ?Scope Withdrawal Time: 0 hours 18 minutes 3 seconds  ?Total Procedure Duration: 0 hours 20 minutes 17 seconds  ?Findings:                 The digital rectal exam was normal. ?                          Two sessile polyps were found in the cecum. The  ?                          polyps were 6 to 9 mm in size. These polyps  were  ?                          removed with a cold snare. Resection and retrieval  ?                          were complete. ?                          Two sessile polyps were found in the transverse  ?                          colon. The polyps were 2 to 7 mm in size. These  ?                          polyps were removed with a cold snare. Resection  ?                          and retrieval were complete. ?                          A 3 mm polyp was found in the rectum. The polyp was  ?                          sessile. The polyp was removed with a cold snare.  ?                          Resection and retrieval were complete. ?                          External and internal hemorrhoids were found during  ?                          retroflexion and during digital exam. The  ?                          hemorrhoids were medium-sized. ?Complications:             No immediate complications. ?Estimated Blood Loss:     Estimated blood loss was minimal. ?Impression:               - Two 6 to 9 mm polyps in the cecum, removed with a  ?                          cold snare. Resected and retrieved. ?                          - Two 2 to 7 mm polyps in the transverse colon,  ?                          removed with a cold snare. Resected and retrieved. ?                          - One 3 mm polyp in the rectum, removed  with a cold  ?                          snare. Resected and retrieved. ?                          - External and internal hemorrhoids. ?Recommendation:           - Patient has a contact number available for  ?                          emergencies. The signs and symptoms of potential  ?                          delayed complications were discussed with the  ?                          patient. Return to normal activities tomorrow.  ?                          Written discharge instructions were provided to the  ?                          patient. ?                          - Resume previous diet. ?                          - Continue present medications. ?                          - Await pathology results. ?                          - Repeat colonoscopy is recommended for  ?                          surveillance (2 day prep). The colonoscopy date  ?                          will be determined after pathology results from  ?                          today's exam become available for review. ?Jerene Bears, MD ?05/31/2021 10:37:22 AM ?This report has been signed electronically. ?

## 2021-05-31 NOTE — Progress Notes (Signed)
? ?GASTROENTEROLOGY PROCEDURE H&P NOTE  ? ?Primary Care Physician: ?Zenia Resides, MD ? ? ? ?Reason for Procedure:   Colon cancer screening; family history of colon cancer in patient's sister at age 45 ? ?Plan:    colonoscopy ? ?Patient is appropriate for endoscopic procedure(s) in the ambulatory ([facility]) setting. ? ?The nature of the procedure, as well as the risks, benefits, and alternatives were carefully and thoroughly reviewed with the patient. Ample time for discussion and questions allowed. The patient understood, was satisfied, and agreed to proceed.  ? ? ? ?HPI: ?Karen Duncan is a 46 y.o. female who presents for colonoscopy.  Medical history as below.  Tolerated the prep.  No recent chest pain or shortness of breath.  No abdominal pain today. ? ?Past Medical History:  ?Diagnosis Date  ? Anxiety   ? on meds  ? Depression   ? on meds  ? GERD (gastroesophageal reflux disease)   ? Hypertension   ? on meds  ? Hypothyroidism   ? on meds  ? Seasonal allergies   ? ? ?Past Surgical History:  ?Procedure Laterality Date  ? COLONOSCOPY    ? NO PAST SURGERIES    ? ? ?Prior to Admission medications   ?Medication Sig Start Date End Date Taking? Authorizing Provider  ?levothyroxine (SYNTHROID) 125 MCG tablet Take 1 tablet (125 mcg total) by mouth every morning. 30 minutes before food 04/05/21  Yes Hensel, Jamal Collin, MD  ?losartan (COZAAR) 50 MG tablet Take 1 tablet (50 mg total) by mouth at bedtime. 04/17/21  Yes Hensel, Jamal Collin, MD  ?norgestimate-ethinyl estradiol (ESTARYLLA) 0.25-35 MG-MCG tablet Take 1 tablet by mouth daily. 04/20/21  Yes Hensel, Jamal Collin, MD  ?sertraline (ZOLOFT) 100 MG tablet TAKE 1 TABLET DAILY (NEED APPOINTMENT BEFORE DUE FOR ADDITIONAL REFILLS) 05/22/21  Yes Hensel, Jamal Collin, MD  ?albuterol (VENTOLIN HFA) 108 (90 Base) MCG/ACT inhaler Inhale 2 puffs into the lungs every 6 (six) hours as needed for wheezing or shortness of breath. 04/06/19   Chevis Pretty, FNP  ?fluticasone (FLONASE) 50  MCG/ACT nasal spray Place 2 sprays into both nostrils daily. ?Patient taking differently: Place 2 sprays into both nostrils daily as needed. 07/03/19   Chevis Pretty, FNP  ? ? ?Current Outpatient Medications  ?Medication Sig Dispense Refill  ? levothyroxine (SYNTHROID) 125 MCG tablet Take 1 tablet (125 mcg total) by mouth every morning. 30 minutes before food 90 tablet 3  ? losartan (COZAAR) 50 MG tablet Take 1 tablet (50 mg total) by mouth at bedtime. 90 tablet 3  ? norgestimate-ethinyl estradiol (ESTARYLLA) 0.25-35 MG-MCG tablet Take 1 tablet by mouth daily. 84 tablet 0  ? sertraline (ZOLOFT) 100 MG tablet TAKE 1 TABLET DAILY (NEED APPOINTMENT BEFORE DUE FOR ADDITIONAL REFILLS) 90 tablet 3  ? albuterol (VENTOLIN HFA) 108 (90 Base) MCG/ACT inhaler Inhale 2 puffs into the lungs every 6 (six) hours as needed for wheezing or shortness of breath. 18 g 0  ? fluticasone (FLONASE) 50 MCG/ACT nasal spray Place 2 sprays into both nostrils daily. (Patient taking differently: Place 2 sprays into both nostrils daily as needed.) 16 g 6  ? ?Current Facility-Administered Medications  ?Medication Dose Route Frequency Provider Last Rate Last Admin  ? 0.9 %  sodium chloride infusion  500 mL Intravenous Once Jeydan Barner, Lajuan Lines, MD      ? ? ?Allergies as of 05/31/2021  ? (No Known Allergies)  ? ? ?Family History  ?Problem Relation Age of Onset  ? Hypertension Mother   ?  Hypothyroidism Mother   ? Arthritis Mother   ? COPD Father   ? Colon cancer Sister 45  ? Colon polyps Sister 2  ? Cervical cancer Sister   ? Stomach cancer Paternal Aunt   ? Stomach cancer Paternal Aunt   ? Esophageal cancer Neg Hx   ? Rectal cancer Neg Hx   ? ? ?Social History  ? ?Socioeconomic History  ? Marital status: Married  ?  Spouse name: Not on file  ? Number of children: Not on file  ? Years of education: Not on file  ? Highest education level: Not on file  ?Occupational History  ? Not on file  ?Tobacco Use  ? Smoking status: Never  ? Smokeless tobacco:  Never  ?Vaping Use  ? Vaping Use: Never used  ?Substance and Sexual Activity  ? Alcohol use: Yes  ?  Alcohol/week: 1.0 standard drink  ?  Types: 1 Standard drinks or equivalent per week  ? Drug use: No  ? Sexual activity: Yes  ?  Birth control/protection: None  ?Other Topics Concern  ? Not on file  ?Social History Narrative  ? Not on file  ? ?Social Determinants of Health  ? ?Financial Resource Strain: Not on file  ?Food Insecurity: Not on file  ?Transportation Needs: Not on file  ?Physical Activity: Not on file  ?Stress: Not on file  ?Social Connections: Not on file  ?Intimate Partner Violence: Not on file  ? ? ?Physical Exam: ?Vital signs in last 24 hours: ?'@BP'$  131/63   Pulse 83   Temp 98.4 ?F (36.9 ?C) (Temporal)   Ht '5\' 2"'$  (1.575 m)   Wt 186 lb (84.4 kg)   LMP 05/14/2021 (Exact Date)   SpO2 100%   BMI 34.02 kg/m?  ?GEN: NAD ?EYE: Sclerae anicteric ?ENT: MMM ?CV: Non-tachycardic ?Pulm: CTA b/l ?GI: Soft, NT/ND ?NEURO:  Alert & Oriented x 3 ? ? ?Zenovia Jarred, MD ?[facility] ? ?05/31/2021 10:04 AM ? ?

## 2021-05-31 NOTE — Progress Notes (Signed)
VS-AS ? ?Pt's states no medical or surgical changes since previsit or office visit. ? ?

## 2021-05-31 NOTE — Patient Instructions (Signed)
YOU HAD AN ENDOSCOPIC PROCEDURE TODAY AT [facility]:   Refer to the procedure report that was given to you for any specific questions about what was found during the examination.  If the procedure report does not answer your questions, please call your gastroenterologist to clarify.  If you requested that your care partner not be given the details of your procedure findings, then the procedure report has been included in a sealed envelope for you to review at your convenience later. ? ?**handout given on polyps and hemorrhoids** ? ?YOU SHOULD EXPECT: Some feelings of bloating in the abdomen. Passage of more gas than usual.  Walking can help get rid of the air that was put into your GI tract during the procedure and reduce the bloating. If you had a lower endoscopy (such as a colonoscopy or flexible sigmoidoscopy) you may notice spotting of blood in your stool or on the toilet paper. If you underwent a bowel prep for your procedure, you may not have a normal bowel movement for a few days. ? ?Please Note:  You might notice some irritation and congestion in your nose or some drainage.  This is from the oxygen used during your procedure.  There is no need for concern and it should clear up in a day or so. ? ?SYMPTOMS TO REPORT IMMEDIATELY: ? ?Following lower endoscopy (colonoscopy or flexible sigmoidoscopy): ? Excessive amounts of blood in the stool ? Significant tenderness or worsening of abdominal pains ? Swelling of the abdomen that is new, acute ? Fever of 100?F or higher ? ? ?For urgent or emergent issues, a gastroenterologist can be reached at any hour by calling [phone]. ?Do not use MyChart messaging for urgent concerns.  ? ? ?DIET:  We do recommend a small meal at first, but then you may proceed to your regular diet.  Drink plenty of fluids but you should avoid alcoholic beverages for 24 hours. ? ?ACTIVITY:  You should plan to take it easy for the rest of today and you should NOT DRIVE  or use heavy machinery until tomorrow (because of the sedation medicines used during the test).   ? ?FOLLOW UP: ?Our staff will call the number listed on your records 48-72 hours following your procedure to check on you and address any questions or concerns that you may have regarding the information given to you following your procedure. If we do not reach you, we will leave a message.  We will attempt to reach you two times.  During this call, we will ask if you have developed any symptoms of COVID 19. If you develop any symptoms (ie: fever, flu-like symptoms, shortness of breath, cough etc.) before then, please call [phone].  If you test positive for Covid 19 in the 2 weeks post procedure, please call and report this information to [place].   ? ?If any biopsies were taken you will be contacted by phone or by letter within the next 1-3 weeks.  Please call us at [phone] if you have not heard about the biopsies in 3 weeks.  ? ? ?SIGNATURES/CONFIDENTIALITY: ?You and/or your care partner have signed paperwork which will be entered into your electronic medical record.  These signatures attest to the fact that that the information above on your After Visit Summary has been reviewed and is understood.  Full responsibility of the confidentiality of this discharge information lies with you and/or your care-partner.  ?

## 2021-06-02 ENCOUNTER — Telehealth: Payer: Self-pay

## 2021-06-02 ENCOUNTER — Telehealth: Payer: Self-pay | Admitting: *Deleted

## 2021-06-02 NOTE — Telephone Encounter (Signed)
?  Follow up Call- ? ? ?  05/31/2021  ?  9:10 AM  ?Call back number  ?Post procedure Call Back phone  # [phone]  ?Permission to leave phone message Yes  ?  ? ?Patient questions: ? ?Do you have a fever, pain , or abdominal swelling? No. ?Pain Score  0 * ? ?Have you tolerated food without any problems? Yes.   ? ?Have you been able to return to your normal activities? Yes.   ? ?Do you have any questions about your discharge instructions: ?Diet   No. ?Medications  No. ?Follow up visit  No. ? ?Do you have questions or concerns about your Care? No. ? ?Actions: ?* If pain score is 4 or above: ?No action needed, pain <4. ? ? ?

## 2021-06-02 NOTE — Telephone Encounter (Signed)
Attempted f/u call. No answer, left VM. 

## 2021-06-05 ENCOUNTER — Encounter: Payer: Self-pay | Admitting: Internal Medicine

## 2021-06-30 ENCOUNTER — Other Ambulatory Visit: Payer: Self-pay | Admitting: Family Medicine

## 2021-06-30 DIAGNOSIS — Z308 Encounter for other contraceptive management: Secondary | ICD-10-CM

## 2021-07-11 DIAGNOSIS — L989 Disorder of the skin and subcutaneous tissue, unspecified: Secondary | ICD-10-CM | POA: Diagnosis not present

## 2021-07-11 DIAGNOSIS — L820 Inflamed seborrheic keratosis: Secondary | ICD-10-CM | POA: Diagnosis not present

## 2021-07-11 DIAGNOSIS — L608 Other nail disorders: Secondary | ICD-10-CM | POA: Diagnosis not present

## 2021-07-11 DIAGNOSIS — L819 Disorder of pigmentation, unspecified: Secondary | ICD-10-CM | POA: Diagnosis not present

## 2021-07-11 DIAGNOSIS — L298 Other pruritus: Secondary | ICD-10-CM | POA: Diagnosis not present

## 2021-07-24 ENCOUNTER — Encounter: Payer: Self-pay | Admitting: Family Medicine

## 2021-07-24 ENCOUNTER — Other Ambulatory Visit: Payer: Self-pay | Admitting: Family Medicine

## 2021-07-24 DIAGNOSIS — F32A Depression, unspecified: Secondary | ICD-10-CM

## 2021-07-24 MED ORDER — SERTRALINE HCL 100 MG PO TABS: ORAL_TABLET | ORAL | 3 refills | Status: DC

## 2021-08-15 ENCOUNTER — Encounter: Payer: Self-pay | Admitting: *Deleted

## 2021-08-24 DIAGNOSIS — L03011 Cellulitis of right finger: Secondary | ICD-10-CM | POA: Diagnosis not present

## 2021-08-24 DIAGNOSIS — L608 Other nail disorders: Secondary | ICD-10-CM | POA: Diagnosis not present

## 2021-08-24 DIAGNOSIS — L280 Lichen simplex chronicus: Secondary | ICD-10-CM | POA: Diagnosis not present

## 2021-09-01 ENCOUNTER — Other Ambulatory Visit: Payer: Self-pay | Admitting: Family Medicine

## 2021-09-01 DIAGNOSIS — Z308 Encounter for other contraceptive management: Secondary | ICD-10-CM

## 2021-12-07 DIAGNOSIS — M25512 Pain in left shoulder: Secondary | ICD-10-CM | POA: Diagnosis not present

## 2021-12-07 DIAGNOSIS — M542 Cervicalgia: Secondary | ICD-10-CM | POA: Diagnosis not present

## 2021-12-18 ENCOUNTER — Other Ambulatory Visit: Payer: Self-pay

## 2021-12-18 ENCOUNTER — Telehealth: Payer: Self-pay

## 2021-12-18 DIAGNOSIS — Z308 Encounter for other contraceptive management: Secondary | ICD-10-CM

## 2021-12-18 DIAGNOSIS — M7542 Impingement syndrome of left shoulder: Secondary | ICD-10-CM | POA: Diagnosis not present

## 2021-12-18 DIAGNOSIS — M6281 Muscle weakness (generalized): Secondary | ICD-10-CM | POA: Diagnosis not present

## 2021-12-18 NOTE — Telephone Encounter (Signed)
error 

## 2021-12-21 ENCOUNTER — Telehealth: Payer: Self-pay

## 2021-12-21 ENCOUNTER — Other Ambulatory Visit: Payer: Self-pay | Admitting: Family Medicine

## 2021-12-21 DIAGNOSIS — Z308 Encounter for other contraceptive management: Secondary | ICD-10-CM

## 2021-12-21 NOTE — Telephone Encounter (Signed)
Please call patient and remind her of the following, which was copied directly from the 2/23 note: General counseling and advice on contraceptive management Concerned about ASCVD risk in 46 yo hypertensive patient.  Discussed other forms of contraception.  My goal would be to get her off estrogens in the next 6-12 months.

## 2021-12-21 NOTE — Telephone Encounter (Signed)
Patient calls nurse line regarding denial for norgestimate-ethinyl estradiol. I advised that it appears she needs an appointment prior to additional refills.   Patient states that she had an appointment in February and is unsure why she needs another appointment.   Will forward to PCP for further advisement.   Talbot Grumbling, RN

## 2021-12-22 NOTE — Telephone Encounter (Signed)
Returned call to patient. She did not answer, LVM asking patient to return call to office.   Talbot Grumbling, RN

## 2022-01-14 ENCOUNTER — Other Ambulatory Visit: Payer: Self-pay | Admitting: Family Medicine

## 2022-01-14 DIAGNOSIS — Z308 Encounter for other contraceptive management: Secondary | ICD-10-CM

## 2022-01-15 MED ORDER — NORGESTIMATE-ETH ESTRADIOL 0.25-35 MG-MCG PO TABS: 1.0000 | ORAL_TABLET | Freq: Every day | ORAL | 0 refills | Status: DC

## 2022-01-29 ENCOUNTER — Ambulatory Visit: Payer: BC Managed Care – PPO | Admitting: Family Medicine

## 2022-02-02 DIAGNOSIS — M25512 Pain in left shoulder: Secondary | ICD-10-CM | POA: Diagnosis not present

## 2022-02-06 ENCOUNTER — Other Ambulatory Visit: Payer: Self-pay | Admitting: Family Medicine

## 2022-02-06 DIAGNOSIS — Z308 Encounter for other contraceptive management: Secondary | ICD-10-CM

## 2022-02-06 DIAGNOSIS — M25512 Pain in left shoulder: Secondary | ICD-10-CM | POA: Diagnosis not present

## 2022-02-07 NOTE — Telephone Encounter (Signed)
Called, no answer and left message.  This is the absolute last time I will refill this medication without an appointment.  Because of the MI and CVA risk of estrogens, I intend to switch her off this medication.

## 2022-02-15 ENCOUNTER — Ambulatory Visit: Payer: BC Managed Care – PPO | Admitting: Family Medicine

## 2022-02-15 ENCOUNTER — Encounter: Payer: Self-pay | Admitting: Family Medicine

## 2022-02-15 VITALS — BP 118/76 | HR 92 | Ht 62.0 in | Wt 189.6 lb

## 2022-02-15 DIAGNOSIS — N926 Irregular menstruation, unspecified: Secondary | ICD-10-CM

## 2022-02-15 DIAGNOSIS — M25512 Pain in left shoulder: Secondary | ICD-10-CM | POA: Diagnosis not present

## 2022-02-15 DIAGNOSIS — I10 Essential (primary) hypertension: Secondary | ICD-10-CM | POA: Diagnosis not present

## 2022-02-15 DIAGNOSIS — Z3009 Encounter for other general counseling and advice on contraception: Secondary | ICD-10-CM

## 2022-02-15 DIAGNOSIS — R739 Hyperglycemia, unspecified: Secondary | ICD-10-CM

## 2022-02-15 DIAGNOSIS — E039 Hypothyroidism, unspecified: Secondary | ICD-10-CM | POA: Diagnosis not present

## 2022-02-15 MED ORDER — NORETHINDRONE 0.35 MG PO TABS: 1.0000 | ORAL_TABLET | Freq: Every day | ORAL | 3 refills | Status: DC

## 2022-02-15 NOTE — Assessment & Plan Note (Signed)
Pt denies any concerns over thyroid level. - Will monitor with TSH

## 2022-02-15 NOTE — Assessment & Plan Note (Signed)
Pt's BP is very well controlled today at 118/76.  - Continue current medications - Will monitor blood work: CMP, Ha1c, lipid panel

## 2022-02-15 NOTE — Progress Notes (Signed)
Ms Mangal came in because I insisted that it is time to get off her combined OCPs.  She is willing and we decided on progestin only.  She knows she must take at the same time every day. Also discussed. Obesity.  BMI<35 so does not qualify for GLP1 Hx of subclinical hypothyroid.  Check TSH FHx + DM  Check A1c HBP, good control.  Needs creat and K checked. Hot flashes.  Perimenopausal.  Difficult to say since she has been on combined OCPs.  Had irregular menses prior to OCPs.  Will check [place] after she has been off OCPs x 3 months.

## 2022-02-15 NOTE — Progress Notes (Signed)
    SUBJECTIVE:   CHIEF COMPLAINT / HPI:   Karen Duncan is a 46 y.o. female who presents today for med change and lab work. Current concerns include weight. She is accompanied today by her husband.  She is currently on a combined OCP, but is aware of the increased risk of adverse effects with continued estrogen at her age. She is ready to stop Combined OCPs. She would prefer to start a minipill versus tubal ligation. She is unsure whether she is starting menopause; she does report increased hot flashes. She reports a history of irregular periods.  Reports inability to lose weight with exercise. Only thing that worked was cutting out carbs, but hard to maintain.  PERTINENT  PMH / PSH: HTN, hypothyroidism, irregular menses. Sister died in 32s of colon cancer; also had cervical cancer (?).   OBJECTIVE:   BP 118/76   Pulse 92   Ht '5\' 2"'$  (1.575 m)   Wt 189 lb 9.6 oz (86 kg)   LMP 01/25/2022   SpO2 97%   BMI 34.68 kg/m   General: Pt is well appearing and seated comfortably in chair. NAD. Cardiovascular: RRR, no murmurs, rubs, gallops. Pulmonary: Normal work of breathing. Lungs clear to auscultation bilaterally. Neuro/Psych: A&Ox3. Normal affect.  ASSESSMENT/PLAN:   Contraceptive Medication Management Pt has remained on combined OCP into her 53s. Discussed dangers of breast cancer, heart disease, and clot in someone her age on estrogen. She is willing to stop combined OCP; she would prefer to start minipill versus tubal ligation. - Will start pt on norethindrone; discussed need for patient to take this pill every day at the same time - Will arrange for future FSH once pt has been on estrogen for at least 3 months to evaluate entry into menopause  Hypertension Pt's BP is very well controlled today at 118/76.  - Continue current medications - Will monitor blood work: CMP, Ha1c, lipid panel  Hypothyroidism Pt denies any concerns over thyroid level. - Will monitor with TSH    Weight Management  Pt reports significant concerns about weight and is wondering about Ozempic. - Informed pt she unfortunately does not qualify for Ozempic coverage at this time - Encouraged continued healthy diet and introduction of regular exercise   Carmelina Dane, [place]

## 2022-02-15 NOTE — Patient Instructions (Addendum)
I sent in a prescription for the progesterone only birth control  I will call with blood test results Come back for a lab only appointment in April for the menopause blood test. Your new doctor will be Dr. Ky Barban

## 2022-02-16 LAB — CMP14+EGFR
ALT: 19 IU/L (ref 0–32)
AST: 18 IU/L (ref 0–40)
Albumin/Globulin Ratio: 1.5 (ref 1.2–2.2)
Albumin: 4.3 g/dL (ref 3.9–4.9)
Alkaline Phosphatase: 51 IU/L (ref 44–121)
BUN/Creatinine Ratio: 15 (ref 9–23)
BUN: 10 mg/dL (ref 6–24)
Bilirubin Total: 0.5 mg/dL (ref 0.0–1.2)
CO2: 21 mmol/L (ref 20–29)
Calcium: 9.5 mg/dL (ref 8.7–10.2)
Chloride: 98 mmol/L (ref 96–106)
Creatinine, Ser: 0.66 mg/dL (ref 0.57–1.00)
Globulin, Total: 2.8 g/dL (ref 1.5–4.5)
Glucose: 77 mg/dL (ref 70–99)
Potassium: 4.1 mmol/L (ref 3.5–5.2)
Sodium: 134 mmol/L (ref 134–144)
Total Protein: 7.1 g/dL (ref 6.0–8.5)
eGFR: 110 mL/min/{1.73_m2} (ref >59)

## 2022-02-16 LAB — HEMOGLOBIN A1C
Est. average glucose Bld gHb Est-mCnc: 120 mg/dL
Hgb A1c MFr Bld: 5.8 % — ABNORMAL HIGH (ref 4.8–5.6)

## 2022-02-16 LAB — LIPID PANEL
Chol/HDL Ratio: 3.3 ratio (ref 0.0–4.4)
Cholesterol, Total: 177 mg/dL (ref 100–199)
HDL: 54 mg/dL (ref >39)
LDL Chol Calc (NIH): 102 mg/dL — ABNORMAL HIGH (ref 0–99)
Triglycerides: 120 mg/dL (ref 0–149)
VLDL Cholesterol Cal: 21 mg/dL (ref 5–40)

## 2022-02-16 LAB — TSH: TSH: 1.97 u[IU]/mL (ref 0.450–4.500)

## 2022-02-20 DIAGNOSIS — M25512 Pain in left shoulder: Secondary | ICD-10-CM | POA: Diagnosis not present

## 2022-03-02 ENCOUNTER — Other Ambulatory Visit: Payer: Self-pay | Admitting: Family Medicine

## 2022-03-02 DIAGNOSIS — Z308 Encounter for other contraceptive management: Secondary | ICD-10-CM

## 2022-03-15 ENCOUNTER — Other Ambulatory Visit: Payer: Self-pay | Admitting: Family Medicine

## 2022-03-15 DIAGNOSIS — E039 Hypothyroidism, unspecified: Secondary | ICD-10-CM

## 2022-03-27 NOTE — H&P (Signed)
PREOPERATIVE H&P  Chief Complaint: LEFT SHOULDER CARTILAGE DISORDER, OA, IMPINGEMENT SYNDROME, BICEPS TENDINITIS  HPI: Karen Duncan is a 47 y.o. female who is scheduled for, Procedure(s): SHOULDER ARTHROSCOPY WITH SUBACROMIAL DECOMPRESSION, ROTATOR CUFF REPAIR AND BICEP TENDON REPAIR SHOULDER ARTHROSCOPY WITH DISTAL CLAVICLE EXCISION.   Patient has a past medical history significant for GERD, HTN, hypothyroidism.   Patient is a 47 year-old who has been struggling with shoulder pain since August.  She has tried and failed injections aspirations.  She has continued pain with sleep, as well as overhead activities.  She has trouble with activities of daily living.  She had an attempt at an aspiration of her paralabral cyst at the spinal glenoid notch.  She continues to have pain primarily.  Weakness is a secondary issue.    Symptoms are rated as moderate to severe, and have been worsening.  This is significantly impairing activities of daily living.    Please see clinic note for further details on this patient's care.    She has elected for surgical management.   Past Medical History:  Diagnosis Date   Anxiety    on meds   Depression    on meds   GERD (gastroesophageal reflux disease)    Hypertension    on meds   Hypothyroidism    on meds   Seasonal allergies    Past Surgical History:  Procedure Laterality Date   COLONOSCOPY     NO PAST SURGERIES     Social History   Socioeconomic History   Marital status: Married    Spouse name: Not on file   Number of children: Not on file   Years of education: Not on file   Highest education level: Not on file  Occupational History   Not on file  Tobacco Use   Smoking status: Never   Smokeless tobacco: Never  Vaping Use   Vaping Use: Never used  Substance and Sexual Activity   Alcohol use: Yes    Alcohol/week: 1.0 standard drink of alcohol    Types: 1 Standard drinks or equivalent per week   Drug use: No   Sexual  activity: Yes    Birth control/protection: None  Other Topics Concern   Not on file  Social History Narrative   Not on file   Social Determinants of Health   Financial Resource Strain: Not on file  Food Insecurity: Not on file  Transportation Needs: Not on file  Physical Activity: Not on file  Stress: Not on file  Social Connections: Not on file   Family History  Problem Relation Age of Onset   Hypertension Mother    Hypothyroidism Mother    Arthritis Mother    COPD Father    Colon cancer Sister 83   Colon polyps Sister 25   Cervical cancer Sister    Stomach cancer Paternal Aunt    Stomach cancer Paternal Aunt    Esophageal cancer Neg Hx    Rectal cancer Neg Hx    No Known Allergies Prior to Admission medications   Medication Sig Start Date End Date Taking? Authorizing Provider  albuterol (VENTOLIN HFA) 108 (90 Base) MCG/ACT inhaler Inhale 2 puffs into the lungs every 6 (six) hours as needed for wheezing or shortness of breath. 04/06/19   Hassell Done, Mary-Margaret, FNP  fluticasone (FLONASE) 50 MCG/ACT nasal spray Place 2 sprays into both nostrils daily. Patient taking differently: Place 2 sprays into both nostrils daily as needed. 07/03/19   Chevis Pretty, [place]  levothyroxine (SYNTHROID) 125 MCG tablet Take 1 tablet by mouth every morning 30 minutes before food. 03/15/22   McDiarmid, Blane Ohara, MD  losartan (COZAAR) 50 MG tablet Take 1 tablet (50 mg total) by mouth at bedtime. 04/17/21   Zenia Resides, MD  norethindrone (ORTHO MICRONOR) 0.35 MG tablet Take 1 tablet (0.35 mg total) by mouth daily. 02/15/22   Zenia Resides, MD  sertraline (ZOLOFT) 100 MG tablet TAKE 1 TABLET DAILY (NEED APPOINTMENT BEFORE DUE FOR ADDITIONAL REFILLS) 07/24/21   Lenoria Chime, MD    ROS: All other systems have been reviewed and were otherwise negative with the exception of those mentioned in the HPI and as above.  Physical Exam: General: Alert, no acute distress Cardiovascular: No pedal  edema Respiratory: No cyanosis, no use of accessory musculature GI: No organomegaly, abdomen is soft and non-tender Skin: No lesions in the area of chief complaint Neurologic: Sensation intact distally Psychiatric: Patient is competent for consent with normal mood and affect Lymphatic: No axillary or cervical lymphadenopathy  MUSCULOSKELETAL:  Active forward elevation to 170.  She has no weakness with cuff testing.  She has grossly positive O'Brien's.  Positive posterior load shift.  Positive impingement.  AC tenderness to palpation is positive as well.    Imaging: MRI reviewed and demonstrates a superior posterior labral tear to my eye and a large spinal glenoid notch cyst.  Assessment: LEFT SHOULDER CARTILAGE DISORDER, OA, IMPINGEMENT SYNDROME, BICEPS TENDINITIS  Plan: Plan for Procedure(s): SHOULDER ARTHROSCOPY WITH SUBACROMIAL DECOMPRESSION, ROTATOR CUFF REPAIR AND BICEP TENDON REPAIR SHOULDER ARTHROSCOPY WITH DISTAL CLAVICLE EXCISION  The risks benefits and alternatives were discussed with the patient including but not limited to the risks of nonoperative treatment, versus surgical intervention including infection, bleeding, nerve injury,  blood clots, cardiopulmonary complications, morbidity, mortality, among others, and they were willing to proceed.   The patient acknowledged the explanation, agreed to proceed with the plan and consent was signed.   Operative Plan: Left shoulder scope with SAD, DCE, BT versus labral repair Discharge Medications: standard DVT Prophylaxis: none Physical Therapy: outpatient PT.  Special Discharge needs: sling. Jeneen Montgomery, [place]  03/27/2022 8:20 PM

## 2022-03-29 ENCOUNTER — Encounter (HOSPITAL_BASED_OUTPATIENT_CLINIC_OR_DEPARTMENT_OTHER): Payer: Self-pay | Admitting: Orthopaedic Surgery

## 2022-03-29 ENCOUNTER — Other Ambulatory Visit: Payer: Self-pay

## 2022-04-02 ENCOUNTER — Encounter (HOSPITAL_BASED_OUTPATIENT_CLINIC_OR_DEPARTMENT_OTHER)
Admission: RE | Admit: 2022-04-02 | Discharge: 2022-04-02 | Disposition: A | Discharge status: Home / Self Care | Payer: BC Managed Care – PPO | Source: Ambulatory Visit | Attending: Orthopaedic Surgery | Admitting: Orthopaedic Surgery

## 2022-04-02 DIAGNOSIS — X58XXXA Exposure to other specified factors, initial encounter: Secondary | ICD-10-CM | POA: Diagnosis not present

## 2022-04-02 DIAGNOSIS — Z8249 Family history of ischemic heart disease and other diseases of the circulatory system: Secondary | ICD-10-CM | POA: Diagnosis not present

## 2022-04-02 DIAGNOSIS — S43432A Superior glenoid labrum lesion of left shoulder, initial encounter: Secondary | ICD-10-CM | POA: Diagnosis not present

## 2022-04-02 DIAGNOSIS — M19012 Primary osteoarthritis, left shoulder: Secondary | ICD-10-CM | POA: Diagnosis not present

## 2022-04-02 DIAGNOSIS — Z6835 Body mass index (BMI) 35.0-35.9, adult: Secondary | ICD-10-CM | POA: Diagnosis not present

## 2022-04-02 DIAGNOSIS — E669 Obesity, unspecified: Secondary | ICD-10-CM | POA: Diagnosis not present

## 2022-04-02 DIAGNOSIS — M7522 Bicipital tendinitis, left shoulder: Secondary | ICD-10-CM | POA: Diagnosis not present

## 2022-04-02 DIAGNOSIS — E039 Hypothyroidism, unspecified: Secondary | ICD-10-CM | POA: Diagnosis not present

## 2022-04-02 DIAGNOSIS — I10 Essential (primary) hypertension: Secondary | ICD-10-CM | POA: Diagnosis not present

## 2022-04-02 DIAGNOSIS — K219 Gastro-esophageal reflux disease without esophagitis: Secondary | ICD-10-CM | POA: Diagnosis not present

## 2022-04-02 DIAGNOSIS — Z01810 Encounter for preprocedural cardiovascular examination: Secondary | ICD-10-CM | POA: Insufficient documentation

## 2022-04-02 NOTE — Progress Notes (Signed)
Surgical soap given with instructions, pt verbalized understanding. Benzoyl peroxide gel given with written instructions, pt verbalized understanding.

## 2022-04-05 ENCOUNTER — Ambulatory Visit (HOSPITAL_BASED_OUTPATIENT_CLINIC_OR_DEPARTMENT_OTHER)
Admission: RE | Admit: 2022-04-05 | Discharge: 2022-04-05 | Disposition: A | Discharge status: Home / Self Care | Payer: BC Managed Care – PPO | Attending: Orthopaedic Surgery | Admitting: Orthopaedic Surgery

## 2022-04-05 ENCOUNTER — Encounter (HOSPITAL_BASED_OUTPATIENT_CLINIC_OR_DEPARTMENT_OTHER)
Admission: RE | Disposition: A | Discharge status: Home / Self Care | Payer: Self-pay | Source: Home / Self Care | Attending: Orthopaedic Surgery

## 2022-04-05 ENCOUNTER — Other Ambulatory Visit: Payer: Self-pay

## 2022-04-05 ENCOUNTER — Ambulatory Visit (HOSPITAL_BASED_OUTPATIENT_CLINIC_OR_DEPARTMENT_OTHER): Payer: BC Managed Care – PPO | Admitting: Anesthesiology

## 2022-04-05 ENCOUNTER — Encounter (HOSPITAL_BASED_OUTPATIENT_CLINIC_OR_DEPARTMENT_OTHER): Payer: Self-pay | Admitting: Orthopaedic Surgery

## 2022-04-05 DIAGNOSIS — M19012 Primary osteoarthritis, left shoulder: Secondary | ICD-10-CM | POA: Insufficient documentation

## 2022-04-05 DIAGNOSIS — Z8249 Family history of ischemic heart disease and other diseases of the circulatory system: Secondary | ICD-10-CM | POA: Diagnosis not present

## 2022-04-05 DIAGNOSIS — E669 Obesity, unspecified: Secondary | ICD-10-CM | POA: Insufficient documentation

## 2022-04-05 DIAGNOSIS — G5682 Other specified mononeuropathies of left upper limb: Secondary | ICD-10-CM | POA: Diagnosis not present

## 2022-04-05 DIAGNOSIS — M7542 Impingement syndrome of left shoulder: Secondary | ICD-10-CM | POA: Diagnosis not present

## 2022-04-05 DIAGNOSIS — E039 Hypothyroidism, unspecified: Secondary | ICD-10-CM | POA: Diagnosis not present

## 2022-04-05 DIAGNOSIS — K219 Gastro-esophageal reflux disease without esophagitis: Secondary | ICD-10-CM | POA: Diagnosis not present

## 2022-04-05 DIAGNOSIS — I10 Essential (primary) hypertension: Secondary | ICD-10-CM | POA: Insufficient documentation

## 2022-04-05 DIAGNOSIS — G8918 Other acute postprocedural pain: Secondary | ICD-10-CM | POA: Diagnosis not present

## 2022-04-05 DIAGNOSIS — Z6835 Body mass index (BMI) 35.0-35.9, adult: Secondary | ICD-10-CM | POA: Diagnosis not present

## 2022-04-05 DIAGNOSIS — S43432A Superior glenoid labrum lesion of left shoulder, initial encounter: Secondary | ICD-10-CM | POA: Insufficient documentation

## 2022-04-05 DIAGNOSIS — M7552 Bursitis of left shoulder: Secondary | ICD-10-CM | POA: Diagnosis not present

## 2022-04-05 DIAGNOSIS — M7522 Bicipital tendinitis, left shoulder: Secondary | ICD-10-CM | POA: Diagnosis not present

## 2022-04-05 DIAGNOSIS — X58XXXA Exposure to other specified factors, initial encounter: Secondary | ICD-10-CM | POA: Diagnosis not present

## 2022-04-05 DIAGNOSIS — Z01818 Encounter for other preprocedural examination: Secondary | ICD-10-CM

## 2022-04-05 DIAGNOSIS — M24112 Other articular cartilage disorders, left shoulder: Secondary | ICD-10-CM | POA: Diagnosis not present

## 2022-04-05 HISTORY — PX: SHOULDER ARTHROSCOPY WITH SUBACROMIAL DECOMPRESSION, ROTATOR CUFF REPAIR AND BICEP TENDON REPAIR: SHX5687

## 2022-04-05 HISTORY — PX: SHOULDER ARTHROSCOPY WITH DISTAL CLAVICLE RESECTION: SHX5675

## 2022-04-05 LAB — POCT PREGNANCY, URINE: Preg Test, Ur: NEGATIVE

## 2022-04-05 SURGERY — SHOULDER ARTHROSCOPY WITH SUBACROMIAL DECOMPRESSION, ROTATOR CUFF REPAIR AND BICEP TENDON REPAIR
Anesthesia: Regional | Site: Shoulder | Laterality: Left

## 2022-04-05 MED ORDER — SUGAMMADEX SODIUM 200 MG/2ML IV SOLN
INTRAVENOUS | Status: DC | PRN
  Administered 2022-04-05: 200 mg via INTRAVENOUS

## 2022-04-05 MED ORDER — EPHEDRINE SULFATE (PRESSORS) 50 MG/ML IJ SOLN
INTRAMUSCULAR | Status: DC | PRN
  Administered 2022-04-05: 10 mg via INTRAVENOUS

## 2022-04-05 MED ORDER — CEFAZOLIN SODIUM-DEXTROSE 2-4 GM/100ML-% IV SOLN
2.0000 g | INTRAVENOUS | Status: AC
  Administered 2022-04-05: 2 g via INTRAVENOUS

## 2022-04-05 MED ORDER — AMISULPRIDE (ANTIEMETIC) 5 MG/2ML IV SOLN: 10.0000 mg | Freq: Once | INTRAVENOUS | Status: DC | PRN

## 2022-04-05 MED ORDER — DEXAMETHASONE SODIUM PHOSPHATE 4 MG/ML IJ SOLN
INTRAMUSCULAR | Status: DC | PRN
  Administered 2022-04-05: 5 mg via INTRAVENOUS

## 2022-04-05 MED ORDER — CEFAZOLIN SODIUM-DEXTROSE 2-4 GM/100ML-% IV SOLN
INTRAVENOUS | Status: AC
  Filled 2022-04-05: qty 100

## 2022-04-05 MED ORDER — ROCURONIUM BROMIDE 10 MG/ML (PF) SYRINGE
PREFILLED_SYRINGE | INTRAVENOUS | Status: AC
  Filled 2022-04-05: qty 10

## 2022-04-05 MED ORDER — PHENYLEPHRINE HCL (PRESSORS) 10 MG/ML IV SOLN
INTRAVENOUS | Status: DC | PRN
  Administered 2022-04-05 (×2): 240 ug via INTRAVENOUS
  Administered 2022-04-05: 80 ug via INTRAVENOUS
  Administered 2022-04-05: 240 ug via INTRAVENOUS
  Administered 2022-04-05: 80 ug via INTRAVENOUS
  Administered 2022-04-05 (×2): 160 ug via INTRAVENOUS
  Administered 2022-04-05: 80 ug via INTRAVENOUS
  Administered 2022-04-05: 160 ug via INTRAVENOUS
  Administered 2022-04-05: 80 ug via INTRAVENOUS

## 2022-04-05 MED ORDER — BUPIVACAINE LIPOSOME 1.3 % IJ SUSP
INTRAMUSCULAR | Status: DC | PRN
  Administered 2022-04-05: 10 mL via PERINEURAL

## 2022-04-05 MED ORDER — ACETAMINOPHEN 500 MG PO TABS
ORAL_TABLET | ORAL | Status: AC
  Filled 2022-04-05: qty 2

## 2022-04-05 MED ORDER — GABAPENTIN 300 MG PO CAPS
300.0000 mg | ORAL_CAPSULE | Freq: Once | ORAL | Status: AC
  Administered 2022-04-05: 300 mg via ORAL

## 2022-04-05 MED ORDER — ACETAMINOPHEN 500 MG PO TABS: 1000.0000 mg | ORAL_TABLET | Freq: Three times a day (TID) | ORAL | 0 refills | Status: AC

## 2022-04-05 MED ORDER — CELECOXIB 100 MG PO CAPS: 100.0000 mg | ORAL_CAPSULE | Freq: Two times a day (BID) | ORAL | 0 refills | Status: AC

## 2022-04-05 MED ORDER — OXYCODONE HCL 5 MG PO TABS: 5.0000 mg | ORAL_TABLET | Freq: Once | ORAL | Status: DC | PRN

## 2022-04-05 MED ORDER — PROMETHAZINE HCL 25 MG/ML IJ SOLN: 6.2500 mg | INTRAMUSCULAR | Status: DC | PRN

## 2022-04-05 MED ORDER — FENTANYL CITRATE (PF) 100 MCG/2ML IJ SOLN
INTRAMUSCULAR | Status: AC
  Filled 2022-04-05: qty 2

## 2022-04-05 MED ORDER — MIDAZOLAM HCL 2 MG/2ML IJ SOLN
2.0000 mg | Freq: Once | INTRAMUSCULAR | Status: AC
  Administered 2022-04-05: 2 mg via INTRAVENOUS

## 2022-04-05 MED ORDER — TRANEXAMIC ACID-NACL 1000-0.7 MG/100ML-% IV SOLN
INTRAVENOUS | Status: AC
  Filled 2022-04-05: qty 100

## 2022-04-05 MED ORDER — GABAPENTIN 300 MG PO CAPS
ORAL_CAPSULE | ORAL | Status: AC
  Filled 2022-04-05: qty 1

## 2022-04-05 MED ORDER — DEXAMETHASONE SODIUM PHOSPHATE 10 MG/ML IJ SOLN
INTRAMUSCULAR | Status: AC
  Filled 2022-04-05: qty 1

## 2022-04-05 MED ORDER — ONDANSETRON HCL 4 MG PO TABS: 4.0000 mg | ORAL_TABLET | Freq: Three times a day (TID) | ORAL | 0 refills | Status: AC | PRN

## 2022-04-05 MED ORDER — BUPIVACAINE HCL (PF) 0.5 % IJ SOLN
INTRAMUSCULAR | Status: DC | PRN
  Administered 2022-04-05: 15 mL via PERINEURAL

## 2022-04-05 MED ORDER — FENTANYL CITRATE (PF) 100 MCG/2ML IJ SOLN
INTRAMUSCULAR | Status: DC | PRN
  Administered 2022-04-05: 100 ug via INTRAVENOUS

## 2022-04-05 MED ORDER — FENTANYL CITRATE (PF) 100 MCG/2ML IJ SOLN: 25.0000 ug | INTRAMUSCULAR | Status: DC | PRN

## 2022-04-05 MED ORDER — ACETAMINOPHEN 500 MG PO TABS
1000.0000 mg | ORAL_TABLET | Freq: Once | ORAL | Status: AC
  Administered 2022-04-05: 1000 mg via ORAL

## 2022-04-05 MED ORDER — OXYCODONE HCL 5 MG PO TABS: ORAL_TABLET | ORAL | 0 refills | Status: AC

## 2022-04-05 MED ORDER — ROCURONIUM BROMIDE 100 MG/10ML IV SOLN
INTRAVENOUS | Status: DC | PRN
  Administered 2022-04-05: 40 mg via INTRAVENOUS

## 2022-04-05 MED ORDER — PROPOFOL 10 MG/ML IV BOLUS
INTRAVENOUS | Status: DC | PRN
  Administered 2022-04-05: 200 mg via INTRAVENOUS

## 2022-04-05 MED ORDER — MIDAZOLAM HCL 2 MG/2ML IJ SOLN
INTRAMUSCULAR | Status: AC
  Filled 2022-04-05: qty 2

## 2022-04-05 MED ORDER — ONDANSETRON HCL 4 MG/2ML IJ SOLN
INTRAMUSCULAR | Status: DC | PRN
  Administered 2022-04-05: 4 mg via INTRAVENOUS

## 2022-04-05 MED ORDER — FENTANYL CITRATE (PF) 100 MCG/2ML IJ SOLN
100.0000 ug | Freq: Once | INTRAMUSCULAR | Status: AC
  Administered 2022-04-05: 50 ug via INTRAVENOUS

## 2022-04-05 MED ORDER — LACTATED RINGERS IV SOLN: INTRAVENOUS | Status: DC

## 2022-04-05 MED ORDER — KETOROLAC TROMETHAMINE 30 MG/ML IJ SOLN: 30.0000 mg | Freq: Once | INTRAMUSCULAR | Status: DC | PRN

## 2022-04-05 MED ORDER — PROPOFOL 10 MG/ML IV BOLUS
INTRAVENOUS | Status: AC
  Filled 2022-04-05: qty 20

## 2022-04-05 MED ORDER — TRANEXAMIC ACID-NACL 1000-0.7 MG/100ML-% IV SOLN
1000.0000 mg | INTRAVENOUS | Status: AC
  Administered 2022-04-05: 1000 mg via INTRAVENOUS

## 2022-04-05 MED ORDER — OXYCODONE HCL 5 MG/5ML PO SOLN: 5.0000 mg | Freq: Once | ORAL | Status: DC | PRN

## 2022-04-05 MED ORDER — ONDANSETRON HCL 4 MG/2ML IJ SOLN
INTRAMUSCULAR | Status: AC
  Filled 2022-04-05: qty 2

## 2022-04-05 SURGICAL SUPPLY — 53 items
AID PSTN UNV HD RSTRNT DISP (MISCELLANEOUS) ×1
ANCH SUT 2 FBRTK KNTLS 1.8 (Anchor) ×2 IMPLANT
ANCHOR SUT 1.8 FIBERTAK SB KL (Anchor) IMPLANT
APL PRP STRL LF DISP 70% ISPRP (MISCELLANEOUS) ×1
BLADE EXCALIBUR 4.0X13 (MISCELLANEOUS) ×1 IMPLANT
BURR OVAL 8 FLU 4.0X13 (MISCELLANEOUS) ×1 IMPLANT
CANNULA 5.75X71 LONG (CANNULA) IMPLANT
CANNULA PASSPORT 5 (CANNULA) IMPLANT
CANNULA PASSPORT BUTTON 10-40 (CANNULA) IMPLANT
CANNULA TWIST IN 8.25X7CM (CANNULA) IMPLANT
CHLORAPREP W/TINT 26 (MISCELLANEOUS) ×1 IMPLANT
CLSR STERI-STRIP ANTIMIC 1/2X4 (GAUZE/BANDAGES/DRESSINGS) ×1 IMPLANT
COOLER ICEMAN CLASSIC (MISCELLANEOUS) ×1 IMPLANT
DRAPE IMP U-DRAPE 54X76 (DRAPES) ×1 IMPLANT
DRAPE INCISE IOBAN 66X45 STRL (DRAPES) IMPLANT
DRAPE SHOULDER BEACH CHAIR (DRAPES) ×1 IMPLANT
DW OUTFLOW CASSETTE/TUBE SET (MISCELLANEOUS) ×1 IMPLANT
GAUZE PAD ABD 8X10 STRL (GAUZE/BANDAGES/DRESSINGS) ×1 IMPLANT
GAUZE SPONGE 4X4 12PLY STRL (GAUZE/BANDAGES/DRESSINGS) ×1 IMPLANT
GLOVE BIO SURGEON STRL SZ 6.5 (GLOVE) ×1 IMPLANT
GLOVE BIOGEL PI IND STRL 6.5 (GLOVE) ×1 IMPLANT
GLOVE BIOGEL PI IND STRL 8 (GLOVE) ×1 IMPLANT
GLOVE ECLIPSE 8.0 STRL XLNG CF (GLOVE) ×1 IMPLANT
GOWN STRL REUS W/ TWL LRG LVL3 (GOWN DISPOSABLE) ×2 IMPLANT
GOWN STRL REUS W/TWL LRG LVL3 (GOWN DISPOSABLE) ×2
GOWN STRL REUS W/TWL XL LVL3 (GOWN DISPOSABLE) ×1 IMPLANT
KIT STABILIZATION SHOULDER (MISCELLANEOUS) ×1 IMPLANT
KIT STR SPEAR 1.8 FBRTK DISP (KITS) IMPLANT
LASSO CRESCENT QUICKPASS (SUTURE) IMPLANT
MANIFOLD NEPTUNE II (INSTRUMENTS) ×1 IMPLANT
NDL HD SCORPION MEGA LOADER (NEEDLE) IMPLANT
NDL SAFETY ECLIP 18X1.5 (MISCELLANEOUS) ×1 IMPLANT
PACK ARTHROSCOPY DSU (CUSTOM PROCEDURE TRAY) ×1 IMPLANT
PACK BASIN DAY SURGERY FS (CUSTOM PROCEDURE TRAY) ×1 IMPLANT
PAD COLD SHLDR WRAP-ON (PAD) ×1 IMPLANT
PORT APPOLLO RF 90DEGREE MULTI (SURGICAL WAND) ×1 IMPLANT
RESTRAINT HEAD UNIVERSAL NS (MISCELLANEOUS) ×1 IMPLANT
SHEET MEDIUM DRAPE 40X70 STRL (DRAPES) ×1 IMPLANT
SLEEVE SCD COMPRESS KNEE MED (STOCKING) ×1 IMPLANT
SLING ARM FOAM STRAP LRG (SOFTGOODS) IMPLANT
SUT FIBERWIRE #2 38 T-5 BLUE (SUTURE)
SUT MNCRL AB 4-0 PS2 18 (SUTURE) ×1 IMPLANT
SUT PDS AB 1 CT  36 (SUTURE)
SUT PDS AB 1 CT 36 (SUTURE) IMPLANT
SUT TIGER TAPE 7 IN WHITE (SUTURE) IMPLANT
SUTURE FIBERWR #2 38 T-5 BLUE (SUTURE) IMPLANT
SUTURE TAPE TIGERLINK 1.3MM BL (SUTURE) IMPLANT
SUTURETAPE TIGERLINK 1.3MM BL (SUTURE)
SYR 5ML LL (SYRINGE) ×1 IMPLANT
TAPE FIBER 2MM 7IN #2 BLUE (SUTURE) IMPLANT
TOWEL GREEN STERILE FF (TOWEL DISPOSABLE) ×2 IMPLANT
TUBE CONNECTING 20X1/4 (TUBING) ×1 IMPLANT
TUBING ARTHROSCOPY IRRIG 16FT (MISCELLANEOUS) ×1 IMPLANT

## 2022-04-05 NOTE — Op Note (Signed)
Orthopaedic Surgery Operative Note (CSN: [id number])  Karen Duncan  02-Nov-1975 Date of Surgery: 04/05/2022   DIAGNOSES: Left shoulder, SLAP tear, biceps tendinitis, AC arthritis, subacromial impingement, and suprascapular notch cyst .  POST-OPERATIVE DIAGNOSIS: same  PROCEDURE: Arthroscopic extensive debridement - 29823 Subdeltoid Bursa, Supraspinatus Tendon, Anterior Labrum, Superior Labrum, and glenoid bone, glenoid cartilage, humeral bone Arthroscopic distal clavicle excision - 53664 Arthroscopic subacromial decompression - 40347 Arthroscopic biceps tenodesis - 42595   OPERATIVE FINDING: Exam under anesthesia: Normal Articular space:  Anterior and superior labral fraying, extended down to the 2 o'clock position. Chondral surfaces: Normal Biceps:  Type II SLAP tear Subscapularis: Intact sniffing and redness in the anterior interval Supraspinatus: Intact  Infraspinatus: Intact      Post-operative plan: The patient will be non-weightbearing in a sling 4 weeks.  The patient will be discharged home.  DVT prophylaxis not indicated in ambulatory upper extremity patient without known risk factors.   Pain control with PRN pain medication preferring oral medicines.  Follow up plan will be scheduled in approximately 7 days for incision check and XR.  Surgeons:Primary: Hiram Gash, MD Assistants:Caroline McBane PA-C Location: [place] OR ROOM 1 Anesthesia: General with Exparel interscalene block Antibiotics: Ancef 2 g Tourniquet time: None Estimated Blood Loss: Minimal Complications: None Specimens: None Implants: Implant Name Type Inv. Item Serial No. Manufacturer Lot No. LRB No. Used Action  ANCHOR SUT 1.8 FIBERTAK SB KL - [id number] Anchor ANCHOR SUT 1.8 FIBERTAK SB KL  [employer] [id number] Left 1 Implanted  ANCHOR SUT 1.8 FIBERTAK SB KL - [id number] Anchor ANCHOR SUT 1.8 [employer] [id number] Left 1 Implanted    Indications for Surgery:   Karen Duncan is a 47  y.o. female with continued shoulder pain refractory to nonoperative measures for extended period of time.    Patient has spinal glenoid notch cyst and continued pain.  Aspiration attempts failed.  The risks and benefits were explained at length including but not limited to continued pain, cuff failure, biceps tenodesis failure, stiffness, need for further surgery and infection.   Procedure:   Patient was correctly identified in the preoperative holding area and operative site marked.  Patient brought to OR and positioned beachchair on an Bedias table ensuring that all bony prominences were padded and the head was in an appropriate location.  Anesthesia was induced and the operative shoulder was prepped and draped in the usual sterile fashion.  Timeout was called preincision.  A standard posterior viewing portal was made after localizing the portal with a spinal needle.  An anterior accessory portal was also made.  After clearing the articular space the camera was positioned in the subacromial space.  Findings above.    Extensive debridement was performed of the anterior interval tissue, labral fraying and the bursa.  Glenoid bone, glenoid cartilage in the superior labrum were completely debrided to avoid a 1 way valve.  Biceps tenotomy performed.  We then were able to make an accessory trans rotator cuff portal using blunt instruments medial to the rotator crescent in the muscular part of the rotator cuff.  We placed the camera in this portal and used blunt dissection to perform a capsulotomy and dissected the spinal glenoid notch.  The cyst was encountered and the most lateral aspect of the wall resected and the gelatinous fluid consistent with a cyst was noted to be cleared.  We cleared the cyst completely.  Subacromial decompression: We made a lateral portal with spinal needle guidance.  We then proceeded to debride bursal tissue extensively with a shaver and arthrocare device. At that point we continued  to identify the borders of the acromion and identify the spur. We then carefully preserved the deltoid fascia and used a burr to convert the acromion to a Type 1 flat acromion without issue.  Biceps tenodesis: We marked the tendon and then performed a tenotomy and debridement of the stump in the articular space. We then identified the biceps tendon in its groove suprapec with the arthroscope in the lateral portal taking care to move from lateral to medial to avoid injury to the subscapularis. At that point we unroofed the tendon itself and mobilized it. An accessory anterior portal was made in line with the tendon and we grasped it from the anterior superior portal and worked from the accessory anterior portal. Two Fibertak 1.53m knotless anchors were placed in the groove and the tendon was secured in a luggage loop style fashion with a pass of the limb of suture through the tendon using a scorpion device to avoid pull-through.  Repair was completed with good tension on the tendon.  Residual stump of the tendon was removed after being resected with a RF ablator.  Distal Clavicle resection:  The scope was placed in the subacromial space from the posterior portal.  A hemostat was placed through the anterior portal and we spread at the [facility].  A burr was then inserted and 10 mm of distal clavicle was resected taking care to avoid damage to the capsule around the joint and avoiding overhanging bone posteriorly.     The incisions were closed with absorbable monocryl and steri strips.  A sterile dressing was placed along with a sling. The patient was awoken from general anesthesia and taken to the PACU in stable condition without complication.   CNoemi Chapel PA-C, present and scrubbed throughout the case, critical for completion in a timely fashion, and for retraction, instrumentation, closure.

## 2022-04-05 NOTE — Discharge Instructions (Addendum)
Ophelia Charter MD, MPH Noemi Chapel, PA-C [facility] [address], [address] [phone] (tel)   [phone] (fax)   POST-OPERATIVE INSTRUCTIONS - SHOULDER ARTHROSCOPY  WOUND CARE You may remove the Operative Dressing on Post-Op Day #3 (72hrs after surgery).   Alternatively if you would like you can leave dressing on until follow-up if within 7-8 days but keep it dry. Leave steri-strips in place until they fall off on their own, usually 2 weeks postop. There may be a small amount of fluid/bleeding leaking at the surgical site.  This is normal; the shoulder is filled with fluid during the procedure and can leak for 24-48hrs after surgery.  You may change/reinforce the bandage as needed.  Use the Cryocuff or Ice as often as possible for the first 7 days, then as needed for pain relief. Always keep a towel, ACE wrap or other barrier between the cooling unit and your skin.  You may shower on Post-Op Day #3. Gently pat the area dry.  Do not soak the shoulder in water or submerge it.  Keep incisions as dry as possible. Do not go swimming in the pool or ocean until 4 weeks after surgery or when otherwise instructed.    EXERCISES Wear the sling at all times  You may remove the sling for showering, but keep the arm across the chest or in a secondary sling.     It is normal for your fingers/hand to become more swollen after surgery and discolored from bruising.   This will resolve over the first few weeks usually after surgery. Please continue to ambulate and do not stay sitting or lying for too long.  Perform foot and wrist pumps to assist in circulation.  PHYSICAL THERAPY - You will begin physical therapy soon after surgery (unless otherwise specified) - Please call to set up an appointment, if you do not already have one  - Let our office if there are any issues with scheduling your therapy  - You have a physical therapy appointment scheduled at [facility] (across  the hall from our office) on Tuesday January 30th at 9:30am   [place] ([place]) The anesthesia team may have performed a nerve block for you this is a great tool used to minimize pain.   The block may start wearing off overnight (between 8-24 hours postop) When the block wears off, your pain may go from nearly zero to the pain you would have had postop without the block. This is an abrupt transition but nothing dangerous is happening.   This can be a challenging period but utilize your as needed pain medications to try and manage this period. We suggest you use the pain medication the first night prior to going to bed, to ease this transition.  You may take an extra dose of narcotic when this happens if needed  POST-OP MEDICATIONS- Multimodal approach to pain control In general your pain will be controlled with a combination of substances.  Prescriptions unless otherwise discussed are electronically sent to your pharmacy.  This is a carefully made plan we use to minimize narcotic use.     Celebrex - Anti-inflammatory medication taken on a scheduled basis Acetaminophen - Non-narcotic pain medicine taken on a scheduled basis. No Tylenol until after 12:40pm today. Oxycodone - This is a strong narcotic, to be used only on an "as needed" basis for SEVERE pain. Zofran - take as needed for nausea  FOLLOW-UP If you develop a Fever (?101.5), Redness or  Drainage from the surgical incision site, please call our office to arrange for an evaluation. Please call the office to schedule a follow-up appointment for your first post-operative appointment, 7-10 days post-operatively.    HELPFUL INFORMATION   You may be more comfortable sleeping in a semi-seated position the first few nights following surgery.  Keep a pillow propped under the elbow and forearm for comfort.  If you have a recliner type of chair it might be beneficial.  If not that is fine too, but it would be helpful to sleep  propped up with pillows behind your operated shoulder as well under your elbow and forearm.  This will reduce pulling on the suture lines.  When dressing, put your operative arm in the sleeve first.  When getting undressed, take your operative arm out last.  Loose fitting, button-down shirts are recommended.  Often in the first days after surgery you may be more comfortable keeping your operative arm under your shirt and not through the sleeve.  You may return to work/school in the next couple of days when you feel up to it.  Desk work and typing in the sling is fine.  We suggest you use the pain medication the first night prior to going to bed, in order to ease any pain when the anesthesia wears off. You should avoid taking pain medications on an empty stomach as it will make you nauseous.  You should wean off your narcotic medicines as soon as you are able.  Most patients are off narcotics by post-op day 4. You should be off narcotics by your first post-operative visit  Do not drink alcoholic beverages or take illicit drugs when taking pain medications.  It is against the law to drive while taking narcotics.  In some states it is against the law to drive while your arm is in a sling.   Pain medication may make you constipated.  Below are a few solutions to try in this order: Decrease the amount of pain medication if you aren't having pain. Drink lots of decaffeinated fluids. Drink prune juice and/or eat dried prunes  If the first 3 don't work start with additional solutions Take Colace - an over-the-counter stool softener Take Senokot - an over-the-counter laxative Take Miralax - a stronger over-the-counter laxative  For more information including helpful videos and documents visit our website:   [email or web address]    Post Anesthesia Home Care Instructions  Activity: Get plenty of rest for the remainder of the day. A responsible individual must  stay with you for 24 hours following the procedure.  For the next 24 hours, DO NOT: -Drive a car -Paediatric nurse -Drink alcoholic beverages -Take any medication unless instructed by your physician -Make any legal decisions or sign important papers.  Meals: Start with liquid foods such as gelatin or soup. Progress to regular foods as tolerated. Avoid greasy, spicy, heavy foods. If nausea and/or vomiting occur, drink only clear liquids until the nausea and/or vomiting subsides. Call your physician if vomiting continues.  Special Instructions/Symptoms: Your throat may feel dry or sore from the anesthesia or the breathing tube placed in your throat during surgery. If this causes discomfort, gargle with warm salt water. The discomfort should disappear within 24 hours.  If you had a scopolamine patch placed behind your ear for the management of post- operative nausea and/or vomiting:  1. The medication in the patch is effective for 72 hours, after which it should be removed.  Wrap patch in a tissue  and discard in the trash. Wash hands thoroughly with soap and water. 2. You may remove the patch earlier than 72 hours if you experience unpleasant side effects which may include dry mouth, dizziness or visual disturbances. 3. Avoid touching the patch. Wash your hands with soap and water after contact with the patch.   Regional Anesthesia Blocks  1. Numbness or the inability to move the "blocked" extremity may last from 3-48 hours after placement. The length of time depends on the medication injected and your individual response to the medication. If the numbness is not going away after 48 hours, call your surgeon.  2. The extremity that is blocked will need to be protected until the numbness is gone and the  Strength has returned. Because you cannot feel it, you will need to take extra care to avoid injury. Because it may be weak, you may have difficulty moving it or using it. You may not know what  position it is in without looking at it while the block is in effect.  3. For blocks in the legs and feet, returning to weight bearing and walking needs to be done carefully. You will need to wait until the numbness is entirely gone and the strength has returned. You should be able to move your leg and foot normally before you try and bear weight or walk. You will need someone to be with you when you first try to ensure you do not fall and possibly risk injury.  4. Bruising and tenderness at the needle site are common side effects and will resolve in a few days.  5. Persistent numbness or new problems with movement should be communicated to the surgeon or the [facility] [phone] [place] [phone]).Information for Discharge Teaching: EXPAREL (bupivacaine liposome injectable suspension)   Your surgeon or anesthesiologist gave you EXPAREL(bupivacaine) to help control your pain after surgery.  EXPAREL is a local anesthetic that provides pain relief by numbing the tissue around the surgical site. EXPAREL is designed to release pain medication over time and can control pain for up to 72 hours. Depending on how you respond to EXPAREL, you may require less pain medication during your recovery.  Possible side effects: Temporary loss of sensation or ability to move in the area where bupivacaine was injected. Nausea, vomiting, constipation Rarely, numbness and tingling in your mouth or lips, lightheadedness, or anxiety may occur. Call your doctor right away if you think you may be experiencing any of these sensations, or if you have other questions regarding possible side effects.  Follow all other discharge instructions given to you by your surgeon or nurse. Eat a healthy diet and drink plenty of water or other fluids.  If you return to the hospital for any reason within 96 hours following the administration of EXPAREL, it is important for health care providers to  know that you have received this anesthetic. A teal colored band has been placed on your arm with the date, time and amount of EXPAREL you have received in order to alert and inform your health care providers. Please leave this armband in place for the full 96 hours following administration, and then you may remove the band.[employer] Ultrasling IV/Ultrasling IIIER:  Please contact your surgeon if you have questions or concerns about your sling.

## 2022-04-05 NOTE — Progress Notes (Signed)
Assisted Dr. Roanna Banning with left, interscalene, ultrasound guided block. Side rails up, monitors on throughout procedure. See vital signs in flow sheet. Tolerated Procedure well.

## 2022-04-05 NOTE — Transfer of Care (Signed)
Immediate Anesthesia Transfer of Care Note  Patient: Karen Duncan  Procedure(s) Performed: SHOULDER ARTHROSCOPY WITH SUBACROMIAL DECOMPRESSION, AND BICEP TENDON REPAIR (Left: Shoulder) SHOULDER ARTHROSCOPY WITH DISTAL CLAVICLE EXCISION (Left: Shoulder)  Patient Location: PACU  Anesthesia Type:General and Regional  Level of Consciousness: awake, alert , and oriented  Airway & Oxygen Therapy: Patient Spontanous Breathing and Patient connected to face mask oxygen  Post-op Assessment: Report given to RN and Post -op Vital signs reviewed and stable  Post vital signs: Reviewed and stable  Last Vitals:  Vitals Value Taken Time  BP 153/89 04/05/22 0840  Temp 36.3 C 04/05/22 0840  Pulse 115 04/05/22 0841  Resp 25 04/05/22 0841  SpO2 92 % 04/05/22 0841  Vitals shown include unvalidated device data.  Last Pain:  Vitals:   04/05/22 9323  TempSrc: Oral  PainSc: 5       Patients Stated Pain Goal: 8 (55/73/22 0254)  Complications: No notable events documented.

## 2022-04-05 NOTE — Anesthesia Procedure Notes (Signed)
Procedure Name: Intubation Date/Time: 04/05/2022 7:42 AM  Performed by: Verita Lamb, CRNAPre-anesthesia Checklist: Patient identified, Emergency Drugs available, Suction available and Patient being monitored Patient Re-evaluated:Patient Re-evaluated prior to induction Oxygen Delivery Method: Circle system utilized Preoxygenation: Pre-oxygenation with 100% oxygen Induction Type: IV induction Ventilation: Mask ventilation without difficulty Laryngoscope Size: Mac and 4 Grade View: Grade III Tube type: Oral Tube size: 7.0 mm Number of attempts: 1 Airway Equipment and Method: Stylet and Oral airway Placement Confirmation: ETT inserted through vocal cords under direct vision, positive ETCO2, breath sounds checked- equal and bilateral and CO2 detector Secured at: 22 cm Tube secured with: Tape Dental Injury: Teeth and Oropharynx as per pre-operative assessment

## 2022-04-05 NOTE — Anesthesia Preprocedure Evaluation (Addendum)
Anesthesia Evaluation  Patient identified by MRN, date of birth, ID band Patient awake    Reviewed: Allergy & Precautions, NPO status , Patient's Chart, lab work & pertinent test results  Airway Mallampati: II  TM Distance: >3 FB Neck ROM: Full    Dental no notable dental hx.    Pulmonary neg pulmonary ROS   Pulmonary exam normal        Cardiovascular hypertension, Pt. on medications Normal cardiovascular exam     Neuro/Psych  PSYCHIATRIC DISORDERS Anxiety Depression     Neuromuscular disease    GI/Hepatic negative GI ROS, Neg liver ROS,,,  Endo/Other  Hypothyroidism    Renal/GU negative Renal ROS     Musculoskeletal negative musculoskeletal ROS (+)    Abdominal  (+) + obese  Peds  Hematology negative hematology ROS (+)   Anesthesia Other Findings LEFT SHOULDER CARTILAGE DISORDER, OA, IMPINGEMENT SYNDROME, BICEPS TENDINITIS  Reproductive/Obstetrics Hcg negative                             Anesthesia Physical Anesthesia Plan  ASA: 2  Anesthesia Plan: General and Regional   Post-op Pain Management: Regional block*   Induction: Intravenous  PONV Risk Score and Plan: 3 and Ondansetron, Dexamethasone, Midazolam and Treatment may vary due to age or medical condition  Airway Management Planned: Oral ETT  Additional Equipment:   Intra-op Plan:   Post-operative Plan: Extubation in OR  Informed Consent: I have reviewed the patients History and Physical, chart, labs and discussed the procedure including the risks, benefits and alternatives for the proposed anesthesia with the patient or authorized representative who has indicated his/her understanding and acceptance.     Dental advisory given  Plan Discussed with: CRNA  Anesthesia Plan Comments:        Anesthesia Quick Evaluation

## 2022-04-05 NOTE — Interval H&P Note (Signed)
All questions answered, patient wants to proceed with procedure. ? ?

## 2022-04-05 NOTE — Anesthesia Postprocedure Evaluation (Signed)
Anesthesia Post Note  Patient: Karen Duncan  Procedure(s) Performed: SHOULDER ARTHROSCOPY WITH SUBACROMIAL DECOMPRESSION, AND BICEP TENDON REPAIR (Left: Shoulder) SHOULDER ARTHROSCOPY WITH DISTAL CLAVICLE EXCISION (Left: Shoulder)     Patient location during evaluation: PACU Anesthesia Type: Regional and General Level of consciousness: awake Pain management: pain level controlled Vital Signs Assessment: post-procedure vital signs reviewed and stable Respiratory status: spontaneous breathing, nonlabored ventilation and respiratory function stable Cardiovascular status: blood pressure returned to baseline and stable Postop Assessment: no apparent nausea or vomiting Anesthetic complications: no   No notable events documented.  Last Vitals:  Vitals:   04/05/22 0930 04/05/22 0945  BP: 116/87 127/86  Pulse: (!) 103 (!) 108  Resp: 19 15  Temp:  37.1 C  SpO2: 92% 93%    Last Pain:  Vitals:   04/05/22 0945  TempSrc:   PainSc: 0-No pain                 Endre Coutts P Zuriel Yeaman

## 2022-04-05 NOTE — Anesthesia Procedure Notes (Signed)
Anesthesia Regional Block: Interscalene brachial plexus block   Pre-Anesthetic Checklist: , timeout performed,  Correct Patient, Correct Site, Correct Laterality,  Correct Procedure, Correct Position, site marked,  Risks and benefits discussed,  Surgical consent,  Pre-op evaluation,  At surgeon's request and post-op pain management  Laterality: Left  Prep: chloraprep       Needles:  Injection technique: Single-shot  Needle Type: Echogenic Stimulator Needle     Needle Length: 9cm  Needle Gauge: 21     Additional Needles:   Procedures:,,,, ultrasound used (permanent image in chart),,    Narrative:  Start time: 04/05/2022 7:00 AM End time: 04/05/2022 7:10 AM Injection made incrementally with aspirations every 5 mL.  Performed by: Personally  Anesthesiologist: Murvin Natal, MD  Additional Notes: Functioning IV was confirmed and monitors were applied.  A timeout was performed. Sterile prep, hand hygiene and sterile gloves were used. A 36m 21ga Arrow echogenic stimulator needle was used. Negative aspiration and negative test dose prior to incremental administration of local anesthetic. The patient tolerated the procedure well.  Ultrasound guidance: relevent anatomy identified, needle position confirmed, local anesthetic spread visualized around nerve(s), vascular puncture avoided.  Image printed for medical record.

## 2022-04-06 ENCOUNTER — Encounter (HOSPITAL_BASED_OUTPATIENT_CLINIC_OR_DEPARTMENT_OTHER): Payer: Self-pay | Admitting: Orthopaedic Surgery

## 2022-04-10 DIAGNOSIS — M6281 Muscle weakness (generalized): Secondary | ICD-10-CM | POA: Diagnosis not present

## 2022-04-10 DIAGNOSIS — M7542 Impingement syndrome of left shoulder: Secondary | ICD-10-CM | POA: Diagnosis not present

## 2022-04-10 DIAGNOSIS — M25612 Stiffness of left shoulder, not elsewhere classified: Secondary | ICD-10-CM | POA: Diagnosis not present

## 2022-04-12 DIAGNOSIS — M7542 Impingement syndrome of left shoulder: Secondary | ICD-10-CM | POA: Diagnosis not present

## 2022-04-16 DIAGNOSIS — M6281 Muscle weakness (generalized): Secondary | ICD-10-CM | POA: Diagnosis not present

## 2022-04-16 DIAGNOSIS — M25612 Stiffness of left shoulder, not elsewhere classified: Secondary | ICD-10-CM | POA: Diagnosis not present

## 2022-04-16 DIAGNOSIS — M7542 Impingement syndrome of left shoulder: Secondary | ICD-10-CM | POA: Diagnosis not present

## 2022-04-17 ENCOUNTER — Encounter: Payer: Self-pay | Admitting: Family Medicine

## 2022-04-24 DIAGNOSIS — M6281 Muscle weakness (generalized): Secondary | ICD-10-CM | POA: Diagnosis not present

## 2022-04-24 DIAGNOSIS — M25612 Stiffness of left shoulder, not elsewhere classified: Secondary | ICD-10-CM | POA: Diagnosis not present

## 2022-04-24 DIAGNOSIS — M7542 Impingement syndrome of left shoulder: Secondary | ICD-10-CM | POA: Diagnosis not present

## 2022-04-27 ENCOUNTER — Other Ambulatory Visit: Payer: Self-pay | Admitting: Family Medicine

## 2022-04-27 DIAGNOSIS — F32A Depression, unspecified: Secondary | ICD-10-CM

## 2022-05-03 DIAGNOSIS — M7542 Impingement syndrome of left shoulder: Secondary | ICD-10-CM | POA: Diagnosis not present

## 2022-05-03 DIAGNOSIS — M25612 Stiffness of left shoulder, not elsewhere classified: Secondary | ICD-10-CM | POA: Diagnosis not present

## 2022-05-03 DIAGNOSIS — M6281 Muscle weakness (generalized): Secondary | ICD-10-CM | POA: Diagnosis not present

## 2022-05-17 ENCOUNTER — Telehealth: Payer: Self-pay

## 2022-05-17 DIAGNOSIS — Z3009 Encounter for other general counseling and advice on contraception: Secondary | ICD-10-CM

## 2022-05-18 MED ORDER — NORETHINDRONE 0.35 MG PO TABS: 1.0000 | ORAL_TABLET | Freq: Every day | ORAL | 1 refills | Status: DC

## 2022-05-18 NOTE — Telephone Encounter (Signed)
Sorry, I see she was seen by Hensel last year. Med refilled.

## 2022-05-23 DIAGNOSIS — M7542 Impingement syndrome of left shoulder: Secondary | ICD-10-CM | POA: Diagnosis not present

## 2022-05-23 DIAGNOSIS — M25612 Stiffness of left shoulder, not elsewhere classified: Secondary | ICD-10-CM | POA: Diagnosis not present

## 2022-05-23 DIAGNOSIS — M6281 Muscle weakness (generalized): Secondary | ICD-10-CM | POA: Diagnosis not present

## 2022-06-14 DIAGNOSIS — M7542 Impingement syndrome of left shoulder: Secondary | ICD-10-CM | POA: Diagnosis not present

## 2022-06-21 ENCOUNTER — Other Ambulatory Visit: Payer: Self-pay | Admitting: Nurse Practitioner

## 2022-07-09 ENCOUNTER — Other Ambulatory Visit: Payer: Self-pay | Admitting: Family Medicine

## 2022-07-09 DIAGNOSIS — E039 Hypothyroidism, unspecified: Secondary | ICD-10-CM

## 2022-08-03 ENCOUNTER — Telehealth: Payer: BC Managed Care – PPO | Admitting: Family Medicine

## 2022-08-03 DIAGNOSIS — R300 Dysuria: Secondary | ICD-10-CM

## 2022-08-03 DIAGNOSIS — M545 Low back pain, unspecified: Secondary | ICD-10-CM

## 2022-08-03 NOTE — Progress Notes (Signed)
Because back pain and lower belly pain- nausea could be a kidney stone-, I feel your condition warrants further evaluation and I recommend that you be seen in a face to face visit.   NOTE: There will be NO CHARGE for this eVisit

## 2022-08-15 ENCOUNTER — Encounter: Payer: Self-pay | Admitting: Family Medicine

## 2022-08-15 DIAGNOSIS — I10 Essential (primary) hypertension: Secondary | ICD-10-CM

## 2022-08-16 ENCOUNTER — Other Ambulatory Visit: Payer: Self-pay | Admitting: Family Medicine

## 2022-08-16 DIAGNOSIS — I10 Essential (primary) hypertension: Secondary | ICD-10-CM

## 2022-08-16 MED ORDER — LOSARTAN POTASSIUM 50 MG PO TABS: 50.0000 mg | ORAL_TABLET | Freq: Every day | ORAL | 0 refills | Status: DC

## 2022-08-28 ENCOUNTER — Ambulatory Visit (INDEPENDENT_AMBULATORY_CARE_PROVIDER_SITE_OTHER): Payer: BC Managed Care – PPO | Admitting: Family Medicine

## 2022-08-28 ENCOUNTER — Encounter (INDEPENDENT_AMBULATORY_CARE_PROVIDER_SITE_OTHER): Payer: Self-pay | Admitting: Family Medicine

## 2022-08-28 VITALS — BP 142/85 | HR 75 | Temp 98.2°F | Ht 62.0 in | Wt 192.0 lb

## 2022-08-28 DIAGNOSIS — I158 Other secondary hypertension: Secondary | ICD-10-CM

## 2022-08-28 DIAGNOSIS — E669 Obesity, unspecified: Secondary | ICD-10-CM | POA: Diagnosis not present

## 2022-08-28 DIAGNOSIS — Z0289 Encounter for other administrative examinations: Secondary | ICD-10-CM

## 2022-08-28 DIAGNOSIS — Z6835 Body mass index (BMI) 35.0-35.9, adult: Secondary | ICD-10-CM

## 2022-08-28 DIAGNOSIS — F332 Major depressive disorder, recurrent severe without psychotic features: Secondary | ICD-10-CM | POA: Diagnosis not present

## 2022-08-28 DIAGNOSIS — E038 Other specified hypothyroidism: Secondary | ICD-10-CM

## 2022-08-28 NOTE — Progress Notes (Signed)
Karen Grippe, DO, ABFM, ABOM Bariatric physician [address] [place], [place], [place] [address] Office: [phone]  /  Fax: [phone]    Initial Evaluation:  Karen Duncan was seen in clinic today to evaluate for obesity. She is interested in losing weight to improve overall health and reduce the risk of weight related complications. She presents today to review program treatment options, initial physical assessment, and evaluation.      She was referred by: Self-Referral  When asked how has your weight affected you? She states: Has affected self-esteem, Contributed to orthopedic problems or mobility issues, Having fatigue, Having poor endurance, and Has affected mood . This led to our extensive discussion about her thoughts of self-harm. See below for detailed discussion.   Contributing factors to her weight change: Reduced physical activity and Other: Depression    Some associated conditions: Hypertension and Other: Depression, Hypothyroidism, Borderline Prediabetes, and Possible Hyperlipidemia  Current nutrition plan: None  Current level of physical activity: None  Current or previous pharmacotherapy: None  Response to medication: Never tried medications  Past Medical History:  Diagnosis Date   Anxiety    on meds   Depression    on meds   GERD (gastroesophageal reflux disease)    Hypertension    on meds   Hypothyroidism    on meds   Seasonal allergies      Objective:  BP (!) 142/85   Pulse 75   Temp 98.2 F (36.8 C)   Ht 5\' 2"  (1.575 m)   Wt 192 lb (87.1 kg)   SpO2 96%   BMI 35.12 kg/m  She was weighed on the bioimpedance scale: Body mass index is 35.12 kg/m.  Visceral Fat %: 8, Body Fat %: 33.1   Weight Lost Since Last Visit: n/a  Weight Gained Since Last Visit: n/a   Vitals Temp: 98.2 F (36.8 C) BP: (!) 142/85 Pulse Rate: 75 SpO2: 96 %   Anthropometric Measurements Height: 5\' 2"  (1.575 m) Weight: 192 lb (87.1 kg) BMI (Calculated):  35.11 Weight at Last Visit: n/a Weight Lost Since Last Visit: n/a Weight Gained Since Last Visit: n/a   Body Composition  Body Fat %: 33.1 % Fat Mass (lbs): 63.6 lbs Muscle Mass (lbs): 122 lbs Total Body Water (lbs): 75.6 lbs Visceral Fat Rating : 8   Other Clinical Data Fasting: no Labs: no Comments: info session    General: Well Developed, well nourished, and in no acute distress.  HEENT: Normocephalic, atraumatic Skin: Warm and dry, good turgor Chest:  Normal excursion, shape, no gross ABN Respiratory: no conversational dyspnea; speaking in full sentences NeuroM-Sk:  normal gross ROM * 4 extremities  Psych: A and O *3, insight adequate, mood- full but depressed and tearful at times. Pt is maintaining good eye contact, speech spontaneous, and affect does not appear flat or blunted.    Assessment and Plan:   Severe episode of recurrent major depressive disorder, without psychotic features (HCC), with occasional thoughts of self-harm  Assessment:  About two weeks ago, Karen Duncan endorses dealing with acutely increased negative thoughts/ thoughts of self-harm due to her weight and a stressful familial incident. No SI/HI since that episode, no thoughts of self harm currently. Although, she has thought to use a knife to "cut her belly out", she states she would never do so as this would harm her 46 y.o son . She reports that her PCP put in a referral for counseling in the past, however she did not go. She  expresses she should be "strong enough to deal with this" on her own. She is taking Zoloft 100 mg daily, which she has been on for sometime now without any change to her treatment regiment.   Plan: Extensive counseling performed about preconceived notions regarding depression, self-image, and that depression is a disease that warrants the same attention and treatment as any other disease process.   Advised pt to continue with mood medication as prescribed by PCP for the immediate time  being. However, I highly encouraged pt to follow up with her PCP ASAP to discuss her struggles with depression and for a possible adjustment in her mood medication and or counseling referral. She agreed to this. I also strongly recommended her to see a counselor through her EAP program at work in which she can get free counseling. Pt states she will contact them for appointment.   Pt has agreed to either call 911 or the suicide/crisis hotline at 988, which I provided to her, if she has any future SI/HI. A hand written action plan was also provided to the pt today reminding her of the items she will complete in the very near future (I.e contact PCP, contact EAP, etc.)  She made a verbal commitment and handshake with me today and with my medical scribe, Special, as my witness that she will not harm herself and will seek immediate help if those feelings arise in the future prior to seeing her PCP, who treats her depression.    Other secondary hypertension Assessment: Her blood pressure is slightly elevated today, which she thinks is due to nervousness about her visit today. Her hypertension is being treated with Cozaar 50 mg daily. She is tolerating medication well.   Last 3 blood pressure readings in our office are as follows: BP Readings from Last 3 Encounters:  08/28/22 (!) 142/85  04/05/22 127/86  02/15/22 118/76   Plan: Continue with antihypertensive medication as recommended by PCP.   Discussed with patient that following a low salt, heart healthy meal plan and engaging in a regular exercise program can improve her blood pressure. If she decides to join program, we will continue to begin symptoms as they relate to the her weight loss journey.    Other specified hypothyroidism Assessment: Condition is stable and is treated with Synthroid 125 mcg daily. Denies any adverse effects.   Lab Results  Component Value Date   TSH 1.970 02/15/2022   FREET4 1.39 06/11/2017    Plan: Continue with  medication and treatment as recommended by PCP.  Counseling on disease performed. Will check labs in the near future to ensure stability.    Obesity (BMI 30-39.9) Assessment: Condition is not optimized. Fat mass is 63.6 lbs.  Muscle mass is 122 lbs. Total body water is 75.6 lbs. We discussed obesity as a disease and the importance of a more detailed evaluation of all the factors contributing to the disease.  We reviewed weight, associated medical conditions and contributing factors with patient. I anticipate that she would benefit from weight loss therapy via a modified calorie, low-carb, high-protein nutritional plan tailored to their REE (resting energy expenditure) which will be determined by indirect calorimetry at their next office visit.    Plan: DAY VACCHIANO will complete provided nutritional and psychosocial assessment questionnaire before the next appointment.  - She will be scheduled for indirect calorimetry to determine resting energy expenditure in a fasting state.  This will allow [place] to create a reduced calorie, high-protein meal plan to promote  loss of fat mass while preserving muscle mass.  - We will also assess for cardiometabolic risk and nutritional derangements via an ECG and fasting serologies at her next appointment.  - She was encouraged to work on amassing support from family and friends to begin their weight loss journey.   - Work on eliminating or reducing the presence of highly processed, poorly nutritious, calorie-dense foods in the home.   Obesity Education Performed Today:  - Patient was counseled on nutritional approaches to weight loss and benefits of reducing processed foods and consuming plant-based foods and high quality protein as part of nutritional weight management program.   - We discussed the importance of long term lifestyle changes which include nutrition, exercise and behavioral modifications as well as the importance of customizing this to her  specific health and social needs.   - We discussed the benefits of reaching a healthier weight to alleviate the symptoms of existing conditions and reduce the risks of the biomechanical, metabolic and psychological effects of obesity.  - Was counseled on the health benefits of losing 5%-10% of total body weight.  - Was counseled on our cognitive behavorial therapy program, lead by our bariatric psychologist, who focuses on emotional eating and creating positive behavorial change.  - Was counseled on bariatric pharmacotherapy and how this may be used as an adjunct in their weight management     Toniah appears to be in the action stage of change and states they are ready to start intensive  lifestyle modifications and behavioral modifications.  It was recommended that she follow up in the next 1-2 weeks to review the above steps, and to continue with treatment of their chronic disease state of obesity  Attestations:  Reviewed by clinician on day of visit: allergies, medications, problem list, medical history, surgical history, family history, social history, and previous encounter notes pertinent to obesity diagnosis.   70 minutes was spent today on this visit including the above counseling, pre-visit chart review, and post-visit documentation.  Over 50% of this time was spent in direct, face-to-face counseling and coordination of care  I, Special Randolm Idol, acting as a medical scribe for Thomasene Lot, DO., have compiled all relevant documentation for today's office visit on behalf of Thomasene Lot, DO, while in the presence of [employer], DO.  I have reviewed the above documentation for accuracy and completeness, and I agree with the above. Karen Duncan, D.O.  The 21st Century Cures Act was signed into law in 2016 which includes the topic of electronic health records.  This provides immediate access to information in MyChart.  This includes consultation notes, operative notes, office  notes, lab results and pathology reports.  If you have any questions about what you read please let us know at your next visit so we can discuss your concerns and take corrective action if need be.  We are right here with you!

## 2022-09-24 ENCOUNTER — Other Ambulatory Visit: Payer: Self-pay | Admitting: Family Medicine

## 2022-09-24 DIAGNOSIS — E039 Hypothyroidism, unspecified: Secondary | ICD-10-CM

## 2022-09-25 ENCOUNTER — Ambulatory Visit (INDEPENDENT_AMBULATORY_CARE_PROVIDER_SITE_OTHER): Payer: BC Managed Care – PPO | Admitting: Family Medicine

## 2022-10-22 ENCOUNTER — Other Ambulatory Visit: Payer: Self-pay | Admitting: Family Medicine

## 2022-10-22 DIAGNOSIS — I10 Essential (primary) hypertension: Secondary | ICD-10-CM

## 2022-10-30 ENCOUNTER — Encounter (INDEPENDENT_AMBULATORY_CARE_PROVIDER_SITE_OTHER): Payer: Self-pay | Admitting: Family Medicine

## 2022-10-30 ENCOUNTER — Ambulatory Visit (INDEPENDENT_AMBULATORY_CARE_PROVIDER_SITE_OTHER): Payer: BC Managed Care – PPO | Admitting: Family Medicine

## 2022-10-30 VITALS — BP 125/84 | HR 89 | Temp 98.1°F | Ht 62.0 in | Wt 187.0 lb

## 2022-10-30 DIAGNOSIS — E559 Vitamin D deficiency, unspecified: Secondary | ICD-10-CM | POA: Diagnosis not present

## 2022-10-30 DIAGNOSIS — E669 Obesity, unspecified: Secondary | ICD-10-CM

## 2022-10-30 DIAGNOSIS — I158 Other secondary hypertension: Secondary | ICD-10-CM

## 2022-10-30 DIAGNOSIS — E038 Other specified hypothyroidism: Secondary | ICD-10-CM

## 2022-10-30 DIAGNOSIS — Z1331 Encounter for screening for depression: Secondary | ICD-10-CM

## 2022-10-30 DIAGNOSIS — R0602 Shortness of breath: Secondary | ICD-10-CM | POA: Diagnosis not present

## 2022-10-30 DIAGNOSIS — R5383 Other fatigue: Secondary | ICD-10-CM

## 2022-10-30 DIAGNOSIS — F332 Major depressive disorder, recurrent severe without psychotic features: Secondary | ICD-10-CM | POA: Diagnosis not present

## 2022-10-30 DIAGNOSIS — Z6834 Body mass index (BMI) 34.0-34.9, adult: Secondary | ICD-10-CM

## 2022-10-30 NOTE — Telephone Encounter (Signed)
Called patient and scheduled appointment.

## 2022-10-30 NOTE — Progress Notes (Signed)
Carlye Grippe, D.O.  ABFM, ABOM Specializing in Clinical Bariatric Medicine Office located at: [address] [place], [place]  [address]     Bariatric Medicine Visit  Dear Karen Laroche, DO   Thank you for referring Karen Duncan to our clinic today for evaluation.  We performed a consultation to discuss her options for treatment and educate the patient on her disease state.  The following note includes my evaluation and treatment recommendations.   Please do not hesitate to reach out to me directly if you have any further concerns.   Assessment and Plan:   Orders Placed This Encounter  Procedures   CBC with Differential/Platelet   VITAMIN D 25 Hydroxy (Vit-D Deficiency, Fractures)   Comprehensive metabolic panel   Folate   Hemoglobin A1c   Insulin, random   Lipid Panel With LDL/HDL Ratio   T4, free   TSH   Vitamin B12   EKG 12-Lead    Medications Discontinued During This Encounter  Medication Reason   albuterol (VENTOLIN HFA) 108 (90 Base) MCG/ACT inhaler      Fatigue Assessment & Plan: Karen Duncan does feel that her weight is causing her energy to be lower than it should be. Fatigue may be related to obesity, depression or many other causes. she does not appear to have any red flag symptoms and this appears to most likely be related to her current lifestyle habits and dietary intake.  Labs will be ordered and reviewed with her at their next office visit in two weeks.  Epworth sleepiness scale score does not appear to be within normal limits. Her ESS score is 11. Karen Duncan reports daytime somnolence and reports waking up still tired. Karen Duncan generally gets 6 hours of sleep per night, and states that she has generally unrestful sleep. Snoring is present. Apneic episodes is present.   Pt reports being referred for a sleep study by Dr.Hensel, however she was never called about it. Pt has been instructed to contact her new PCP and inquire about the past sleep study  referall.   ECG: Performed and reviewed/ interpreted independently.  Normal sinus rhythm, rate 78 bpm; reassuring without any acute abnormalities, will continue to monitor for symptoms   Depression screen [Z13.31]/Modified PHQ-9 Depression Screen: Her Food and Mood (modified PHQ-9) score was 18. Pt's emotions are much improved from last visit. She has no SI or any plans/thoughts of self harm.    In the meanwhile, Karen Duncan will focus on self care including making healthy food choices by following their meal plan, improving sleep quality and focusing on stress reduction.  Once we are assured she is on an appropriate meal plan, we will start discussing exercise to increase cardiovascular fitness levels.    Shortness of breath on exertion Assessment & Plan: Karen Duncan does feel that she gets out of breath more easily than she used to when she exercises and seems to be worsening over time with weight gain.  This has gotten worse recently. Karen Duncan denies shortness of breath at rest or orthopnea. Karen Duncan's shortness of breath appears to be obesity related and exercise induced, as they do not appear to have any "red flag" symptoms/ concerns today.  Also, this condition appears to be related to a state of poor cardiovascular conditioning.   Obtain labs today and will be reviewed with her at their next office visit in two weeks.  Indirect Calorimeter completed today to help guide our dietary regimen. It shows a VO2 of 230 and a  REE of 1584. Her calculated basal metabolic rate is 0272 thus her measured basal metabolic rate is  slightly better  than expected.  Patient agreed to work on weight loss at this time.  As Karen Duncan progresses through our weight loss program, we will gradually increase exercise as tolerated to treat her current condition.   If Karen Duncan follows our recommendations and loses 5-10% of their weight without improvement of her shortness of breath or if at any time, symptoms become more concerning, they agree  to urgently follow up with their PCP/ specialist for further consideration/ evaluation.   Karen Duncan verbalizes agreement with this plan.    Other secondary hypertension Assessment & Plan: Hypertension treated with Cozaar 50 mg daily. Blood pressure stable. No concerns in this regard today.   Last 3 blood pressure readings in our office are as follows: BP Readings from Last 3 Encounters:  10/30/22 125/84  08/28/22 (!) 142/85  04/05/22 127/86   C/w antihypertensive medication per Dr.Rumball. Begin with Prudent nutritional plan and low sodium diet, advance exercise as tolerated.   We will begin to monitor closely alongside PCP/ specialists.     Severe episode of recurrent major depressive disorder, without psychotic features (HCC), with occasional thoughts of self-harm - emotional eating Assessment & Plan: Denies any thoughts of self-harm since office visit on 08/28/22. She now sees a counselor once a week through her EAP. Feels that her mood has improved considerably, but notes she could benefit from additional counseling. Furthermore, pt endorses sometimes eating when bored or stressed. Overall, she feels that emotional eating is not a persistent issue. No issues with Zoloft 100 mg daily, compliance good.   Continue with Zoloft per Dr.Eniola. Encouraged pt to explore additional counseling through her insurance. Reminded her to either call 911 or the suicide/crisis hotline at 988 if she has any reoccurring self-harm thoughts. In the future pt may meet Dr.Barker (our bariatric psychologist) - she declines a referral today. Will begin to monitor condition. Pt reminded to f/up with PCP.    Other specified hypothyroidism Assessment & Plan: Hypothyroidism treated with Synthroid 125 mcg daily.   Lab Results  Component Value Date   TSH 1.970 02/15/2022   FREET4 1.39 06/11/2017   Check labs today. C/w Synthroid per Dr.Rumball. Will begin to monitor condition.    TREATMENT PLAN FOR OBESITY: BMI  34.0-34.9,adult - current 34.19 Obesity (BMI 30.0-34.9) starting BMI 10/30/22 34.19 Assessment & Plan: Muscle mass is 106 lbs. Fat mass is 75.8 lbs.Total body water is  71.2 lbs.   Birtie will work on healthier eating habits and try their best to follow the Category 2 meal plan.  Behavioral Intervention Additional resources provided today: category 2 meal plan information and Healthy Eating Principles Handout Evidence-based interventions for health behavior change were utilized today including the discussion of self monitoring techniques, problem-solving barriers and SMART goal setting techniques.   Regarding patient's less desirable eating habits and patterns, we employed the technique of small changes.  Pt will specifically work on: beginning prescribed meal plan for next visit.    FOLLOW UP: Follow up in 3 weeks. She was informed of the importance of frequent follow up visits to maximize her success with intensive lifestyle modifications for her multiple health conditions.  Sheena Percifield Vonnahme is aware that we will review all of her lab results at our next visit.  She is aware that if anything is critical/ life threatening with the results, we will be contacting her via MyChart prior to the office  visit to discuss management.    Chief Complaint:   OBESITY Lilliana LYNIAH VALAZQUEZ (MR# [id number]) is a 47 y.o. female who presents for evaluation and treatment of obesity and related comorbidities. Current BMI is Body mass index is 34.2 kg/m. Ikran has been struggling with her weight for many years and has been unsuccessful in either losing weight, maintaining weight loss, or reaching her healthy weight goal.  LITTIE HASSETT is currently in the action stage of change and ready to dedicate time achieving and maintaining a healthier weight. Suriya is interested in becoming our patient and working on intensive lifestyle modifications including (but not limited to) diet and exercise for weight loss.  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 Patient is married to [employer]  and has a 22 y.o son. She lives with her husband and son.  - Desired weight is 135 lbs at some point.   - Reasons for weight gain: stress, pregnancy, and depression.   - Does not engage in any formal exercise. Walks 7,000 steps daily on average.   - Has tried the low carb diet plan in the past - worked best for her because she lost the most weight.  - Eats outside the home 2 x a wk and has fast food or take out once a wk.   - Obstacles to cooking: "lack of energy"   - She craves pasta, rice, & greens late in the evenings.   - Food dislikes: shellfish  - Snacks on yogurt, nuts, and peanut butter.   - Skips breakfast on most days.  - Drinks coffee with sweeteners, unsweetened tea, fruit & veggie smoothies, wine & beer (one drink a week).   - Worst food habit: "possibly portion control".  Subjective:   This is the patient's first visit at [facility].  The patient's NEW PATIENT PACKET that they filled out prior to today's office visit was reviewed at length and information from that paperwork was included within the following office visit note.    Included in the packet: current and past health history, medications, allergies, ROS, gynecologic history (women only), surgical history, family history, social history, weight history, weight loss surgery history (for those that have had weight loss surgery), nutritional evaluation, mood and food questionnaire along with a depression screening (PHQ9) on all patients, an Epworth questionnaire, sleep habits questionnaire, patient life and health improvement goals questionnaire. These will all be scanned into the patient's chart under the "media" tab.   Review of Systems: Please refer to new patient packet scanned into media. Pertinent positives were addressed with patient today.  Reviewed by clinician on day of visit: allergies,  medications, problem list, medical history, surgical history, family history, social history, and previous encounter notes.  During the visit, I independently reviewed the patient's EKG, bioimpedance scale results, and indirect calorimeter results. I used this information to tailor a meal plan for the patient that will help JEDIDAH ALBACH to lose weight and will improve her obesity-related conditions going forward.  I performed a medically necessary appropriate examination and/or evaluation. I discussed the assessment and treatment plan with the patient. The patient was provided an opportunity to ask questions and all were answered. The patient agreed with the plan and demonstrated an understanding of the instructions. Labs were ordered today (unless patient declined them) and will be reviewed with the patient at our next visit unless more critical results need to be addressed immediately. Clinical information  was updated and documented in the EMR.   Objective:   PHYSICAL EXAM: Blood pressure 125/84, pulse 89, temperature 98.1 F (36.7 C), height 5\' 2"  (1.575 m), weight 187 lb (84.8 kg), SpO2 94%. Body mass index is 34.2 kg/m. General: Well Developed, well nourished, and in no acute distress.  HEENT: Normocephalic, atraumatic Skin: Warm and dry, cap RF less 2 sec, good turgor Chest:  Normal excursion, shape, no gross abn Respiratory: speaking in full sentences, no conversational dyspnea NeuroM-Sk: Ambulates w/o assistance, moves * 4 Psych: A and O *3, insight good, mood-full  Anthropometric Measurements Height: 5\' 2"  (1.575 m) Weight: 187 lb (84.8 kg) BMI (Calculated): 34.19 Weight at Last Visit: na Weight Lost Since Last Visit: na Weight Gained Since Last Visit: na Starting Weight: 187lb Peak Weight: 192lb Waist Measurement : 42 inches   Body Composition  Body Fat %: 40.5 % Fat Mass (lbs): 75.8 lbs Muscle Mass (lbs): 106 lbs Total Body Water (lbs): 71.2 lbs Visceral Fat Rating :  10   Other Clinical Data RMR: [id number] Fasting: yes Labs: yes Today's Visit #: 1 Starting Date: 10/30/22 Comments: first visit    DIAGNOSTIC DATA REVIEWED:  BMET    Component Value Date/Time   NA 134 02/15/2022 1106   K 4.1 02/15/2022 1106   CL 98 02/15/2022 1106   CO2 21 02/15/2022 1106   GLUCOSE 77 02/15/2022 1106   GLUCOSE 99 11/18/2015 1051   GLUCOSE 76 04/02/2013 1241   BUN 10 02/15/2022 1106   CREATININE 0.66 02/15/2022 1106   CREATININE 0.83 11/18/2015 1051   CALCIUM 9.5 02/15/2022 1106   GFRNONAA 95 12/10/2019 1111   GFRNONAA 89 11/18/2015 1051   GFRAA 109 12/10/2019 1111   GFRAA >89 11/18/2015 1051   Lab Results  Component Value Date   HGBA1C 5.8 (H) 02/15/2022   HGBA1C 5.6 09/17/2018   No results found for: "INSULIN" Lab Results  Component Value Date   TSH 1.970 02/15/2022   CBC    Component Value Date/Time   WBC 6.8 04/03/2021 1611   WBC 6.6 06/07/2013 0610   RBC 4.28 04/03/2021 1611   RBC 4.37 06/07/2013 0610   HGB 12.9 04/03/2021 1611   HGB 12.1 10/23/2012 0000   HCT 37.3 04/03/2021 1611   HCT 36 10/23/2012 0000   PLT 314 04/03/2021 1611   PLT 336 10/23/2012 0000   MCV 87 04/03/2021 1611   MCH 30.1 04/03/2021 1611   MCH 30.4 06/07/2013 0610   MCHC 34.6 04/03/2021 1611   MCHC 34.2 06/07/2013 0610   RDW 12.0 04/03/2021 1611   Iron Studies No results found for: "IRON", "TIBC", "FERRITIN", "IRONPCTSAT" Lipid Panel     Component Value Date/Time   CHOL 177 02/15/2022 1106   TRIG 120 02/15/2022 1106   HDL 54 02/15/2022 1106   CHOLHDL 3.3 02/15/2022 1106   LDLCALC 102 (H) 02/15/2022 1106   Hepatic Function Panel     Component Value Date/Time   PROT 7.1 02/15/2022 1106   ALBUMIN 4.3 02/15/2022 1106   AST 18 02/15/2022 1106   ALT 19 02/15/2022 1106   ALKPHOS 51 02/15/2022 1106   BILITOT 0.5 02/15/2022 1106      Component Value Date/Time   TSH 1.970 02/15/2022 1106   Nutritional No results found for: "VD25OH"  Attestation  Statements:   I, Special Puri, acting as a Stage manager for [employer], DO., have compiled all relevant documentation for today's office visit on behalf of Thomasene Lot, DO, while in the presence  of [employer], DO.  Time spent on visit including pre-visit chart review and post-visit care was estimated to be 65 minutes. Over 50% of the time was spent in direct face to face counseling and coordination of care.  I have reviewed the above documentation for accuracy and completeness, and I agree with the above. Carlye Grippe, D.O.  The 21st Century Cures Act was signed into law in 2016 which includes the topic of electronic health records.  This provides immediate access to information in MyChart.  This includes consultation notes, operative notes, office notes, lab results and pathology reports.  If you have any questions about what you read please let us know at your next visit so we can discuss your concerns and take corrective action if need be.  We are right here with you.

## 2022-10-31 LAB — VITAMIN D 25 HYDROXY (VIT D DEFICIENCY, FRACTURES): Vit D, 25-Hydroxy: 25.3 ng/mL — ABNORMAL LOW (ref 30.0–100.0)

## 2022-10-31 LAB — COMPREHENSIVE METABOLIC PANEL
ALT: 31 IU/L (ref 0–32)
AST: 25 IU/L (ref 0–40)
Albumin: 4.6 g/dL (ref 3.9–4.9)
Alkaline Phosphatase: 66 IU/L (ref 44–121)
BUN/Creatinine Ratio: 15 (ref 9–23)
BUN: 12 mg/dL (ref 6–24)
Bilirubin Total: 0.3 mg/dL (ref 0.0–1.2)
CO2: 22 mmol/L (ref 20–29)
Calcium: 10.3 mg/dL — ABNORMAL HIGH (ref 8.7–10.2)
Chloride: 99 mmol/L (ref 96–106)
Creatinine, Ser: 0.79 mg/dL (ref 0.57–1.00)
Globulin, Total: 3.2 g/dL (ref 1.5–4.5)
Glucose: 99 mg/dL (ref 70–99)
Potassium: 4.3 mmol/L (ref 3.5–5.2)
Sodium: 136 mmol/L (ref 134–144)
Total Protein: 7.8 g/dL (ref 6.0–8.5)
eGFR: 93 mL/min/{1.73_m2} (ref >59)

## 2022-10-31 LAB — CBC WITH DIFFERENTIAL/PLATELET
Basophils Absolute: 0.1 10*3/uL (ref 0.0–0.2)
Basos: 1 %
EOS (ABSOLUTE): 0.1 10*3/uL (ref 0.0–0.4)
Eos: 1 %
Hematocrit: 40.6 % (ref 34.0–46.6)
Hemoglobin: 13.6 g/dL (ref 11.1–15.9)
Immature Grans (Abs): 0 10*3/uL (ref 0.0–0.1)
Immature Granulocytes: 0 %
Lymphocytes Absolute: 2 10*3/uL (ref 0.7–3.1)
Lymphs: 33 %
MCH: 30 pg (ref 26.6–33.0)
MCHC: 33.5 g/dL (ref 31.5–35.7)
MCV: 90 fL (ref 79–97)
Monocytes Absolute: 0.5 10*3/uL (ref 0.1–0.9)
Monocytes: 9 %
Neutrophils Absolute: 3.3 10*3/uL (ref 1.4–7.0)
Neutrophils: 56 %
Platelets: 343 10*3/uL (ref 150–450)
RBC: 4.53 x10E6/uL (ref 3.77–5.28)
RDW: 11.7 % (ref 11.7–15.4)
WBC: 5.9 10*3/uL (ref 3.4–10.8)

## 2022-10-31 LAB — LIPID PANEL WITH LDL/HDL RATIO
Cholesterol, Total: 174 mg/dL (ref 100–199)
HDL: 35 mg/dL — ABNORMAL LOW (ref >39)
LDL Chol Calc (NIH): 122 mg/dL — ABNORMAL HIGH (ref 0–99)
LDL/HDL Ratio: 3.5 ratio — ABNORMAL HIGH (ref 0.0–3.2)
Triglycerides: 89 mg/dL (ref 0–149)
VLDL Cholesterol Cal: 17 mg/dL (ref 5–40)

## 2022-10-31 LAB — HEMOGLOBIN A1C
Est. average glucose Bld gHb Est-mCnc: 126 mg/dL
Hgb A1c MFr Bld: 6 % — ABNORMAL HIGH (ref 4.8–5.6)

## 2022-10-31 LAB — INSULIN, RANDOM: INSULIN: 34.1 u[IU]/mL — ABNORMAL HIGH (ref 2.6–24.9)

## 2022-10-31 LAB — VITAMIN B12: Vitamin B-12: 671 pg/mL (ref 232–1245)

## 2022-10-31 LAB — FOLATE: Folate: 7.2 ng/mL (ref >3.0)

## 2022-10-31 LAB — TSH: TSH: 0.964 u[IU]/mL (ref 0.450–4.500)

## 2022-10-31 LAB — T4, FREE: Free T4: 1.33 ng/dL (ref 0.82–1.77)

## 2022-11-17 ENCOUNTER — Other Ambulatory Visit: Payer: Self-pay | Admitting: Family Medicine

## 2022-11-17 DIAGNOSIS — Z3009 Encounter for other general counseling and advice on contraception: Secondary | ICD-10-CM

## 2022-11-19 ENCOUNTER — Ambulatory Visit (INDEPENDENT_AMBULATORY_CARE_PROVIDER_SITE_OTHER): Payer: BC Managed Care – PPO | Admitting: Family Medicine

## 2022-11-19 ENCOUNTER — Encounter: Payer: Self-pay | Admitting: Family Medicine

## 2022-11-19 VITALS — BP 140/90 | HR 79 | Ht 62.0 in | Wt 185.0 lb

## 2022-11-19 DIAGNOSIS — I158 Other secondary hypertension: Secondary | ICD-10-CM | POA: Diagnosis not present

## 2022-11-19 DIAGNOSIS — F32A Depression, unspecified: Secondary | ICD-10-CM

## 2022-11-19 DIAGNOSIS — E669 Obesity, unspecified: Secondary | ICD-10-CM

## 2022-11-19 DIAGNOSIS — E038 Other specified hypothyroidism: Secondary | ICD-10-CM

## 2022-11-19 DIAGNOSIS — R0683 Snoring: Secondary | ICD-10-CM

## 2022-11-19 DIAGNOSIS — Z23 Encounter for immunization: Secondary | ICD-10-CM | POA: Diagnosis not present

## 2022-11-19 DIAGNOSIS — R7303 Prediabetes: Secondary | ICD-10-CM

## 2022-11-19 DIAGNOSIS — Z3009 Encounter for other general counseling and advice on contraception: Secondary | ICD-10-CM

## 2022-11-19 NOTE — Patient Instructions (Addendum)
It was great to see you!  Our plans for today:  - We are getting a sleep study. Someone will call you with this appointment information. - Check your blood pressure regularly at home. Make sure you are seated, comfortable and relaxed for at least 5 minutes prior to checking.  - Come back in about 2 months for recheck.   We are checking some labs today, we will release these results to your MyChart.  Take care and seek immediate care sooner if you develop any concerns.   Dr. Myrlene Broker on your back. Bend your right knee so that your right foot is flat on the floor. Cross your left leg over your right so that your left ankle rests on your right knee. Use your hands to grab hold of your left knee and pull it gently toward the opposite shoulder. You should feel the stretch in your buttocks and hips. Hold for 15 to 30 seconds. Relax, and then repeat with the other leg. Repeat this cycle 2 to 4 times.   Lie on your back in a doorway, with one leg through the open door. Slide your leg up the wall to straighten your knee. You should feel a gentle stretch down the back of your leg. Hold the stretch for at least 1 minute. As the days go by, add a little more time until you can relax and let these muscles stretch for as much as 6 minutes for each leg. Do not arch your back. Do not bend either knee. Keep one heel touching the floor and the other heel touching the wall. Do not point your toes. Repeat with your other leg. Do 2 to 4 times for each leg. If you do not have a place to do this exercise in a doorway, there is another way to do it:  Lie on your back and bend the knee of the leg you want to stretch. Loop a towel under the ball and toes of that foot, and hold the ends of the towel in your hands. Straighten your knee and slowly pull back on the towel. You should feel a gentle stretch down the back of your leg. It is hard to hold this stretch with a towel for a long time, but hold the  stretch for at least 15 to 30 seconds. One minute or more is even better. Repeat with your other leg. Do 2 to 4 times for each leg.   Child's Pose Kneel on the floor with your toes together and your knees hip-width apart. Rest your palms on top of your thighs. On an exhale, lower your torso between your knees. Extend your arms alongside your torso with your palms facing down. Relax your shoulders toward the ground. Rest in the pose for as long as needed.

## 2022-11-19 NOTE — Progress Notes (Unsigned)
    SUBJECTIVE:   CHIEF COMPLAINT / HPI:   Hypertension: - Medications: losartan - Compliance: good - Checking BP at home: sometimes, 130-140s SBP/80 - snores at night with some apneic episodes per husband.  Hypothyroidism - Medications: Synthroid - Current symptoms:  none - Denies {$Symptoms; thyroid:[phone]} - Symptoms have {$Desc; symptom progression:[phone]} - ***last TSH wnl.  Depression - on zoloft. Doing well now. Worse a few months ago.  Contraception - on POPs. Every month.   Prediabetes - A1c 6.0. seeing healthy weight.   Leg pain - R sided low back pain. Back of R leg. Taking aleve, helps. Sitting makes worse. Few months. Aching, throbbing. No B/B loss. No fevers.    OBJECTIVE:   BP (!) 142/77   Pulse 79   Ht 5\' 2"  (1.575 m)   Wt 185 lb (83.9 kg)   LMP 10/15/2022   SpO2 98%   BMI 33.84 kg/m   ***  ASSESSMENT/PLAN:   No problem-specific Assessment & Plan notes found for this encounter.     [name], DO

## 2022-11-20 ENCOUNTER — Ambulatory Visit (INDEPENDENT_AMBULATORY_CARE_PROVIDER_SITE_OTHER): Payer: BC Managed Care – PPO | Admitting: Family Medicine

## 2022-11-20 DIAGNOSIS — E669 Obesity, unspecified: Secondary | ICD-10-CM | POA: Insufficient documentation

## 2022-11-20 NOTE — Assessment & Plan Note (Addendum)
Continue weight loss efforts. Would likely benefit from medical weight loss given comorbidities HTN, new prediabetes, and likely OSA. Continue to follow with [facility], consider initiating GLP-1 agonist or metformin at follow up

## 2022-11-20 NOTE — Assessment & Plan Note (Signed)
Continues on POPs. With regular periods, still needing contraception. Discussed natural progression to menopause and clinical diagnosis.

## 2022-11-20 NOTE — Assessment & Plan Note (Signed)
Recent A1c now with prediabetes. Continue efforts with weight loss as below.

## 2022-11-20 NOTE — Assessment & Plan Note (Signed)
Discussed PHQ score, elevated today though reports some improvement over the last few months. Declines med changes today and wants to work on weight loss as suspects this will help, reasonable. F/u 2 months.

## 2022-11-20 NOTE — Assessment & Plan Note (Signed)
Recent labs wnl. No changes

## 2022-11-20 NOTE — Assessment & Plan Note (Signed)
Elevated today and on recheck but with reasonable measurements at home. No med changes today. Keep home BP log and f/u in 2 months. Suspect will improve as she is able to lose weight.

## 2022-11-20 NOTE — Assessment & Plan Note (Signed)
Get sleep study

## 2022-11-26 ENCOUNTER — Encounter: Payer: Self-pay | Admitting: Family Medicine

## 2022-11-28 ENCOUNTER — Other Ambulatory Visit: Payer: Self-pay | Admitting: Family Medicine

## 2022-11-28 DIAGNOSIS — R0683 Snoring: Secondary | ICD-10-CM

## 2022-12-04 ENCOUNTER — Ambulatory Visit (INDEPENDENT_AMBULATORY_CARE_PROVIDER_SITE_OTHER): Payer: BC Managed Care – PPO | Admitting: Family Medicine

## 2022-12-04 ENCOUNTER — Encounter (INDEPENDENT_AMBULATORY_CARE_PROVIDER_SITE_OTHER): Payer: Self-pay | Admitting: Family Medicine

## 2022-12-04 VITALS — BP 125/81 | HR 69 | Temp 98.0°F | Ht 62.0 in | Wt 180.0 lb

## 2022-12-04 DIAGNOSIS — F332 Major depressive disorder, recurrent severe without psychotic features: Secondary | ICD-10-CM

## 2022-12-04 DIAGNOSIS — E559 Vitamin D deficiency, unspecified: Secondary | ICD-10-CM

## 2022-12-04 DIAGNOSIS — R7303 Prediabetes: Secondary | ICD-10-CM

## 2022-12-04 DIAGNOSIS — E7849 Other hyperlipidemia: Secondary | ICD-10-CM | POA: Diagnosis not present

## 2022-12-04 DIAGNOSIS — Z6834 Body mass index (BMI) 34.0-34.9, adult: Secondary | ICD-10-CM

## 2022-12-04 DIAGNOSIS — E038 Other specified hypothyroidism: Secondary | ICD-10-CM

## 2022-12-04 DIAGNOSIS — E669 Obesity, unspecified: Secondary | ICD-10-CM

## 2022-12-04 MED ORDER — VITAMIN D (ERGOCALCIFEROL) 1.25 MG (50000 UNIT) PO CAPS: 50000.0000 [IU] | ORAL_CAPSULE | ORAL | 0 refills | Status: DC

## 2022-12-04 NOTE — Progress Notes (Signed)
Karen Duncan, D.O.  ABFM, ABOM Clinical Bariatric Medicine Physician  Office located at: [address] [place], [place]  [address]     Assessment and Plan:   Meds ordered this encounter  Medications   Vitamin D, Ergocalciferol, (DRISDOL) 1.25 MG (50000 UNIT) CAPS capsule    Sig: Take 1 capsule (50,000 Units total) by mouth every 7 (seven) days.    Dispense:  4 capsule    Refill:  0     Prediabetes - new onset Assessment & Plan: Lab Results  Component Value Date   HGBA1C 6.0 (H) 10/30/2022   HGBA1C 5.8 (H) 02/15/2022   HGBA1C 5.5 04/03/2021   INSULIN 34.1 (H) 10/30/2022   Lab Results  Component Value Date   CREATININE 0.79 10/30/2022   BUN 12 10/30/2022   NA 136 10/30/2022   K 4.3 10/30/2022   CL 99 10/30/2022   CO2 22 10/30/2022      Component Value Date/Time   PROT 7.8 10/30/2022 1001   ALBUMIN 4.6 10/30/2022 1001   AST 25 10/30/2022 1001   ALT 31 10/30/2022 1001   ALKPHOS 66 10/30/2022 1001   BILITOT 0.3 10/30/2022 1001    Lab Results  Component Value Date   WBC 5.9 10/30/2022   HGB 13.6 10/30/2022   HCT 40.6 10/30/2022   MCV 90 10/30/2022   PLT 343 10/30/2022   Lab Results  Component Value Date   VITAMINB12 671 10/30/2022   FOLATE 7.2 10/30/2022    No current meds. Diet/exercise approach. A1c has increased from 5.8 to 6.0. Fasting insulin is roughly 7 times normal. Hunger is controlled. Has carb cravings around her menstrual cycle and in general sometimes craves carbs like pasta. Kidney function, electrolytes, and liver enzymes within recommended limits. Blood counts are within normal limits and not indicative of anemia. No concerns with B12 and folate levels.   I counseled patient on pathophysiology of the disease process of Pre-DM.   Explained role of simple carbs and insulin levels on hunger and cravings In addition, we discussed Metformin which can help [place] in the management of this disease process as well as with weight loss.  Will  consider initiating Metformin in future; pt wishes to hold off for now. Karen Duncan will continue to work on weight loss, exercise, via their meal plan we devised to help decrease the risk of progressing to diabetes.    Other specified hypothyroidism Assessment & Plan: Lab Results  Component Value Date   TSH 0.964 10/30/2022   FREET4 1.33 10/30/2022    Condition treated with Synthroid 125 mcg daily. TSH and FREE T4 are within normal limits. C/w Synthroid per PCP. Will continue to monitor alongside PCP.    Other hyperlipidemia  Assessment & Plan: Lab Results  Component Value Date   CHOL 174 10/30/2022   HDL 35 (L) 10/30/2022   LDLCALC 122 (H) 10/30/2022   TRIG 89 10/30/2022   CHOLHDL 3.3 02/15/2022   The 10-year ASCVD risk score (Arnett DK, et al., 2019) is: 1.8%   Values used to calculate the score:     Age: 47 years     Sex: Female     Is Non-Hispanic African American: No     Diabetic: No     Tobacco smoker: No     Systolic Blood Pressure: 125 mmHg     Is BP treated: Yes     HDL Cholesterol: 35 mg/dL     Total Cholesterol: 174 mg/dL  LDL is elevated at  122. HDL is low at 35. In the past, pt had elevated TRIG of 213 - levels are stable now. Based on her ASCVD risk score , I do not feel that statin therapy is warranted at this time.   I stressed the importance that patient continue with our prudent nutritional plan that is low in saturated and trans fats, and low in fatty carbs to improve these numbers. We recommend: aerobic activity with eventual goal of a minimum of 150+ min wk plus 2 days/ week of resistance or strength training. We extensively discussed several lifestyle modifications today and she will continue to work on diet, exercise and weight loss efforts.   Vitamin D deficiency - new onset Assessment & Plan: Lab Results  Component Value Date   VD25OH 25.3 (L) 10/30/2022   Vitamin D levels are sub-optimal and not within the optimal range of 50 to 70-80. I discussed  the importance of vitamin D to the patient's health and well-being as well as to their ability to lose weight. I reviewed possible symptoms of low Vitamin D:  low energy, depressed mood, muscle aches, joint aches, osteoporosis etc. with patient. It has been show that administration of vitamin D supplementation leads to improved satiety and a decrease in inflammatory markers.  Hence, low Vitamin D levels may be linked to an increased risk of cardiovascular events and even increased risk of cancers- such as colon and breast. Pt has been instructed to begin ERGO 50,000 units once wkly.    Severe episode of recurrent major depressive disorder, without psychotic features (HCC), with occasional thoughts of self-harm - emotional eating Assessment & Plan: Current med: Zoloft 100 mg daily. Denies any thoughts of self-harm since office visit on 08/28/22. Mood is stable. No longer seeing counselor through [facility]. Mental health resource handout and list of counselors provided to pt. C/w Zoloft per Dr.Eniola. Reminded pt of the importance of following their prudent nutrition plan and how food can affect mood as well to support emotional wellbeing.    BMI 34.0-34.9,adult - current 34.19 Obesity (BMI 30-39.9) Assessment & Plan: Since last office visit patient's  Muscle mass has decreased by 2.6 lb. Fat mass has decreased by 4 lb. Total body water has not changed. Counseling done on how various foods will affect these numbers and how to maximize success  Total lbs lost to date: 7 lbs  Total weight loss percentage to date: 3.74%  Meal Plan: Journaling 1100-1200 calories & 85+ grams protein with Cat 2 meal plan with lunch options as a guide.   Behavioral Intervention Additional resources provided today: Journaling handout, Lunch options, Mental Health Resources Handout, Prediabetes Handout, Metformin Handout   Evidence-based interventions for health behavior change were utilized today including the discussion of self  monitoring techniques, problem-solving barriers and SMART goal setting techniques.   Regarding patient's less desirable eating habits and patterns, we employed the technique of small changes.  Pt will specifically work on: journaling intake/bringing in food log & increasing walking to 30 minutes, 3 days a wk for next visit.   FOLLOW UP: Return in about 20 days (around 12/24/2022). She was informed of the importance of frequent follow up visits to maximize her success with intensive lifestyle modifications for her multiple health conditions.  Subjective:   Chief complaint: Obesity Karen Duncan is here to discuss her progress with her obesity treatment plan. She is on the Category 2 Plan + Journaling (pt enjoys journaling her intake) and states she is following her eating plan approximately 90%  of the time. She states she is walking 30 minutes 2 days per week.  Interval History:  Karen Duncan is here today for her first follow-up office visit since starting the program with [place]. Since last office visit Karen Duncan has been doing well. Reports measuring & eating all the proteins on her meal plan. Food recall: for breakfast, she typically has a cheese stick, 1 hard boiled egg, a slice of keto bread, and a laughing cow cheese. Lunch: greek salad (cucumber, tomato, & 8 ounces of some protein e.g [place] meatballs). Dinner: 4-6 ounces of protein (e.g lean ham). Snacks on a piece of fruit after dinner. Drinks a 25 cal green matcha tea & adequate amounts of water. Pt likes to journal on my fitness pal and averages 1,000-1,100 calories daily. Went to PCP & was referred for sleep study.   All blood work/ lab tests that were recently ordered by myself or an outside provider were reviewed with patient today per their request. Extended time was spent counseling her on all new disease processes that were discovered or preexisting ones that are affected by BMI.  she understands that many of these abnormalities will need to  monitored regularly along with the current treatment plan of prudent dietary changes, in which we are making each and every office visit, to improve these health parameters.  Review of Systems:  Pertinent positives were addressed with patient today.  Reviewed by clinician on day of visit: allergies, medications, problem list, medical history, surgical history, family history, social history, and previous encounter notes.  Weight Summary and Biometrics   Weight Lost Since Last Visit: 7lb  Weight Gained Since Last Visit: 0lb   Vitals Temp: 98 F (36.7 C) BP: 125/81 Pulse Rate: 69 SpO2: 99 %   Anthropometric Measurements Height: 5\' 2"  (1.575 m) Weight: 180 lb (81.6 kg) BMI (Calculated): 32.91 Weight at Last Visit: 187lb Weight Lost Since Last Visit: 7lb Weight Gained Since Last Visit: 0lb Starting Weight: 187lb Total Weight Loss (lbs): 7 lb (3.175 kg) Peak Weight: 192lb   Body Composition  Body Fat %: 39.7 % Fat Mass (lbs): 71.8 lbs Muscle Mass (lbs): 103.4 lbs Total Body Water (lbs): 71.2 lbs Visceral Fat Rating : 9   Other Clinical Data Fasting: no Labs: no Today's Visit #: 2 Starting Date: 10/30/22   Objective:   PHYSICAL EXAM:  Blood pressure 125/81, pulse 69, temperature 98 F (36.7 C), height 5\' 2"  (1.575 m), weight 180 lb (81.6 kg), last menstrual period 10/15/2022, SpO2 99%. Body mass index is 32.92 kg/m.  General: Well Developed, well nourished, and in no acute distress.  HEENT: Normocephalic, atraumatic Skin: Warm and dry, cap RF less 2 sec, good turgor Chest:  Normal excursion, shape, no gross abn Respiratory: speaking in full sentences, no conversational dyspnea NeuroM-Sk: Ambulates w/o assistance, moves * 4 Psych: A and O *3, insight good, mood-full  DIAGNOSTIC DATA REVIEWED:  BMET    Component Value Date/Time   NA 136 10/30/2022 1001   K 4.3 10/30/2022 1001   CL 99 10/30/2022 1001   CO2 22 10/30/2022 1001   GLUCOSE 99 10/30/2022  1001   GLUCOSE 99 11/18/2015 1051   GLUCOSE 76 04/02/2013 1241   BUN 12 10/30/2022 1001   CREATININE 0.79 10/30/2022 1001   CREATININE 0.83 11/18/2015 1051   CALCIUM 10.3 (H) 10/30/2022 1001   GFRNONAA 95 12/10/2019 1111   GFRNONAA 89 11/18/2015 1051   GFRAA 109 12/10/2019 1111   GFRAA >89 11/18/2015 1051  Lab Results  Component Value Date   HGBA1C 6.0 (H) 10/30/2022   HGBA1C 5.6 09/17/2018   Lab Results  Component Value Date   INSULIN 34.1 (H) 10/30/2022   Lab Results  Component Value Date   TSH 0.964 10/30/2022   CBC    Component Value Date/Time   WBC 5.9 10/30/2022 1001   WBC 6.6 06/07/2013 0610   RBC 4.53 10/30/2022 1001   RBC 4.37 06/07/2013 0610   HGB 13.6 10/30/2022 1001   HGB 12.1 10/23/2012 0000   HCT 40.6 10/30/2022 1001   HCT 36 10/23/2012 0000   PLT 343 10/30/2022 1001   PLT 336 10/23/2012 0000   MCV 90 10/30/2022 1001   MCH 30.0 10/30/2022 1001   MCH 30.4 06/07/2013 0610   MCHC 33.5 10/30/2022 1001   MCHC 34.2 06/07/2013 0610   RDW 11.7 10/30/2022 1001   Iron Studies No results found for: "IRON", "TIBC", "FERRITIN", "IRONPCTSAT" Lipid Panel     Component Value Date/Time   CHOL 174 10/30/2022 1001   TRIG 89 10/30/2022 1001   HDL 35 (L) 10/30/2022 1001   CHOLHDL 3.3 02/15/2022 1106   LDLCALC 122 (H) 10/30/2022 1001   Hepatic Function Panel     Component Value Date/Time   PROT 7.8 10/30/2022 1001   ALBUMIN 4.6 10/30/2022 1001   AST 25 10/30/2022 1001   ALT 31 10/30/2022 1001   ALKPHOS 66 10/30/2022 1001   BILITOT 0.3 10/30/2022 1001      Component Value Date/Time   TSH 0.964 10/30/2022 1001   Nutritional Lab Results  Component Value Date   VD25OH 25.3 (L) 10/30/2022    Attestations:   Reviewed by clinician on day of visit: allergies, medications, problem list, medical history, surgical history, family history, social history, and previous encounter notes.   Patient was in the office today and time spent on visit including  pre-visit chart review and post-visit care/coordination of care and electronic medical record documentation was 56 minutes. 50% of the time was in face to face counseling of this patient's medical condition(s) and providing education on treatment options to include the first-line treatment of diet and lifestyle modification.  I, Special Randolm Idol, acting as a Stage manager for [employer], DO., have compiled all relevant documentation for today's office visit on behalf of Karen Lot, DO, while in the presence of [employer] DO.  I have reviewed the above documentation for accuracy and completeness, and I agree with the above. Karen Duncan, D.O.  The 21st Century Cures Act was signed into law in 2016 which includes the topic of electronic health records.  This provides immediate access to information in MyChart.  This includes consultation notes, operative notes, office notes, lab results and pathology reports.  If you have any questions about what you read please let us know at your next visit so we can discuss your concerns and take corrective action if need be.  We are right here with you.

## 2022-12-11 ENCOUNTER — Other Ambulatory Visit (INDEPENDENT_AMBULATORY_CARE_PROVIDER_SITE_OTHER): Payer: Self-pay | Admitting: Family Medicine

## 2022-12-11 DIAGNOSIS — E559 Vitamin D deficiency, unspecified: Secondary | ICD-10-CM

## 2022-12-24 ENCOUNTER — Encounter (INDEPENDENT_AMBULATORY_CARE_PROVIDER_SITE_OTHER): Payer: Self-pay | Admitting: Family Medicine

## 2022-12-24 ENCOUNTER — Ambulatory Visit (INDEPENDENT_AMBULATORY_CARE_PROVIDER_SITE_OTHER): Payer: BC Managed Care – PPO | Admitting: Family Medicine

## 2022-12-24 VITALS — BP 133/87 | HR 67 | Temp 98.2°F | Ht 62.0 in | Wt 179.0 lb

## 2022-12-24 DIAGNOSIS — Z6834 Body mass index (BMI) 34.0-34.9, adult: Secondary | ICD-10-CM

## 2022-12-24 DIAGNOSIS — E038 Other specified hypothyroidism: Secondary | ICD-10-CM | POA: Diagnosis not present

## 2022-12-24 DIAGNOSIS — E559 Vitamin D deficiency, unspecified: Secondary | ICD-10-CM

## 2022-12-24 DIAGNOSIS — Z6832 Body mass index (BMI) 32.0-32.9, adult: Secondary | ICD-10-CM

## 2022-12-24 DIAGNOSIS — R7303 Prediabetes: Secondary | ICD-10-CM | POA: Diagnosis not present

## 2022-12-24 DIAGNOSIS — E669 Obesity, unspecified: Secondary | ICD-10-CM

## 2022-12-24 MED ORDER — VITAMIN D (ERGOCALCIFEROL) 1.25 MG (50000 UNIT) PO CAPS: 50000.0000 [IU] | ORAL_CAPSULE | ORAL | 0 refills | Status: DC

## 2022-12-24 NOTE — Progress Notes (Signed)
Carlye Grippe, D.O.  ABFM, ABOM Specializing in Clinical Bariatric Medicine  Office located at: [address] [place], [place]  [address]     Assessment and Plan:   Medications Discontinued During This Encounter  Medication Reason   Vitamin D, Ergocalciferol, (DRISDOL) 1.25 MG (50000 UNIT) CAPS capsule Reorder    Meds ordered this encounter  Medications   Vitamin D, Ergocalciferol, (DRISDOL) 1.25 MG (50000 UNIT) CAPS capsule    Sig: Take 1 capsule (50,000 Units total) by mouth every 7 (seven) days.    Dispense:  4 capsule    Refill:  0    Vitamin D deficiency - new onset Assessment: Condition is Not at goal.. This is being treated with ERGO once weekly. She tolerates this well since starting last OV.  Lab Results  Component Value Date   VD25OH 25.3 (L) 10/30/2022   Plan: - Continue with Ergocalciferol 50K IU weekly. I will refill today.   - Weight loss will likely improve availability of vitamin D, thus encouraged Karen Duncan to continue with meal plan and their weight loss efforts to further improve this condition.  Thus, we will need to monitor levels regularly (every 3-4 mo on average) to keep levels within normal limits and prevent over supplementation.   Prediabetes- new onset Assessment: Condition is Not at goal.. This is diet/exercise controlled. She endorses having hunger and cravings on her menstrual cycle. She notes feeling full when eating everything on the prescribed meal plan. She inquired about Metformin.  Lab Results  Component Value Date   HGBA1C 6.0 (H) 10/30/2022   HGBA1C 5.8 (H) 02/15/2022   HGBA1C 5.5 04/03/2021   INSULIN 34.1 (H) 10/30/2022    Plan: - Reminded Karen Duncan that lifestyle changes are the first line treatment option for most all disease processes.  Education provided that "food is medicine" and I encouraged her to be mindful of how certain foods can improve or worsen her medical conditions.     - In addition, we discussed the risks  and benefits of various medication options such as Metformin which can help [place] in the management of this disease process as well as with weight loss.  Will consider starting one of these meds in future as we will focus on prudent nutritional plan at this time.   - Continue to decrease simple carbs/ sugars; increase fiber and proteins -> follow her meal plan.  I explained role of simple carbs and insulin levels on hunger and cravings  - We will recheck A1c and fasting insulin level in approximately 3 months from last check, or as deemed appropriate.    Other specified hypothyroidism Assessment: This is being well controlled with Synthroid daily. She tolerates this well and denies any adverse side effects.  Lab Results  Component Value Date   TSH 0.964 10/30/2022   Plan: - Continue with Synthroid once daily with food.     TREATMENT PLAN FOR OBESITY: BMI 34.0-34.9,adult - current 32.73 Obesity (BMI 30-39.9) Assessment:  Karen Duncan is here to discuss her progress with her obesity treatment plan along with follow-up of her obesity related diagnoses. See Medical Weight Management Flowsheet for complete bioelectrical impedance results.  Condition is not optimized. Biometric data collected today, was reviewed with patient.   Since last office visit on 12/04/2022 patient's  Muscle mass has decreased by 1lb. Fat mass has decreased by 0.2lb. Total body water has increased by 0.6lb.  Counseling done on how various foods will affect these numbers  and how to maximize success  Total lbs lost to date: 8 Total weight loss percentage to date: 4.28%   Plan: Karen Duncan will work on healthier eating habits and to continue to follow the Category 2 Plan and keeping a food journal and adhering to recommended goals of 1100-1200 calories and 85+ protein with lunch options best they can.   - I advised pt to discuss seeing a physical therapist.   Behavioral Intervention Additional resources provided  today: protein equivalence handout Evidence-based interventions for health behavior change were utilized today including the discussion of self monitoring techniques, problem-solving barriers and SMART goal setting techniques.   Regarding patient's less desirable eating habits and patterns, we employed the technique of small changes.  Pt will specifically work on: journal on paper for next visit.     She has agreed to Unable to participate in physical activity at present due to medical conditions    FOLLOW UP: Return in about 2 weeks (around 01/07/2023).  She was informed of the importance of frequent follow up visits to maximize her success with intensive lifestyle modifications for her multiple health conditions.  Subjective:   Chief complaint: Obesity Karen Duncan is here to discuss her progress with her obesity treatment plan. She is on the the Category 2 Plan and keeping a food journal and adhering to recommended goals of 1100-1200 calories and 85+ protein with lunch options and states she is following her eating plan approximately 90% of the time. She states she was using a elliptical 15 minutes 2 days per week but has not exercise this week due to hip pain.  Interval History:  Karen Duncan is here for a follow up office visit.     Since last office visit:  She notes following the prescribed meal plan. Pt endorses journaling on her phone app only. She notes meeting her calorie goals 99% of the time. She notes it being hard to meet her protein goals as it feels like it is too much. She has bought the wraps recommended last OV.  Pt endorses liking laughing cow wedges and Atkins protein shakes.    We reviewed her meal plan and all questions were answered.   Review of Systems:  Pertinent positives were addressed with patient today.  Reviewed by clinician on day of visit: allergies, medications, problem list, medical history, surgical history, family history, social history, and previous  encounter notes.  Weight Summary and Biometrics   Weight Lost Since Last Visit: 1lb  Weight Gained Since Last Visit: 0lb   Vitals Temp: 98.2 F (36.8 C) BP: 133/87 Pulse Rate: 67 SpO2: 98 %   Anthropometric Measurements Height: 5\' 2"  (1.575 m) Weight: 179 lb (81.2 kg) BMI (Calculated): 32.73 Weight at Last Visit: 180lb Weight Lost Since Last Visit: 1lb Weight Gained Since Last Visit: 0lb Starting Weight: 187lb Total Weight Loss (lbs): 8 lb (3.629 kg) Peak Weight: 192lb   Body Composition  Body Fat %: 39.9 % Fat Mass (lbs): 71.6 lbs Muscle Mass (lbs): 102.4 lbs Total Body Water (lbs): 71.8 lbs Visceral Fat Rating : 9   Other Clinical Data Fasting: no Labs: no Today's Visit #: 3 Starting Date: 10/30/22     Objective:   PHYSICAL EXAM: Blood pressure 133/87, pulse 67, temperature 98.2 F (36.8 C), height 5\' 2"  (1.575 m), weight 179 lb (81.2 kg), SpO2 98%. Body mass index is 32.74 kg/m.  General: Well Developed, well nourished, and in no acute distress.  HEENT: Normocephalic, atraumatic Skin: Warm and dry, cap  RF less 2 sec, good turgor Chest:  Normal excursion, shape, no gross abn Respiratory: speaking in full sentences, no conversational dyspnea NeuroM-Sk: Ambulates w/o assistance, moves * 4 Psych: A and O *3, insight good, mood-full  DIAGNOSTIC DATA REVIEWED:  BMET    Component Value Date/Time   NA 136 10/30/2022 1001   K 4.3 10/30/2022 1001   CL 99 10/30/2022 1001   CO2 22 10/30/2022 1001   GLUCOSE 99 10/30/2022 1001   GLUCOSE 99 11/18/2015 1051   GLUCOSE 76 04/02/2013 1241   BUN 12 10/30/2022 1001   CREATININE 0.79 10/30/2022 1001   CREATININE 0.83 11/18/2015 1051   CALCIUM 10.3 (H) 10/30/2022 1001   GFRNONAA 95 12/10/2019 1111   GFRNONAA 89 11/18/2015 1051   GFRAA 109 12/10/2019 1111   GFRAA >89 11/18/2015 1051   Lab Results  Component Value Date   HGBA1C 6.0 (H) 10/30/2022   HGBA1C 5.6 09/17/2018   Lab Results  Component Value  Date   INSULIN 34.1 (H) 10/30/2022   Lab Results  Component Value Date   TSH 0.964 10/30/2022   CBC    Component Value Date/Time   WBC 5.9 10/30/2022 1001   WBC 6.6 06/07/2013 0610   RBC 4.53 10/30/2022 1001   RBC 4.37 06/07/2013 0610   HGB 13.6 10/30/2022 1001   HGB 12.1 10/23/2012 0000   HCT 40.6 10/30/2022 1001   HCT 36 10/23/2012 0000   PLT 343 10/30/2022 1001   PLT 336 10/23/2012 0000   MCV 90 10/30/2022 1001   MCH 30.0 10/30/2022 1001   MCH 30.4 06/07/2013 0610   MCHC 33.5 10/30/2022 1001   MCHC 34.2 06/07/2013 0610   RDW 11.7 10/30/2022 1001   Iron Studies No results found for: "IRON", "TIBC", "FERRITIN", "IRONPCTSAT" Lipid Panel     Component Value Date/Time   CHOL 174 10/30/2022 1001   TRIG 89 10/30/2022 1001   HDL 35 (L) 10/30/2022 1001   CHOLHDL 3.3 02/15/2022 1106   LDLCALC 122 (H) 10/30/2022 1001   Hepatic Function Panel     Component Value Date/Time   PROT 7.8 10/30/2022 1001   ALBUMIN 4.6 10/30/2022 1001   AST 25 10/30/2022 1001   ALT 31 10/30/2022 1001   ALKPHOS 66 10/30/2022 1001   BILITOT 0.3 10/30/2022 1001      Component Value Date/Time   TSH 0.964 10/30/2022 1001   Nutritional Lab Results  Component Value Date   VD25OH 25.3 (L) 10/30/2022    Attestations:   I, Clinical biochemist, acting as a Stage manager for [employer], DO., have compiled all relevant documentation for today's office visit on behalf of Thomasene Lot, DO, while in the presence of [employer] DO.  I have reviewed the above documentation for accuracy and completeness, and I agree with the above. Carlye Grippe, D.O.  The 21st Century Cures Act was signed into law in 2016 which includes the topic of electronic health records.  This provides immediate access to information in MyChart.  This includes consultation notes, operative notes, office notes, lab results and pathology reports.  If you have any questions about what you read please let us know at your  next visit so we can discuss your concerns and take corrective action if need be.  We are right here with you.

## 2022-12-30 ENCOUNTER — Other Ambulatory Visit (INDEPENDENT_AMBULATORY_CARE_PROVIDER_SITE_OTHER): Payer: Self-pay | Admitting: Family Medicine

## 2022-12-30 DIAGNOSIS — E559 Vitamin D deficiency, unspecified: Secondary | ICD-10-CM

## 2023-01-24 ENCOUNTER — Encounter (INDEPENDENT_AMBULATORY_CARE_PROVIDER_SITE_OTHER): Payer: Self-pay | Admitting: Family Medicine

## 2023-01-24 ENCOUNTER — Ambulatory Visit (INDEPENDENT_AMBULATORY_CARE_PROVIDER_SITE_OTHER): Payer: BC Managed Care – PPO | Admitting: Family Medicine

## 2023-01-24 VITALS — BP 130/88 | HR 82 | Temp 98.4°F | Ht 62.0 in | Wt 173.0 lb

## 2023-01-24 DIAGNOSIS — E559 Vitamin D deficiency, unspecified: Secondary | ICD-10-CM | POA: Diagnosis not present

## 2023-01-24 DIAGNOSIS — E669 Obesity, unspecified: Secondary | ICD-10-CM

## 2023-01-24 DIAGNOSIS — Z6834 Body mass index (BMI) 34.0-34.9, adult: Secondary | ICD-10-CM

## 2023-01-24 DIAGNOSIS — Z6831 Body mass index (BMI) 31.0-31.9, adult: Secondary | ICD-10-CM

## 2023-01-24 MED ORDER — VITAMIN D (ERGOCALCIFEROL) 1.25 MG (50000 UNIT) PO CAPS: 50000.0000 [IU] | ORAL_CAPSULE | ORAL | 0 refills | Status: DC

## 2023-01-24 NOTE — Progress Notes (Signed)
Karen Duncan, D.O.  ABFM, ABOM Specializing in Clinical Bariatric Medicine  Office located at: [address] [place], [place]  [address]   Assessment and Plan:   FOR THE DISEASE OF OBESITY: BMI 34.0-34.9,adult - current 31.63 Obesity (BMI 30-39.9)  Since last office visit on 12/24/22 patient's  muscle mass has decreased by 0.6 lb. Fat mass has decreased by 5.2 lb. Total body water has decreased by 3.2 lb.  Counseling done on how various foods will affect these numbers and how to maximize success  Total lbs lost to date: 14 lbs  Total weight loss percentage to date: 7.49%    Recommended Dietary Goals Karen Duncan is currently in the action stage of change. As such, her goal is to continue weight management plan.  She has agreed to: continue current plan   Behavioral Intervention We discussed the following Behavioral Modification Strategies today: celebration eating strategies and continue to work on maintaining a reduced calorie state, getting the recommended amount of protein, incorporating whole foods, making healthy choices, staying well hydrated and practicing mindfulness when eating.  Additional resources provided today: Handout on holiday eating strategies and journaling log handout.  Evidence-based interventions for health behavior change were utilized today including the discussion of self monitoring techniques, problem-solving barriers and SMART goal setting techniques.   Regarding patient's less desirable eating habits and patterns, we employed the technique of small changes.   Pt will specifically work on: tracking water intake (goal 87+ ounces daily) for next visit.    Recommended Physical Activity Goals Karen Duncan has been advised to work up to 150 minutes of moderate intensity aerobic activity a week and strengthening exercises 2-3 times per week for cardiovascular health, weight loss maintenance and preservation of muscle mass.   She has agreed to : Continue  current level of physical activity    Pharmacotherapy We both agreed to : continue with nutritional and behavioral strategies   FOR ASSOCIATED CONDITIONS ADDRESSED TODAY:  Vitamin D deficiency Assessment & Plan: Most recent vit D of 25.3 on 10/30/22. Endorses taking ERGO 50,000 units once a week.   Continue with weight loss efforts and ERGO at current dose. Vitamin D; end of December recheck.   Orders:  -     Vitamin D (Ergocalciferol); Take 1 capsule (50,000 Units total) by mouth every 7 (seven) days.  Dispense: 4 capsule; Refill: 0   FOLLOW UP: Return 02/21/23. She was informed of the importance of frequent follow up visits to maximize her success with intensive lifestyle modifications for her multiple health conditions.  Subjective:   Chief complaint: Obesity Karen Duncan is here to discuss her progress with her obesity treatment plan. She is on the Category 2 Plan and keeping a food journal and adhering to recommended goals of 1100-1200 calories and 85+ protein and states she is following her eating plan approximately 80% of the time. She states she is walking 30 minutes, 2-3 days a week and sit ups/weights 15 minutes, 2-3 days a week.  Interval History:  Karen Duncan is here for a follow up office visit. Since last OV, Karen Duncan is down 6 lbs. She went on vacation last week. She journaled on vacation and was over in calories and under in protein. However, prior to vacation and after returning, she has been consistently adhering to journaling parameters. She reports being protein focused. Clothes fit looser.   Barriers identified: none  Pharmacotherapy for weight loss: She is currently taking no anti-obesity medication.   Review of Systems:  Pertinent positives were addressed with patient today.  Reviewed by clinician on day of visit: allergies, medications, problem list, medical history, surgical history, family history, social history, and previous encounter notes.  Weight  Summary and Biometrics   Weight Lost Since Last Visit: 6lb  Weight Gained Since Last Visit: 0lb   Vitals Temp: 98.4 F (36.9 C) BP: 130/88 Pulse Rate: 82 SpO2: 97 %   Anthropometric Measurements Height: 5\' 2"  (1.575 m) Weight: 173 lb (78.5 kg) BMI (Calculated): 31.63 Weight at Last Visit: 179lb Weight Lost Since Last Visit: 6lb Weight Gained Since Last Visit: 0lb Starting Weight: 187lb Total Weight Loss (lbs): 14 lb (6.35 kg) Peak Weight: 192lb   Body Composition  Body Fat %: 38.3 % Fat Mass (lbs): 66.4 lbs Muscle Mass (lbs): 101.8 lbs Total Body Water (lbs): 68.6 lbs Visceral Fat Rating : 9   Other Clinical Data Fasting: no Labs: no Today's Visit #: 4 Starting Date: 10/30/22   Objective:   PHYSICAL EXAM: Blood pressure 130/88, pulse 82, temperature 98.4 F (36.9 C), height 5\' 2"  (1.575 m), weight 173 lb (78.5 kg), SpO2 97%. Body mass index is 31.64 kg/m.  General: she is overweight, cooperative and in no acute distress. PSYCH: Has normal mood, affect and thought process.   HEENT: EOMI, sclerae are anicteric. Lungs: Normal breathing effort, no conversational dyspnea. Extremities: Moves * 4 Neurologic: A and O * 3, good insight  DIAGNOSTIC DATA REVIEWED: BMET    Component Value Date/Time   NA 136 10/30/2022 1001   K 4.3 10/30/2022 1001   CL 99 10/30/2022 1001   CO2 22 10/30/2022 1001   GLUCOSE 99 10/30/2022 1001   GLUCOSE 99 11/18/2015 1051   GLUCOSE 76 04/02/2013 1241   BUN 12 10/30/2022 1001   CREATININE 0.79 10/30/2022 1001   CREATININE 0.83 11/18/2015 1051   CALCIUM 10.3 (H) 10/30/2022 1001   GFRNONAA 95 12/10/2019 1111   GFRNONAA 89 11/18/2015 1051   GFRAA 109 12/10/2019 1111   GFRAA >89 11/18/2015 1051   Lab Results  Component Value Date   HGBA1C 6.0 (H) 10/30/2022   HGBA1C 5.6 09/17/2018   Lab Results  Component Value Date   INSULIN 34.1 (H) 10/30/2022   Lab Results  Component Value Date   TSH 0.964 10/30/2022   CBC     Component Value Date/Time   WBC 5.9 10/30/2022 1001   WBC 6.6 06/07/2013 0610   RBC 4.53 10/30/2022 1001   RBC 4.37 06/07/2013 0610   HGB 13.6 10/30/2022 1001   HGB 12.1 10/23/2012 0000   HCT 40.6 10/30/2022 1001   HCT 36 10/23/2012 0000   PLT 343 10/30/2022 1001   PLT 336 10/23/2012 0000   MCV 90 10/30/2022 1001   MCH 30.0 10/30/2022 1001   MCH 30.4 06/07/2013 0610   MCHC 33.5 10/30/2022 1001   MCHC 34.2 06/07/2013 0610   RDW 11.7 10/30/2022 1001   Iron Studies No results found for: "IRON", "TIBC", "FERRITIN", "IRONPCTSAT" Lipid Panel     Component Value Date/Time   CHOL 174 10/30/2022 1001   TRIG 89 10/30/2022 1001   HDL 35 (L) 10/30/2022 1001   CHOLHDL 3.3 02/15/2022 1106   LDLCALC 122 (H) 10/30/2022 1001   Hepatic Function Panel     Component Value Date/Time   PROT 7.8 10/30/2022 1001   ALBUMIN 4.6 10/30/2022 1001   AST 25 10/30/2022 1001   ALT 31 10/30/2022 1001   ALKPHOS 66 10/30/2022 1001   BILITOT 0.3 10/30/2022 1001  Component Value Date/Time   TSH 0.964 10/30/2022 1001   Nutritional Lab Results  Component Value Date   VD25OH 25.3 (L) 10/30/2022    Attestations:   I, Special Puri, acting as a Stage manager for [employer], DO., have compiled all relevant documentation for today's office visit on behalf of Thomasene Lot, DO, while in the presence of [employer], DO.  I have reviewed the above documentation for accuracy and completeness, and I agree with the above. Karen Duncan, D.O.  The 21st Century Cures Act was signed into law in 2016 which includes the topic of electronic health records.  This provides immediate access to information in MyChart.  This includes consultation notes, operative notes, office notes, lab results and pathology reports.  If you have any questions about what you read please let us know at your next visit so we can discuss your concerns and take corrective action if need be.  We are right here with you.

## 2023-01-29 ENCOUNTER — Other Ambulatory Visit (INDEPENDENT_AMBULATORY_CARE_PROVIDER_SITE_OTHER): Payer: Self-pay | Admitting: Family Medicine

## 2023-01-29 DIAGNOSIS — E559 Vitamin D deficiency, unspecified: Secondary | ICD-10-CM

## 2023-02-21 ENCOUNTER — Ambulatory Visit (INDEPENDENT_AMBULATORY_CARE_PROVIDER_SITE_OTHER): Payer: BC Managed Care – PPO | Admitting: Family Medicine

## 2023-02-21 ENCOUNTER — Encounter (INDEPENDENT_AMBULATORY_CARE_PROVIDER_SITE_OTHER): Payer: Self-pay | Admitting: Family Medicine

## 2023-02-21 VITALS — BP 122/72 | HR 73 | Temp 98.0°F | Ht 62.0 in | Wt 169.0 lb

## 2023-02-21 DIAGNOSIS — E559 Vitamin D deficiency, unspecified: Secondary | ICD-10-CM | POA: Diagnosis not present

## 2023-02-21 DIAGNOSIS — F5089 Other specified eating disorder: Secondary | ICD-10-CM

## 2023-02-21 DIAGNOSIS — F332 Major depressive disorder, recurrent severe without psychotic features: Secondary | ICD-10-CM

## 2023-02-21 DIAGNOSIS — Z6834 Body mass index (BMI) 34.0-34.9, adult: Secondary | ICD-10-CM

## 2023-02-21 DIAGNOSIS — E669 Obesity, unspecified: Secondary | ICD-10-CM

## 2023-02-21 DIAGNOSIS — Z6831 Body mass index (BMI) 31.0-31.9, adult: Secondary | ICD-10-CM

## 2023-02-21 MED ORDER — VITAMIN D (ERGOCALCIFEROL) 1.25 MG (50000 UNIT) PO CAPS: 50000.0000 [IU] | ORAL_CAPSULE | ORAL | 1 refills | Status: DC

## 2023-02-21 NOTE — Progress Notes (Incomplete)
Karen Duncan, D.O.  ABFM, ABOM Specializing in Clinical Bariatric Medicine  Office located at: [address] [place], [place]  [address]   Assessment and Plan:   FOR THE DISEASE OF OBESITY: BMI 34.0-34.9,adult - current 31.63 Obesity (BMI 30-39.9) Assessment & Plan: Since last office visit on 12/24/22 patient's  Muscle mass has decreased by 1.8 lb. Fat mass has decreased by 2.4 lb. Total body water has decreased by 1.4 lb.  Counseling done on how various foods will affect these numbers and how to maximize success  Total lbs lost to date: 18 lbs  Total weight loss percentage to date: 11.98%    Recommended Dietary Goals Karen Duncan is currently in the action stage of change. As such, her goal is to continue weight management plan.  She has agreed to: continue current plan   Behavioral Intervention We discussed the following today: increasing lean protein intake to established goals, celebration eating strategies, and maintaining adequate hydration.   Additional resources provided today:  Handout on Food Journaling Log , Handout on Staying on Track during the holidays.   Evidence-based interventions for health behavior change were utilized today including the discussion of self monitoring techniques, problem-solving barriers and SMART goal setting techniques.   Regarding patient's less desirable eating habits and patterns, we employed the technique of small changes.   Pt will specifically work on: n/a   Recommended Physical Activity Goals Virtie has been advised to work up to 150 minutes of moderate intensity aerobic activity a week and strengthening exercises 2-3 times per week for cardiovascular health, weight loss maintenance and preservation of muscle mass.   She has agreed to : exercising 30 minutes, 3 days a week.    Pharmacotherapy We both agreed to : continue with nutritional and behavioral strategies   FOR ASSOCIATED CONDITIONS ADDRESSED TODAY:  Severe  episode of recurrent major depressive disorder, without psychotic features (HCC), with occasional thoughts of self-harm - emotional eating Assessment & Plan: Current mood medication: Zoloft 100 mg daily. Denies SI/HI. Upon discussion with pt, she appears to have an unhealthy relationship with food and has struggles regarding self- worth. Recently, she had a slice of cake and felt guilty about it for several days. Additionally, her husband set up a gym at home and pt's first thought was "He set up the gym because he think's I'm fat".  Pt declines to see our bariatric psychologist Dr.Barker. Discussed the importance of establishing a healthier relationship with food. I strongly encouraged pt to look into counseling. List of local mental health providers given to pt. Additionally, pt was due for a f/up with PCP around 11/9 regarding her mood - she agrees to make that appointment. Continue with Zoloft and weight loss efforts.    Vitamin D deficiency Assessment & Plan: Most recent vitamin D:  Lab Results  Component Value Date   VD25OH 25.3 (L) 10/30/2022   She is on ERGO 50,000 units once a week. She will continue current supplementation regiment and weight loss efforts.   Orders: -     Vitamin D (Ergocalciferol); Take 1 capsule (50,000 Units total) by mouth every 7 (seven) days.  Dispense: 4 capsule; Refill: 1   Follow up:   Return 03/27/23. She was informed of the importance of frequent follow up visits to maximize her success with intensive lifestyle modifications for her multiple health conditions.  Subjective:   Chief complaint: Obesity Takeila is here to discuss her progress with her obesity treatment plan. She is on  the  Category 2 Plan and keeping a food journal and adhering to recommended goals of 1100-1200 calories and 85+ protein and states she is following her eating plan approximately 75% of the time. She states she is not exercising.  Interval History:  Karen Duncan is here for  a follow up office visit. Since last OV, Ajaylah is down 4 lbs. Had a lot of celebration eating events this past month and feels a bit guilty about this. Did not have the time to go to the gym this month. Hunger and cravings are stable. She drinks 88 ounces of water daily.   Pharmacotherapy for weight loss: She is currently taking no anti-obesity medication.   Review of Systems:  Pertinent positives were addressed with patient today.  Reviewed by clinician on day of visit: allergies, medications, problem list, medical history, surgical history, family history, social history, and previous encounter notes.  Weight Summary and Biometrics   Weight Lost Since Last Visit: 4 lb  Weight Gained Since Last Visit: 0    Vitals Temp: 98 F (36.7 C) BP: 122/72 Pulse Rate: 73 SpO2: 93 %   Anthropometric Measurements Height: 5\' 2"  (1.575 m) Weight: 169 lb (76.7 kg) BMI (Calculated): 30.9 Weight at Last Visit: 173 lb Weight Lost Since Last Visit: 4 lb Weight Gained Since Last Visit: 0 Starting Weight: 187 lb Total Weight Loss (lbs): 18 lb (8.165 kg) Peak Weight: 192 lb   Body Composition  Body Fat %: 37.8 % Fat Mass (lbs): 64 lbs Muscle Mass (lbs): 100 lbs Total Body Water (lbs): 67.2 lbs Visceral Fat Rating : 8   Other Clinical Data Fasting: No Labs: No Today's Visit #: 5 Starting Date: 10/30/22   Objective:   PHYSICAL EXAM: Blood pressure 122/72, pulse 73, temperature 98 F (36.7 C), height 5\' 2"  (1.575 m), weight 169 lb (76.7 kg), SpO2 93%. Body mass index is 30.91 kg/m.  General: she is overweight, cooperative and in no acute distress. PSYCH: Has normal mood, affect and thought process.   HEENT: EOMI, sclerae are anicteric. Lungs: Normal breathing effort, no conversational dyspnea. Extremities: Moves * 4 Neurologic: A and O * 3, good insight  DIAGNOSTIC DATA REVIEWED: BMET    Component Value Date/Time   NA 136 10/30/2022 1001   K 4.3 10/30/2022 1001   CL 99  10/30/2022 1001   CO2 22 10/30/2022 1001   GLUCOSE 99 10/30/2022 1001   GLUCOSE 99 11/18/2015 1051   GLUCOSE 76 04/02/2013 1241   BUN 12 10/30/2022 1001   CREATININE 0.79 10/30/2022 1001   CREATININE 0.83 11/18/2015 1051   CALCIUM 10.3 (H) 10/30/2022 1001   GFRNONAA 95 12/10/2019 1111   GFRNONAA 89 11/18/2015 1051   GFRAA 109 12/10/2019 1111   GFRAA >89 11/18/2015 1051   Lab Results  Component Value Date   HGBA1C 6.0 (H) 10/30/2022   HGBA1C 5.6 09/17/2018   Lab Results  Component Value Date   INSULIN 34.1 (H) 10/30/2022   Lab Results  Component Value Date   TSH 0.964 10/30/2022   CBC    Component Value Date/Time   WBC 5.9 10/30/2022 1001   WBC 6.6 06/07/2013 0610   RBC 4.53 10/30/2022 1001   RBC 4.37 06/07/2013 0610   HGB 13.6 10/30/2022 1001   HGB 12.1 10/23/2012 0000   HCT 40.6 10/30/2022 1001   HCT 36 10/23/2012 0000   PLT 343 10/30/2022 1001   PLT 336 10/23/2012 0000   MCV 90 10/30/2022 1001   MCH 30.0  10/30/2022 1001   MCH 30.4 06/07/2013 0610   MCHC 33.5 10/30/2022 1001   MCHC 34.2 06/07/2013 0610   RDW 11.7 10/30/2022 1001   Iron Studies No results found for: "IRON", "TIBC", "FERRITIN", "IRONPCTSAT" Lipid Panel     Component Value Date/Time   CHOL 174 10/30/2022 1001   TRIG 89 10/30/2022 1001   HDL 35 (L) 10/30/2022 1001   CHOLHDL 3.3 02/15/2022 1106   LDLCALC 122 (H) 10/30/2022 1001   Hepatic Function Panel     Component Value Date/Time   PROT 7.8 10/30/2022 1001   ALBUMIN 4.6 10/30/2022 1001   AST 25 10/30/2022 1001   ALT 31 10/30/2022 1001   ALKPHOS 66 10/30/2022 1001   BILITOT 0.3 10/30/2022 1001      Component Value Date/Time   TSH 0.964 10/30/2022 1001   Nutritional Lab Results  Component Value Date   VD25OH 25.3 (L) 10/30/2022    Attestations:   I, Special Puri, acting as a Stage manager for Thomasene Lot, DO., have compiled all relevant documentation for today's office visit on behalf of Thomasene Lot, DO, while in  the presence of [employer], DO.  Patient was in the office today and time spent on visit including pre-visit chart review and post-visit care/coordination of care and electronic medical record documentation was 40 minutes. 50% of that time was in face to face counseling of this patient's medical condition(s) and providing education on treatment options to always include the first-line treatment of diet and lifestyle modification.   I have reviewed the above documentation for accuracy and completeness, and I agree with the above. Karen Duncan, D.O.  The 21st Century Cures Act was signed into law in 2016 which includes the topic of electronic health records.  This provides immediate access to information in MyChart.  This includes consultation notes, operative notes, office notes, lab results and pathology reports.  If you have any questions about what you read please let us know at your next visit so we can discuss your concerns and take corrective action if need be.  We are right here with you.

## 2023-02-25 ENCOUNTER — Encounter: Payer: Self-pay | Admitting: Primary Care

## 2023-02-25 ENCOUNTER — Ambulatory Visit: Payer: BC Managed Care – PPO | Admitting: Primary Care

## 2023-02-25 VITALS — BP 120/76 | HR 77 | Temp 99.2°F | Ht 62.0 in | Wt 173.2 lb

## 2023-02-25 DIAGNOSIS — R0683 Snoring: Secondary | ICD-10-CM

## 2023-02-25 DIAGNOSIS — G2581 Restless legs syndrome: Secondary | ICD-10-CM

## 2023-02-25 NOTE — Progress Notes (Signed)
@Patient  ID: Marvel Plan, female    DOB: 30-Sep-1975, 47 y.o.   MRN: [id number]  Chief Complaint  Patient presents with   Follow-up    Referring provider: [name], DO  HPI: 47 year old female, never smoked.  Past medical history significant for hypertension, hypothyroidism, prediabetes, GERD, obesity.  02/25/2023 Discussed the use of AI scribe software for clinical note transcription with the patient, who gave verbal consent to proceed.  History of Present Illness   The patient presents for a sleep consult due to difficulty maintaining sleep. She reports falling asleep easily within 5-10 minutes, but experiences frequent awakenings, approximately two to three times per night. These awakenings are often prolonged, making it difficult for the patient to return to sleep. The patient also reports significant snoring, to the extent that it has caused her spouse to sleep in a separate room.  The patient has not previously undergone a sleep study. She typically goes to bed around 10 PM and wakes up around 6:45 AM for work. 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 The patient denies smoking and reports only occasional social drinking, approximately one to two drinks per month.  Over the past two years, the patient has experienced weight gain of approximately 30 pounds and is currently seeking help for weight loss. She denies operating heavy machinery and has not used CPAP or oxygen.  The patient reports talking in her sleep and experiencing leg discomfort at night, which sometimes keeps her awake. She describes this discomfort as a twitching sensation. She has not previously taken any medication for these symptoms.  The patient had lab work done approximately three months ago, but it is unclear if iron levels were checked. She is open to having her iron levels checked due to the potential link between low iron and her leg symptoms.     Sleep questionnaire Symptoms- Snoring, trouble  staying asleep   Prior sleep study- none  Bedtime- 10pm  Time to fall asleep- 5-10 mins  Nocturnal awakenings- 2-3 times Out of bed/start of day- 6:45am  Weight changes- up 30lbs  Do you operate heavy machinery- no Do you currently wear CPAP- no Do you current wear oxygen- no   No Known Allergies  Immunization History  Administered Date(s) Administered   Influenza, Seasonal, Injecte, Preservative Fre 11/19/2022   Influenza,inj,Quad PF,6+ Mos 01/22/2013, 11/18/2015, 02/04/2018, 12/10/2019, 04/03/2021   PFIZER(Purple Top)SARS-COV-2 Vaccination 06/04/2019, 06/29/2019   Pfizer Covid-19 Vaccine Bivalent Booster 63yrs & up 04/03/2021   Tdap 03/26/2013    Past Medical History:  Diagnosis Date   Anxiety    on meds   Back pain    Depression    on meds   Edema    GERD (gastroesophageal reflux disease)    Hypertension    on meds   Hypothyroidism    on meds   Joint pain    Pre-diabetes    Seasonal allergies     Tobacco History: Social History   Tobacco Use  Smoking Status Never  Smokeless Tobacco Never   Counseling given: Not Answered   Outpatient Medications Prior to Visit  Medication Sig Dispense Refill   Acetaminophen (TYLENOL 8 HOUR PO) Take by mouth.     fluticasone (FLONASE) 50 MCG/ACT nasal spray Place 2 sprays into both nostrils daily. (Patient taking differently: Place 2 sprays into both nostrils daily as needed.) 16 g 6   levothyroxine (SYNTHROID) 125 MCG tablet Take 1 tablet by mouth every morning 30 minutes before food. 90 tablet 0  losartan (COZAAR) 50 MG tablet Take 1 tablet by mouth at bedtime. 90 tablet 0   Naproxen Sodium (ALEVE PO) Take by mouth.     norethindrone (ERRIN) 0.35 MG tablet Take 1 tablet by mouth daily. 84 tablet 1   sertraline (ZOLOFT) 100 MG tablet Take 1 tablet by mouth daily. **Please schedule follow up visit for further refills.** 90 tablet 1   Vitamin D, Ergocalciferol, (DRISDOL) 1.25 MG (50000 UNIT) CAPS capsule Take 1 capsule  (50,000 Units total) by mouth every 7 (seven) days. 4 capsule 1   No facility-administered medications prior to visit.   Review of Systems  Review of Systems  Constitutional:  Positive for fatigue.  Respiratory: Negative.    Psychiatric/Behavioral:  Positive for sleep disturbance.    Physical Exam  BP 120/76 (BP Location: Left Arm, Patient Position: Sitting, Cuff Size: Normal)   Pulse 77   Temp 99.2 F (37.3 C) (Oral)   Ht 5\' 2"  (1.575 m)   Wt 173 lb 3.2 oz (78.6 kg)   SpO2 95%   BMI 31.68 kg/m  Physical Exam Constitutional:      Appearance: Normal appearance.  HENT:     Head: Normocephalic and atraumatic.     Mouth/Throat:     Mouth: Mucous membranes are moist.     Pharynx: Oropharynx is clear.  Cardiovascular:     Rate and Rhythm: Normal rate and regular rhythm.  Pulmonary:     Effort: Pulmonary effort is normal.     Breath sounds: Normal breath sounds.  Musculoskeletal:        General: Normal range of motion.  Skin:    General: Skin is warm and dry.  Neurological:     Mental Status: She is alert and oriented to person, place, and time. Mental status is at baseline.  Psychiatric:        Mood and Affect: Mood normal.        Behavior: Behavior normal.        Thought Content: Thought content normal.        Judgment: Judgment normal.      Lab Results:  CBC    Component Value Date/Time   WBC 5.9 10/30/2022 1001   WBC 6.6 06/07/2013 0610   RBC 4.53 10/30/2022 1001   RBC 4.37 06/07/2013 0610   HGB 13.6 10/30/2022 1001   HGB 12.1 10/23/2012 0000   HCT 40.6 10/30/2022 1001   HCT 36 10/23/2012 0000   PLT 343 10/30/2022 1001   PLT 336 10/23/2012 0000   MCV 90 10/30/2022 1001   MCH 30.0 10/30/2022 1001   MCH 30.4 06/07/2013 0610   MCHC 33.5 10/30/2022 1001   MCHC 34.2 06/07/2013 0610   RDW 11.7 10/30/2022 1001   LYMPHSABS 2.0 10/30/2022 1001   MONOABS 0.7 10/23/2012 1402   EOSABS 0.1 10/30/2022 1001   BASOSABS 0.1 10/30/2022 1001    BMET     Component Value Date/Time   NA 136 10/30/2022 1001   K 4.3 10/30/2022 1001   CL 99 10/30/2022 1001   CO2 22 10/30/2022 1001   GLUCOSE 99 10/30/2022 1001   GLUCOSE 99 11/18/2015 1051   GLUCOSE 76 04/02/2013 1241   BUN 12 10/30/2022 1001   CREATININE 0.79 10/30/2022 1001   CREATININE 0.83 11/18/2015 1051   CALCIUM 10.3 (H) 10/30/2022 1001   GFRNONAA 95 12/10/2019 1111   GFRNONAA 89 11/18/2015 1051   GFRAA 109 12/10/2019 1111   GFRAA >89 11/18/2015 1051    BNP  Component Value Date/Time   BNP 7.4 04/03/2021 1611    ProBNP No results found for: "PROBNP"  Imaging: No results found.   Assessment & Plan:   1. Loud snoring (Primary) - Polysomnography 4 or more parameters (NPSG); Future  2. Restless leg syndrome - Polysomnography 4 or more parameters (NPSG); Future - Iron, TIBC and Ferritin Panel  Sleep Apnea Reports of snoring and frequent awakenings. No prior sleep study. Discussed the risks/benefits of home vs in-lab sleep study. In-lab study preferred due to restless leg symptoms.  Reviewed risks of untreated sleep apnea including cardiac arrhythmias, pulmonary hypertension, diabetes and stroke.  Also discussed treatment options including weight loss, oral appliance, CPAP therapy referral to ENT for possible surgical options. -Schedule in-lab sleep study expected in mid-January -Follow-up appointment in 6-8 weeks to discuss results  Restless Leg Syndrome Reports of leg discomfort and twitching at night. No prior treatment. -Order iron panel to assess for potential deficiency contributing to symptoms.  Weight Gain Reports of significant weight gain (~30 lbs) in the past two years. Currently seeing a professional for weight loss. -Continue current weight loss efforts.  General Health Maintenance -Continue to monitor alcohol consumption and maintain at current social levels. -Encourage side sleeping position to potentially alleviate sleep apnea symptoms.       Glenford Bayley, NP 02/25/2023

## 2023-02-25 NOTE — Patient Instructions (Addendum)
  Sleep apnea is defined as period of 10 seconds or longer when you stop breathing at night. This can happen multiple times a night. Dx sleep apnea is when this occurs more than 5 times an hour.    Mild OSA 5-15 apneic events an hour Moderate OSA 15-30 apneic events an hour Severe OSA > 30 apneic events an hour   Untreated sleep apnea puts you at higher risk for cardiac arrhythmias, pulmonary HTN, stroke and diabetes   Treatment options include weight loss, side sleeping position, oral appliance, CPAP therapy or referral to ENT for possible surgical options    Recommendations: Focus on side sleeping position or elevate head with wedge pillow 30 degrees Work on weight loss efforts if able  Do not drive if experiencing excessive daytime sleepiness of fatigue    Orders: In-lab sleep study re: loud snoring / restless leg    Follow-up: Please call to schedule follow-up 1-2 weeks after completing home sleep study to review results and treatment if needed (can be virtual)

## 2023-02-26 LAB — IRON,TIBC AND FERRITIN PANEL
%SAT: 19 % (ref 16–45)
Ferritin: 16 ng/mL (ref 16–232)
Iron: 82 ug/dL (ref 40–190)
TIBC: 427 ug/dL (ref 250–450)

## 2023-03-27 ENCOUNTER — Ambulatory Visit (INDEPENDENT_AMBULATORY_CARE_PROVIDER_SITE_OTHER): Payer: BC Managed Care – PPO | Admitting: Internal Medicine

## 2023-04-07 ENCOUNTER — Ambulatory Visit (HOSPITAL_BASED_OUTPATIENT_CLINIC_OR_DEPARTMENT_OTHER): Payer: BC Managed Care – PPO | Attending: Primary Care | Admitting: Internal Medicine

## 2023-04-07 VITALS — Ht 62.0 in | Wt 173.0 lb

## 2023-04-07 DIAGNOSIS — G2581 Restless legs syndrome: Secondary | ICD-10-CM | POA: Diagnosis not present

## 2023-04-07 DIAGNOSIS — R0683 Snoring: Secondary | ICD-10-CM | POA: Insufficient documentation

## 2023-04-10 ENCOUNTER — Other Ambulatory Visit: Payer: Self-pay | Admitting: Family Medicine

## 2023-04-10 DIAGNOSIS — F32A Depression, unspecified: Secondary | ICD-10-CM

## 2023-04-13 DIAGNOSIS — R0683 Snoring: Secondary | ICD-10-CM | POA: Diagnosis not present

## 2023-04-13 NOTE — Procedures (Signed)
     Patient Name: Karen Duncan, Karen Duncan Date: 04/07/2023 Gender: Female D.O.B: 24-Dec-1975 Age (years): 79 Referring Provider: Ames Dura NP Height (inches): 62 Interpreting Physician: Jetty Duhamel MD, ABSM Weight (lbs): 173 RPSGT: Cherylann Parr BMI: 32 MRN: [id number] Neck Size: 14.00  CLINICAL INFORMATION Sleep Study Type: NPSG Indication for sleep study: Snoring, Witnesses Apnea / Gasping During Sleep Epworth Sleepiness Score: 6  SLEEP STUDY TECHNIQUE As per the AASM Manual for the Scoring of Sleep and Associated Events v2.3 (April 2016) with a hypopnea requiring 4% desaturations.  The channels recorded and monitored were frontal, central and occipital EEG, electrooculogram (EOG), submentalis EMG (chin), nasal and oral airflow, thoracic and abdominal wall motion, anterior tibialis EMG, snore microphone, electrocardiogram, and pulse oximetry.  MEDICATIONS Medications self-administered by patient taken the night of the study : N/A  SLEEP ARCHITECTURE The study was initiated at 10:03:44 PM and ended at 5:01:06 AM.  Sleep onset time was 26.8 minutes and the sleep efficiency was 69.2%. The total sleep time was 289 minutes.  Stage REM latency was 199.5 minutes.  The patient spent 6.1% of the night in stage N1 sleep, 68.9% in stage N2 sleep, 0.0% in stage N3 and 25.1% in REM.  Alpha intrusion was absent.  Supine sleep was 0.00%.  RESPIRATORY PARAMETERS The overall apnea/hypopnea index (AHI) was 3.3 per hour. There were 7 total apneas, including 6 obstructive, 1 central and 0 mixed apneas. There were 9 hypopneas and 0 RERAs.  The AHI during Stage REM sleep was 3.3 per hour.  AHI while supine was N/A per hour.  The mean oxygen saturation was 95.3%. The minimum SpO2 during sleep was 89.0%.  moderate snoring was noted during this study.  CARDIAC DATA The 2 lead EKG demonstrated sinus rhythm. The mean heart rate was 70.3 beats per minute. Other EKG findings include:  None.  LEG MOVEMENT DATA The total PLMS were 0 with a resulting PLMS index of 0.0. Associated arousal with leg movement index was 0.0 .  IMPRESSIONS - No significant obstructive sleep apnea occurred during this study (AHI = 3.3/h). - No significant central sleep apnea occurred during this study (CAI = 0.2/h). - The patient had minimal oxygen desaturation during the study (Min O2 = 89.0%, Mean 95.3%) - The patient snored with moderate snoring volume. - No cardiac abnormalities were noted during this study. - Clinically significant periodic limb movements did not occur during sleep. No significant associated arousals. - Several brief spontaneous arousals and awakenings, and awake for an hour between 02:00 and 03:00.  DIAGNOSIS - Primary Snoring  RECOMMENDATIONS - Manage for snoring and insomnia based on clinical judgment. - Sleep hygiene should be reviewed to assess factors that may improve sleep quality. - Weight management and regular exercise should be initiated or continued if appropriate.  [Electronically signed] 04/13/2023 01:00 PM  Jetty Duhamel MD, ABSM Diplomate, American Board of Sleep Medicine NPI: [id number]                        Jetty Duhamel Diplomate, American Board of Sleep Medicine  ELECTRONICALLY SIGNED ON:  04/13/2023, 12:56 PM [facility] PH: [phone]   FX: [phone] ACCREDITED BY THE AMERICAN ACADEMY OF SLEEP MEDICINE

## 2023-04-16 ENCOUNTER — Encounter (INDEPENDENT_AMBULATORY_CARE_PROVIDER_SITE_OTHER): Payer: Self-pay | Admitting: Family Medicine

## 2023-04-16 ENCOUNTER — Ambulatory Visit (INDEPENDENT_AMBULATORY_CARE_PROVIDER_SITE_OTHER): Payer: BC Managed Care – PPO | Admitting: Family Medicine

## 2023-04-16 VITALS — BP 124/81 | HR 72 | Temp 98.3°F | Ht 62.0 in | Wt 171.0 lb

## 2023-04-16 DIAGNOSIS — E559 Vitamin D deficiency, unspecified: Secondary | ICD-10-CM | POA: Diagnosis not present

## 2023-04-16 DIAGNOSIS — E7849 Other hyperlipidemia: Secondary | ICD-10-CM | POA: Diagnosis not present

## 2023-04-16 DIAGNOSIS — E669 Obesity, unspecified: Secondary | ICD-10-CM

## 2023-04-16 DIAGNOSIS — F332 Major depressive disorder, recurrent severe without psychotic features: Secondary | ICD-10-CM

## 2023-04-16 DIAGNOSIS — I10 Essential (primary) hypertension: Secondary | ICD-10-CM

## 2023-04-16 DIAGNOSIS — Z6831 Body mass index (BMI) 31.0-31.9, adult: Secondary | ICD-10-CM

## 2023-04-16 DIAGNOSIS — R7303 Prediabetes: Secondary | ICD-10-CM | POA: Diagnosis not present

## 2023-04-16 DIAGNOSIS — Z6834 Body mass index (BMI) 34.0-34.9, adult: Secondary | ICD-10-CM

## 2023-04-16 MED ORDER — VITAMIN D (ERGOCALCIFEROL) 1.25 MG (50000 UNIT) PO CAPS: 50000.0000 [IU] | ORAL_CAPSULE | ORAL | 1 refills | Status: DC

## 2023-04-16 MED ORDER — VITAMIN D (ERGOCALCIFEROL) 1.25 MG (50000 UNIT) PO CAPS: 50000.0000 [IU] | ORAL_CAPSULE | ORAL | 0 refills | Status: DC

## 2023-04-16 MED ORDER — METFORMIN HCL 500 MG PO TABS: ORAL_TABLET | ORAL | 0 refills | Status: DC

## 2023-04-16 NOTE — Progress Notes (Signed)
 Karen Duncan, D.O.  ABFM, ABOM Specializing in Clinical Bariatric Medicine  Office located at: [address] [place], [place]  [address]   Assessment and Plan:   Obtain labs today (vitamin D , A1C, and LDL direct, lipid panel, and BMP) - review with pt at her next OV.  Unable to do fasting insulin  due to pt not fasting today (ate cheese sticks and deli meat prior to her visit)  FOR THE DISEASE OF OBESITY: BMI 34.0-34.9,adult - Current BMI 31.27 Obesity (BMI 30-39.9) Assessment & Plan: Since last office visit on 02/21/23 patient's muscle mass has decreased by 0.8 lb. Fat mass has increased by 2.8lb. Total body water has increased by 1.4 lb.  Counseling done on how various foods will affect these numbers and how to maximize success  Total lbs lost to date: 16 lbs Total weight loss percentage to date: -8.56%    Recommended Dietary Goals Karen Duncan is currently in the action stage of change. As such, her goal is to continue weight management plan.  She has agreed to: continue current plan   Behavioral Intervention We discussed the following today: increasing lean protein intake to established goals, decreasing simple carbohydrates , increasing vegetables, avoiding skipping meals, increasing water intake , work on meal planning and preparation, keeping healthy foods at home, work on managing stress, creating time for self-care and relaxation, and continue to practice mindfulness when eating  Additional resources provided today: Handout on FDA approved anti-obesity medications and adverse effects (Metformin ).   Evidence-based interventions for health behavior change were utilized today including the discussion of self monitoring techniques, problem-solving barriers and SMART goal setting techniques.   Regarding patient's less desirable eating habits and patterns, we employed the technique of small changes.   Pt will specifically work on: look into EAP counseling, 2 sessions of  statinoary bike or home videos per week, and walking twice a week for 30 minutes for next visit.    Recommended Physical Activity Goals Karen Duncan has been advised to work up to 150 minutes of moderate intensity aerobic activity a week and strengthening exercises 2-3 times per week for cardiovascular health, weight loss maintenance and preservation of muscle mass.   She has agreed to :  Think about enjoyable ways to increase daily physical activity and overcoming barriers to exercise, Increase physical activity in their day and reduce sedentary time (increase NEAT)., and Increase the intensity, frequency or duration of aerobic exercises     Pharmacotherapy We discussed various medication options to help Karen Duncan with her weight loss efforts and we both agreed to:  Start Metformin  500 mg once daily with lunch   FOR ASSOCIATED CONDITIONS ADDRESSED TODAY:  Vitamin D  deficiency Assessment & Plan: Last vitamin D  was sub-optimal at 25.3 about 5 months ago. She is on ERGO 50K units once weekly with good compliance and tolerance. No acute concerns.   Continue with current supplementation regimen as directed. Will recheck vitamin D  levels and review with pt at her next OV.   Orders: - Refill ERGO - Recheck Vitamin D    Prediabetes- new onset Assessment & Plan: Last A1C from about 5 months ago was at 6.0. Pt is interested in information for Metformin  for prediabetes. Pt ensures she has gotten back on track with her meal plan by today's office visit.   Reviewed the risks/benefits of Metformin  and stressed to the importance of staying on plan to avoid side effects. Will start pt on Metformin  500 mg today, take daily with lunch for  optimal effectiveness in the evening/nighttime. Encouraged pt to start by taking a half tab BID for 2 days prior to taking prescribed dose. Reiterated to pt that if she eats off plan and eats a lot of carbs she may have GI upset, loose stools, and other possible SE. Will recheck  A1c and BMP and review with pt at her next OV.   Orders: - Start Metformin  500 mg once daily with lunch - Recheck A1C  - Recheck BMP   Other hyperlipidemia Assessment & Plan: Labs from about 5 months ago showed HDL was below goal at 35 and LDL was elevated at 122. Pt not on meds currently. Diet/exercise approach.   Follow heart healthy diet via her prudent meal plan. Avoid fatty meats, butters and other saturated and trans fats. Will continue to monitor condition. Will recheck LDL direct and lipid panel today to review with pt at her next OV.   Orders: - Recheck LDL cholesterol, direct  - Recheck lipid panel, (non-fasting)   Severe episode of recurrent major depressive disorder, without psychotic features (HCC), emotional eating Assessment & Plan: She is on Zoloft  100 mg once daily. Tolerating well. Pt states she is aware she needs to find a behavioral health specialist but finds it difficult to talk to strangers about her life situations. She has met with a therapist before but did not connect well. States she will try to give it another try.   Encouraged pt to look into EAP counseling. Reviewed the importance of focusing on self-care and stress management. Counseled pt on how uncontrolled moods and stress can make weight loss more difficult. Will continue to monitor condition as it pertains to her weight loss journey.    Primary hypertension Assessment & Plan: BP Readings from Last 3 Encounters:  04/16/23 124/81  02/25/23 120/76  02/21/23 122/72   BP is stable. Pt is on Cozaar  50 mg once daily with good compliance and tolerance. No acute concerns today. Continue with current antihypertensive regimen as directed by PCP. No changes made. Will continue to monitor alongside PCP.   Follow up:   Return in about 4 weeks (around 05/14/2023). She was informed of the importance of frequent follow up visits to maximize her success with intensive lifestyle modifications for her multiple  health conditions.  Subjective:   Chief complaint: Obesity Karen Duncan is here to discuss her progress with her obesity treatment plan. She is on the Category 2 Plan and keeping a food journal and adhering to recommended goals of 1100-1200 calories and 85+ protein and states she is following her eating plan approximately 50% of the time. She states she is doing the stationary bike 30 minutes 1 day per week and at home exercises.   Interval History:  Karen Duncan is here for a follow up office visit. Since last OV, she is up 2 lbs. Not taking care of herself due to her husband losing his best friend in a tragic way. In her trying to care for her husband she has struggled to stay on plan and focus on self-care. She recently went on vacation with her husband and also struggled to stay on plan while vacationing. She adds her hunger/craving increases when she is on her menses. She notes that she has recently gotten back on track with her meal plan.   Barriers identified: lack of time for self-care, multiple competing priorities, having difficulty preparing healthy meals, having difficulty focusing on healthy eating, and moderate to high levels of stress.   Pharmacotherapy  for weight loss: She is currently taking no anti-obesity medication.   Review of Systems:  Pertinent positives were addressed with patient today.  Reviewed by clinician on day of visit: allergies, medications, problem list, medical history, surgical history, family history, social history, and previous encounter notes.  Weight Summary and Biometrics   Weight Lost Since Last Visit: 0  Weight Gained Since Last Visit: 2lb    Vitals Temp: 98.3 F (36.8 C) BP: 124/81 Pulse Rate: 72 SpO2: 98 %   Anthropometric Measurements Height: 5' 2 (1.575 m) Weight: 171 lb (77.6 kg) BMI (Calculated): 31.27 Weight at Last Visit: 169lb Weight Lost Since Last Visit: 0 Weight Gained Since Last Visit: 2lb Starting Weight: 187lb Total  Weight Loss (lbs): 16 lb (7.258 kg) Peak Weight: 192lb   Body Composition  Body Fat %: 39 % Fat Mass (lbs): 66.8 lbs Muscle Mass (lbs): 99.2 lbs Total Body Water (lbs): 68.6 lbs Visceral Fat Rating : 9   Other Clinical Data Fasting: no Today's Visit #: 6 Starting Date: 10/30/22    Objective:   PHYSICAL EXAM: Blood pressure 124/81, pulse 72, temperature 98.3 F (36.8 C), height 5' 2 (1.575 m), weight 171 lb (77.6 kg), SpO2 98%. Body mass index is 31.28 kg/m.  General: she is overweight, cooperative and in no acute distress. PSYCH: Has normal mood, affect and thought process.   HEENT: EOMI, sclerae are anicteric. Lungs: Normal breathing effort, no conversational dyspnea. Extremities: Moves * 4 Neurologic: A and O * 3, good insight  DIAGNOSTIC DATA REVIEWED: BMET    Component Value Date/Time   NA 136 10/30/2022 1001   K 4.3 10/30/2022 1001   CL 99 10/30/2022 1001   CO2 22 10/30/2022 1001   GLUCOSE 99 10/30/2022 1001   GLUCOSE 99 11/18/2015 1051   GLUCOSE 76 04/02/2013 1241   BUN 12 10/30/2022 1001   CREATININE 0.79 10/30/2022 1001   CREATININE 0.83 11/18/2015 1051   CALCIUM 10.3 (H) 10/30/2022 1001   GFRNONAA 95 12/10/2019 1111   GFRNONAA 89 11/18/2015 1051   GFRAA 109 12/10/2019 1111   GFRAA >89 11/18/2015 1051   Lab Results  Component Value Date   HGBA1C 6.0 (H) 10/30/2022   HGBA1C 5.6 09/17/2018   Lab Results  Component Value Date   INSULIN  34.1 (H) 10/30/2022   Lab Results  Component Value Date   TSH 0.964 10/30/2022   CBC    Component Value Date/Time   WBC 5.9 10/30/2022 1001   WBC 6.6 06/07/2013 0610   RBC 4.53 10/30/2022 1001   RBC 4.37 06/07/2013 0610   HGB 13.6 10/30/2022 1001   HGB 12.1 10/23/2012 0000   HCT 40.6 10/30/2022 1001   HCT 36 10/23/2012 0000   PLT 343 10/30/2022 1001   PLT 336 10/23/2012 0000   MCV 90 10/30/2022 1001   MCH 30.0 10/30/2022 1001   MCH 30.4 06/07/2013 0610   MCHC 33.5 10/30/2022 1001   MCHC 34.2  06/07/2013 0610   RDW 11.7 10/30/2022 1001   Iron Studies    Component Value Date/Time   IRON 82 02/25/2023 0909   TIBC 427 02/25/2023 0909   FERRITIN 16 02/25/2023 0909   IRONPCTSAT 19 02/25/2023 0909   Lipid Panel     Component Value Date/Time   CHOL 174 10/30/2022 1001   TRIG 89 10/30/2022 1001   HDL 35 (L) 10/30/2022 1001   CHOLHDL 3.3 02/15/2022 1106   LDLCALC 122 (H) 10/30/2022 1001   Hepatic Function Panel     Component  Value Date/Time   PROT 7.8 10/30/2022 1001   ALBUMIN 4.6 10/30/2022 1001   AST 25 10/30/2022 1001   ALT 31 10/30/2022 1001   ALKPHOS 66 10/30/2022 1001   BILITOT 0.3 10/30/2022 1001      Component Value Date/Time   TSH 0.964 10/30/2022 1001   Nutritional Lab Results  Component Value Date   VD25OH 25.3 (L) 10/30/2022    Attestations:   LILLETTE Vernell Forest, acting as a medical scribe for Karen Jenkins, DO., have compiled all relevant documentation for today's office visit on behalf of Karen Jenkins, DO, while in the presence of [employer], DO.  Reviewed by clinician on day of visit: allergies, medications, problem list, medical history, surgical history, family history, social history, and previous encounter notes pertinent to patient's obesity diagnosis.  I have reviewed the above documentation for accuracy and completeness, and I agree with the above. Karen JINNY Duncan, D.O.  The 21st Century Cures Act was signed into law in 2016 which includes the topic of electronic health records.  This provides immediate access to information in MyChart.  This includes consultation notes, operative notes, office notes, lab results and pathology reports.  If you have any questions about what you read please let us  know at your next visit so we can discuss your concerns and take corrective action if need be.  We are right here with you.

## 2023-04-17 LAB — BASIC METABOLIC PANEL
BUN/Creatinine Ratio: 16 (ref 9–23)
BUN: 12 mg/dL (ref 6–24)
CO2: 22 mmol/L (ref 20–29)
Calcium: 10.2 mg/dL (ref 8.7–10.2)
Chloride: 103 mmol/L (ref 96–106)
Creatinine, Ser: 0.76 mg/dL (ref 0.57–1.00)
Glucose: 94 mg/dL (ref 70–99)
Potassium: 4.5 mmol/L (ref 3.5–5.2)
Sodium: 138 mmol/L (ref 134–144)
eGFR: 98 mL/min/{1.73_m2} (ref >59)

## 2023-04-17 LAB — LIPID PANEL
Chol/HDL Ratio: 4.9 {ratio} — ABNORMAL HIGH (ref 0.0–4.4)
Cholesterol, Total: 171 mg/dL (ref 100–199)
HDL: 35 mg/dL — ABNORMAL LOW (ref >39)
LDL Chol Calc (NIH): 113 mg/dL — ABNORMAL HIGH (ref 0–99)
Triglycerides: 124 mg/dL (ref 0–149)
VLDL Cholesterol Cal: 23 mg/dL (ref 5–40)

## 2023-04-17 LAB — HEMOGLOBIN A1C
Est. average glucose Bld gHb Est-mCnc: 114 mg/dL
Hgb A1c MFr Bld: 5.6 % (ref 4.8–5.6)

## 2023-04-17 LAB — LDL CHOLESTEROL, DIRECT: LDL Direct: 112 mg/dL — ABNORMAL HIGH (ref 0–99)

## 2023-04-17 LAB — VITAMIN D 25 HYDROXY (VIT D DEFICIENCY, FRACTURES): Vit D, 25-Hydroxy: 41.8 ng/mL (ref 30.0–100.0)

## 2023-05-07 ENCOUNTER — Other Ambulatory Visit: Payer: Self-pay | Admitting: Family Medicine

## 2023-05-07 DIAGNOSIS — I10 Essential (primary) hypertension: Secondary | ICD-10-CM

## 2023-05-07 MED ORDER — LOSARTAN POTASSIUM 50 MG PO TABS: 50.0000 mg | ORAL_TABLET | Freq: Every day | ORAL | 0 refills | Status: DC

## 2023-05-16 ENCOUNTER — Encounter (INDEPENDENT_AMBULATORY_CARE_PROVIDER_SITE_OTHER): Payer: Self-pay | Admitting: Family Medicine

## 2023-05-16 ENCOUNTER — Ambulatory Visit (INDEPENDENT_AMBULATORY_CARE_PROVIDER_SITE_OTHER): Payer: BC Managed Care – PPO | Admitting: Family Medicine

## 2023-05-16 VITALS — BP 119/67 | HR 69 | Temp 98.0°F | Ht 62.0 in | Wt 168.0 lb

## 2023-05-16 DIAGNOSIS — Z6834 Body mass index (BMI) 34.0-34.9, adult: Secondary | ICD-10-CM

## 2023-05-16 DIAGNOSIS — E559 Vitamin D deficiency, unspecified: Secondary | ICD-10-CM

## 2023-05-16 DIAGNOSIS — Z6830 Body mass index (BMI) 30.0-30.9, adult: Secondary | ICD-10-CM

## 2023-05-16 DIAGNOSIS — R7303 Prediabetes: Secondary | ICD-10-CM

## 2023-05-16 DIAGNOSIS — E669 Obesity, unspecified: Secondary | ICD-10-CM | POA: Diagnosis not present

## 2023-05-16 DIAGNOSIS — E7849 Other hyperlipidemia: Secondary | ICD-10-CM

## 2023-05-16 MED ORDER — METFORMIN HCL 500 MG PO TABS: ORAL_TABLET | ORAL | 0 refills | Status: DC

## 2023-05-16 MED ORDER — VITAMIN D (ERGOCALCIFEROL) 1.25 MG (50000 UNIT) PO CAPS: 50000.0000 [IU] | ORAL_CAPSULE | ORAL | 0 refills | Status: AC

## 2023-05-16 MED ORDER — VITAMIN D3 25 MCG (1000 UT) PO CAPS: ORAL_CAPSULE | ORAL | Status: AC

## 2023-05-16 NOTE — Progress Notes (Signed)
 Karen Duncan, D.O.  ABFM, ABOM Specializing in Clinical Bariatric Medicine  Office located at: [address] [place], [place]  [address]   Assessment and Plan:   FOR THE DISEASE OF OBESITY:  BMI 34.0-34.9,adult - Current BMI 30.72 Obesity (BMI 30-39.9) - start BMI 34.19 Assessment & Plan: Since last office visit on 04/16/2023 patient's  Muscle mass has increased by 1.4 lb. Fat mass has decreased by 4.6 lb. Total body water has decreased by 2.4 lb.  Counseling done on how various foods will affect these numbers and how to maximize success  Total lbs lost to date: 19 lbs  Total weight loss percentage to date: 10.16%    Recommended Dietary Goals Karen Duncan is currently in the action stage of change. As such, her goal is to continue weight management plan.  She has agreed to: continue current plan   Behavioral Intervention We discussed the following today: continue to work on maintaining a reduced calorie state, getting the recommended amount of protein, incorporating whole foods, making healthy choices, staying well hydrated and practicing mindfulness when eating.  Additional resources provided today: None  Evidence-based interventions for health behavior change were utilized today including the discussion of self monitoring techniques, problem-solving barriers and SMART goal setting techniques.   Regarding patient's less desirable eating habits and patterns, we employed the technique of small changes.   Pt will specifically work on: n/a   Recommended Physical Activity Goals Karen Duncan has been advised to work up to 150 minutes of moderate intensity aerobic activity a week and strengthening exercises 2-3 times per week for cardiovascular health, weight loss maintenance and preservation of muscle mass.   She has agreed to : increase exercise to 30 minutes 5 days a week.   Pharmacotherapy See pre-dm note.    FOR ASSOCIATED CONDITIONS ADDRESSED TODAY:  Other  hyperlipidemia Assessment & Plan: Lab Results  Component Value Date   CHOL 171 04/16/2023   HDL 35 (L) 04/16/2023   LDLCALC 113 (H) 04/16/2023   LDLDIRECT 112 (H) 04/16/2023   TRIG 124 04/16/2023   CHOLHDL 4.9 (H) 04/16/2023   Diet/exercise controlled. LDL improved from 122 to 112. HDL not at goal - no improvement from 6 months ago. Pt advised to continue to reduce saturated and trans fats in diet. Discussed that increasing exercise and consuming healthy fats can raise HDL. Continue nutrition plan.   Prediabetes Assessment & Plan: Lab Results  Component Value Date   HGBA1C 5.6 04/16/2023   HGBA1C 6.0 (H) 10/30/2022   HGBA1C 5.8 (H) 02/15/2022   Lab Results  Component Value Date   CREATININE 0.76 04/16/2023   BUN 12 04/16/2023   NA 138 04/16/2023   K 4.5 04/16/2023   CL 103 04/16/2023   CO2 22 04/16/2023   Started pt on Metformin 500 mg daily with lunch LOV. Denies gastrointestinal issues. Still craves "sweets or crunchy" foods in the evenings although less than before. Lab wise, Hemoglobin A1c improved from 6.0 to 5.6. Renal function and electrolytes WNL. Shared decision: Increase Metformin. Continue meal plan.   Relevant Orders:  -     metFORMIN HCl; 1 po with lunch and dinner daily  Dispense: 70 tablet; Refill: 0   Vitamin D deficiency Assessment & Plan: Lab Results  Component Value Date   VD25OH 41.8 04/16/2023   VD25OH 25.3 (L) 10/30/2022   Pt reports good compliance and tolerance of ERGO 50,000 units once weekly. Her vit D levels are not at goal of 50 to 70.  Shared decision: continue high dose vitamin D & start OTC Vitamin D 2,000 units daily. Recheck - 3 months.   Relevant Orders:  -     Vitamin D (Ergocalciferol); Take 1 capsule (50,000 Units total) by mouth every 7 (seven) days.  Dispense: 5 capsule; Refill: 0 -     Vitamin D3; 50 mcg or 2,000 units daily   Follow up:   Return 06/24/2023. She was informed of the importance of frequent follow up visits to  maximize her success with intensive lifestyle modifications for her multiple health conditions.  Subjective:   Chief complaint: Obesity Karen Duncan is here to discuss her progress with her obesity treatment plan. She is on the Category 2 Plan and keeping a food journal and adhering to recommended goals of 1100-1200 calories and 85+ protein and states she is following her eating plan approximately 85% of the time. She states she is  doing elliptical or pilates 30 minutes 2-3 days per week.  Interval History:  Karen Duncan is here for a follow up office visit. Since last OV on 04/16/2023, Karen Duncan is down 3 lbs. Started pt on Metformin 500 mg daily with lunch LOV. Denies gastrointestinal issues. Still craves "sweets or crunchy" foods in the evenings although less than before. Starting counseling through [facility] end of April. Going to [place] very soon for mothers bday.   Pharmacotherapy for weight loss: She is currently taking  Metformin 500 mg once daily .   Review of Systems:  Pertinent positives were addressed with patient today.  Reviewed by clinician on day of visit: allergies, medications, problem list, medical history, surgical history, family history, social history, and previous encounter notes.  Weight Summary and Biometrics   Weight Lost Since Last Visit: 3 lb  Weight Gained Since Last Visit: 0   Vitals Temp: 98 F (36.7 C) BP: 119/67 Pulse Rate: 69 SpO2: 99 %   Anthropometric Measurements Height: 5\' 2"  (1.575 m) Weight: 168 lb (76.2 kg) BMI (Calculated): 30.72 Weight at Last Visit: 171 lb Weight Lost Since Last Visit: 3 lb Weight Gained Since Last Visit: 0 Starting Weight: 187 lb Total Weight Loss (lbs): 19 lb (8.618 kg) Peak Weight: 192 lb   Body Composition  Body Fat %: 37 % Fat Mass (lbs): 62.2 lbs Muscle Mass (lbs): 100.6 lbs Total Body Water (lbs): 66.2 lbs Visceral Fat Rating : 8   Other Clinical Data Fasting: No Labs: No Today's Visit #: 7 Starting  Date: 10/30/22   Objective:   PHYSICAL EXAM: Blood pressure 119/67, pulse 69, temperature 98 F (36.7 C), height 5\' 2"  (1.575 m), weight 168 lb (76.2 kg), SpO2 99%. Body mass index is 30.73 kg/m.  General: she is overweight, cooperative and in no acute distress. PSYCH: Has normal mood, affect and thought process.   HEENT: EOMI, sclerae are anicteric. Lungs: Normal breathing effort, no conversational dyspnea. Extremities: Moves * 4 Neurologic: A and O * 3, good insight  DIAGNOSTIC DATA REVIEWED: BMET    Component Value Date/Time   NA 138 04/16/2023 0941   K 4.5 04/16/2023 0941   CL 103 04/16/2023 0941   CO2 22 04/16/2023 0941   GLUCOSE 94 04/16/2023 0941   GLUCOSE 99 11/18/2015 1051   GLUCOSE 76 04/02/2013 1241   BUN 12 04/16/2023 0941   CREATININE 0.76 04/16/2023 0941   CREATININE 0.83 11/18/2015 1051   CALCIUM 10.2 04/16/2023 0941   GFRNONAA 95 12/10/2019 1111   GFRNONAA 89 11/18/2015 1051   GFRAA 109 12/10/2019 1111  GFRAA >89 11/18/2015 1051   Lab Results  Component Value Date   HGBA1C 5.6 04/16/2023   HGBA1C 5.6 09/17/2018   Lab Results  Component Value Date   INSULIN 34.1 (H) 10/30/2022   Lab Results  Component Value Date   TSH 0.964 10/30/2022   CBC    Component Value Date/Time   WBC 5.9 10/30/2022 1001   WBC 6.6 06/07/2013 0610   RBC 4.53 10/30/2022 1001   RBC 4.37 06/07/2013 0610   HGB 13.6 10/30/2022 1001   HGB 12.1 10/23/2012 0000   HCT 40.6 10/30/2022 1001   HCT 36 10/23/2012 0000   PLT 343 10/30/2022 1001   PLT 336 10/23/2012 0000   MCV 90 10/30/2022 1001   MCH 30.0 10/30/2022 1001   MCH 30.4 06/07/2013 0610   MCHC 33.5 10/30/2022 1001   MCHC 34.2 06/07/2013 0610   RDW 11.7 10/30/2022 1001   Iron Studies    Component Value Date/Time   IRON 82 02/25/2023 0909   TIBC 427 02/25/2023 0909   FERRITIN 16 02/25/2023 0909   IRONPCTSAT 19 02/25/2023 0909   Lipid Panel     Component Value Date/Time   CHOL 171 04/16/2023 0941   TRIG  124 04/16/2023 0941   HDL 35 (L) 04/16/2023 0941   CHOLHDL 4.9 (H) 04/16/2023 0941   LDLCALC 113 (H) 04/16/2023 0941   LDLDIRECT 112 (H) 04/16/2023 0941   Hepatic Function Panel     Component Value Date/Time   PROT 7.8 10/30/2022 1001   ALBUMIN 4.6 10/30/2022 1001   AST 25 10/30/2022 1001   ALT 31 10/30/2022 1001   ALKPHOS 66 10/30/2022 1001   BILITOT 0.3 10/30/2022 1001      Component Value Date/Time   TSH 0.964 10/30/2022 1001   Nutritional Lab Results  Component Value Date   VD25OH 41.8 04/16/2023   VD25OH 25.3 (L) 10/30/2022    Attestations:   I, Special Puri, acting as a Stage manager for Karen Lot, DO., have compiled all relevant documentation for today's office visit on behalf of Karen Lot, DO, while in the presence of [employer], DO.  I have reviewed the above documentation for accuracy and completeness, and I agree with the above. Karen Duncan, D.O.  The 21st Century Cures Act was signed into law in 2016 which includes the topic of electronic health records.  This provides immediate access to information in MyChart.  This includes consultation notes, operative notes, office notes, lab results and pathology reports.  If you have any questions about what you read please let us know at your next visit so we can discuss your concerns and take corrective action if need be.  We are right here with you.

## 2023-05-20 ENCOUNTER — Ambulatory Visit: Payer: BC Managed Care – PPO | Admitting: Primary Care

## 2023-05-20 ENCOUNTER — Encounter: Payer: Self-pay | Admitting: Primary Care

## 2023-05-20 VITALS — BP 126/72 | HR 71 | Temp 97.8°F | Ht 62.0 in | Wt 172.8 lb

## 2023-05-20 DIAGNOSIS — R0683 Snoring: Secondary | ICD-10-CM | POA: Diagnosis not present

## 2023-05-20 NOTE — Patient Instructions (Addendum)
 -SLEEP MAINTENANCE INSOMNIA: Sleep maintenance insomnia means having trouble staying asleep through the night. Your sleep study showed no sleep apnea but did show moderate snoring and occasional apneic events. We recommend seeing an orthodontic dentist, Dr. Irene Limbo, for a consultation about an oral appliance to help with snoring and occasional apneas. You can also try over-the-counter melatonin (3-5mg ) to help you sleep. Additionally, practice good sleep hygiene by keeping your sleep environment cool and dark, avoiding sleeping on your back, and using your bed only for sleep. If your sleep problems continue, consider cognitive behavioral therapy for sleep training. We will follow up in 6 months or sooner if needed.  Referral: Orthodontics re: snoring  Dr. Irene Limbo, DDS  [email or web address], Dds [address] [place], [place] [address]  2.7 mi [phone]  INSTRUCTIONS:  Please schedule a consultation with Dr. Irene Limbo, the orthodontic dentist, to discuss an oral appliance for your snoring and occasional apneas. Try taking over-the-counter melatonin (3-5mg ) before bed. Maintain good sleep hygiene by keeping your bedroom cool and dark, avoiding back sleeping, and using your bed only for sleep. If your sleep issues persist, consider cognitive behavioral therapy for sleep training. Follow up with [place] in 6 months or sooner if your sleep disruptions continue.  Follow-up 6 months with Waynetta Sandy NP    Insomnia Insomnia is a sleep disorder that makes it difficult to fall asleep or stay asleep. Insomnia can cause fatigue, low energy, difficulty concentrating, mood swings, and poor performance at work or school. There are three different ways to classify insomnia: Difficulty falling asleep. Difficulty staying asleep. Waking up too early in the morning. Any type of insomnia can be long-term (chronic) or short-term (acute). Both are common. Short-term insomnia  usually lasts for 3 months or less. Chronic insomnia occurs at least three times a week for longer than 3 months. What are the causes? Insomnia may be caused by another condition, situation, or substance, such as: Having certain mental health conditions, such as anxiety and depression. Using caffeine, alcohol, tobacco, or drugs. Having gastrointestinal conditions, such as gastroesophageal reflux disease (GERD). Having certain medical conditions. These include: Asthma. Alzheimer's disease. Stroke. Chronic pain. An overactive thyroid gland (hyperthyroidism). Other sleep disorders, such as restless legs syndrome and sleep apnea. Menopause. Sometimes, the cause of insomnia may not be known. What increases the risk? Risk factors for insomnia include: Gender. Females are affected more often than males. Age. Insomnia is more common as people get older. Stress and certain medical and mental health conditions. Lack of exercise. Having an irregular work schedule. This may include working night shifts and traveling between different time zones. What are the signs or symptoms? If you have insomnia, the main symptom is having trouble falling asleep or having trouble staying asleep. This may lead to other symptoms, such as: Feeling tired or having low energy. Feeling nervous about going to sleep. Not feeling rested in the morning. Having trouble concentrating. Feeling irritable, anxious, or depressed. How is this diagnosed? This condition may be diagnosed based on: Your symptoms and medical history. Your health care provider may ask about: Your sleep habits. Any medical conditions you have. Your mental health. A physical exam. How is this treated? Treatment for insomnia depends on the cause. Treatment may focus on treating an underlying condition that is causing the insomnia. Treatment may also include: Medicines to help you sleep. Counseling or therapy. Lifestyle adjustments to help you  sleep better. Follow these instructions at home: Eating and drinking  Limit or avoid alcohol, caffeinated beverages, and products that contain nicotine and tobacco, especially close to bedtime. These can disrupt your sleep. Do not eat a large meal or eat spicy foods right before bedtime. This can lead to digestive discomfort that can make it hard for you to sleep. Sleep habits  Keep a sleep diary to help you and your health care provider figure out what could be causing your insomnia. Write down: When you sleep. When you wake up during the night. How well you sleep and how rested you feel the next day. Any side effects of medicines you are taking. What you eat and drink. Make your bedroom a dark, comfortable place where it is easy to fall asleep. Put up shades or blackout curtains to block light from outside. Use a white noise machine to block noise. Keep the temperature cool. Limit screen use before bedtime. This includes: Not watching TV. Not using your smartphone, tablet, or computer. Stick to a routine that includes going to bed and waking up at the same times every day and night. This can help you fall asleep faster. Consider making a quiet activity, such as reading, part of your nighttime routine. Try to avoid taking naps during the day so that you sleep better at night. Get out of bed if you are still awake after 15 minutes of trying to sleep. Keep the lights down, but try reading or doing a quiet activity. When you feel sleepy, go back to bed. General instructions Take over-the-counter and prescription medicines only as told by your health care provider. Exercise regularly as told by your health care provider. However, avoid exercising in the hours right before bedtime. Use relaxation techniques to manage stress. Ask your health care provider to suggest some techniques that may work well for you. These may include: Breathing exercises. Routines to release muscle  tension. Visualizing peaceful scenes. Make sure that you drive carefully. Do not drive if you feel very sleepy. Keep all follow-up visits. This is important. Contact a health care provider if: You are tired throughout the day. You have trouble in your daily routine due to sleepiness. You continue to have sleep problems, or your sleep problems get worse. Get help right away if: You have thoughts about hurting yourself or someone else. Get help right away if you feel like you may hurt yourself or others, or have thoughts about taking your own life. Go to your nearest emergency room or: Call 911. Call the National Suicide Prevention Lifeline at (631)345-0474 or 988. This is open 24 hours a day. Text the Crisis Text Line at (423)410-1967. Summary Insomnia is a sleep disorder that makes it difficult to fall asleep or stay asleep. Insomnia can be long-term (chronic) or short-term (acute). Treatment for insomnia depends on the cause. Treatment may focus on treating an underlying condition that is causing the insomnia. Keep a sleep diary to help you and your health care provider figure out what could be causing your insomnia. This information is not intended to replace advice given to you by your health care provider. Make sure you discuss any questions you have with your health care provider. Document Revised: 02/06/2021 Document Reviewed: 02/06/2021 Elsevier Patient Education  2024 [employer].

## 2023-05-20 NOTE — Progress Notes (Signed)
 @Patient  ID: Karen Duncan, female    DOB: 1975/04/10, 48 y.o.   MRN: [id number]  No chief complaint on file.   Referring provider: [name], DO  HPI: 48 year old female, never smoked.  Past medical history significant for hypertension, hypothyroidism, prediabetes, GERD, obesity.  Previous [facility] pulmonary encounter:  02/25/2023 Discussed the use of AI scribe software for clinical note transcription with the patient, who gave verbal consent to proceed.  History of Present Illness   The patient presents for a sleep consult due to difficulty maintaining sleep. She reports falling asleep easily within 5-10 minutes, but experiences frequent awakenings, approximately two to three times per night. These awakenings are often prolonged, making it difficult for the patient to return to sleep. The patient also reports significant snoring, to the extent that it has caused her spouse to sleep in a separate room.  The patient has not previously undergone a sleep study. She typically goes to bed around 10 PM and wakes up around 6:45 AM for work. 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 The patient denies smoking and reports only occasional social drinking, approximately one to two drinks per month.  Over the past two years, the patient has experienced weight gain of approximately 30 pounds and is currently seeking help for weight loss. She denies operating heavy machinery and has not used CPAP or oxygen.  The patient reports talking in her sleep and experiencing leg discomfort at night, which sometimes keeps her awake. She describes this discomfort as a twitching sensation. She has not previously taken any medication for these symptoms.  The patient had lab work done approximately three months ago, but it is unclear if iron levels were checked. She is open to having her iron levels checked due to the potential link between low iron and her leg symptoms.     Sleep questionnaire Symptoms- Snoring,  trouble staying asleep   Prior sleep study- none  Bedtime- 10pm  Time to fall asleep- 5-10 mins  Nocturnal awakenings- 2-3 times Out of bed/start of day- 6:45am  Weight changes- up 30lbs  Do you operate heavy machinery- no Do you currently wear CPAP- no Do you current wear oxygen- no   05/20/2023- Interim hx  Discussed the use of AI scribe software for clinical note transcription with the patient, who gave verbal consent to proceed.  History of Present Illness   The patient presents with difficulty maintaining sleep.  She experiences difficulty maintaining sleep, characterized by waking up approximately two to three times per night. She falls asleep easily within five to ten minutes but has prolonged awakenings that make it difficult to return to sleep.  A recent sleep study showed she took 26 minutes to fall asleep, with a sleep efficiency of 69%. The study was negative for sleep apnea, with an average of 3.3 apneic events per hour and a total of seven apneic events during the sleep duration. Her median oxygen level was normal, with a lowest recorded level of 89%. She did not sleep on her back during the study and experienced moderate snoring. She woke up during the study for about an hour between 2 and 3 AM and had trouble returning to sleep.  She is sleeping sperate from her husband due to her snoring. She has been trying to lose weight and has lost about 20 pounds, which she feels is helping her sleep better.  She has discussed jaw soreness upon waking with her regular dentist, which she attributes to stress and  possibly biting her jaw during sleep.      No Known Allergies  Immunization History  Administered Date(s) Administered   Influenza, Seasonal, Injecte, Preservative Fre 11/19/2022   Influenza,inj,Quad PF,6+ Mos 01/22/2013, 11/18/2015, 02/04/2018, 12/10/2019, 04/03/2021   PFIZER(Purple Top)SARS-COV-2 Vaccination 06/04/2019, 06/29/2019   Pfizer Covid-19 Vaccine Bivalent  Booster 74yrs & up 04/03/2021   Tdap 03/26/2013    Past Medical History:  Diagnosis Date   Anxiety    on meds   Back pain    Depression    on meds   Edema    GERD (gastroesophageal reflux disease)    Hypertension    on meds   Hypothyroidism    on meds   Joint pain    Pre-diabetes    Seasonal allergies     Tobacco History: Social History   Tobacco Use  Smoking Status Never  Smokeless Tobacco Never   Counseling given: Not Answered   Outpatient Medications Prior to Visit  Medication Sig Dispense Refill   Acetaminophen (TYLENOL 8 HOUR PO) Take by mouth.     Cholecalciferol (VITAMIN D3) 25 MCG (1000 UT) CAPS 50 mcg or 2,000 units daily     fluticasone (FLONASE) 50 MCG/ACT nasal spray Place 2 sprays into both nostrils daily. (Patient taking differently: Place 2 sprays into both nostrils daily as needed.) 16 g 6   levothyroxine (SYNTHROID) 125 MCG tablet Take 1 tablet by mouth every morning 30 minutes before food. 90 tablet 0   losartan (COZAAR) 50 MG tablet Take 1 tablet (50 mg total) by mouth at bedtime. 90 tablet 0   metFORMIN (GLUCOPHAGE) 500 MG tablet 1 po with lunch and dinner daily 70 tablet 0   Naproxen Sodium (ALEVE PO) Take by mouth.     norethindrone (ERRIN) 0.35 MG tablet Take 1 tablet by mouth daily. 84 tablet 1   sertraline (ZOLOFT) 100 MG tablet Take 1 tablet by mouth daily. Please schedule follow up visit for further refills. 90 tablet 1   Vitamin D, Ergocalciferol, (DRISDOL) 1.25 MG (50000 UNIT) CAPS capsule Take 1 capsule (50,000 Units total) by mouth every 7 (seven) days. 5 capsule 0   No facility-administered medications prior to visit.   Review of Systems  Review of Systems  Constitutional: Negative.   HENT: Negative.    Respiratory: Negative.    Cardiovascular: Negative.   Psychiatric/Behavioral:  Positive for sleep disturbance.    Physical Exam  There were no vitals taken for this visit. Physical Exam Constitutional:      General: She is  not in acute distress.    Appearance: Normal appearance. She is not ill-appearing.  HENT:     Head: Normocephalic and atraumatic.     Mouth/Throat:     Mouth: Mucous membranes are moist.     Pharynx: Oropharynx is clear.  Cardiovascular:     Rate and Rhythm: Normal rate and regular rhythm.  Pulmonary:     Effort: Pulmonary effort is normal.     Breath sounds: Normal breath sounds.  Skin:    General: Skin is warm and dry.  Neurological:     General: No focal deficit present.     Mental Status: She is alert and oriented to person, place, and time. Mental status is at baseline.  Psychiatric:        Mood and Affect: Mood normal.        Behavior: Behavior normal.        Thought Content: Thought content normal.  Judgment: Judgment normal.      Lab Results:  CBC    Component Value Date/Time   WBC 5.9 10/30/2022 1001   WBC 6.6 06/07/2013 0610   RBC 4.53 10/30/2022 1001   RBC 4.37 06/07/2013 0610   HGB 13.6 10/30/2022 1001   HGB 12.1 10/23/2012 0000   HCT 40.6 10/30/2022 1001   HCT 36 10/23/2012 0000   PLT 343 10/30/2022 1001   PLT 336 10/23/2012 0000   MCV 90 10/30/2022 1001   MCH 30.0 10/30/2022 1001   MCH 30.4 06/07/2013 0610   MCHC 33.5 10/30/2022 1001   MCHC 34.2 06/07/2013 0610   RDW 11.7 10/30/2022 1001   LYMPHSABS 2.0 10/30/2022 1001   MONOABS 0.7 10/23/2012 1402   EOSABS 0.1 10/30/2022 1001   BASOSABS 0.1 10/30/2022 1001    BMET    Component Value Date/Time   NA 138 04/16/2023 0941   K 4.5 04/16/2023 0941   CL 103 04/16/2023 0941   CO2 22 04/16/2023 0941   GLUCOSE 94 04/16/2023 0941   GLUCOSE 99 11/18/2015 1051   GLUCOSE 76 04/02/2013 1241   BUN 12 04/16/2023 0941   CREATININE 0.76 04/16/2023 0941   CREATININE 0.83 11/18/2015 1051   CALCIUM 10.2 04/16/2023 0941   GFRNONAA 95 12/10/2019 1111   GFRNONAA 89 11/18/2015 1051   GFRAA 109 12/10/2019 1111   GFRAA >89 11/18/2015 1051    BNP    Component Value Date/Time   BNP 7.4 04/03/2021 1611     ProBNP No results found for: "PROBNP"  Imaging: No results found.   Assessment & Duncan:   1. Loud snoring (Primary) - Ambulatory referral to Orthodontics     Sleep Maintenance Insomnia Difficulty maintaining sleep with frequent awakenings. Sleep study negative for obstructive sleep apnea but showed moderate snoring and occasional apneic events. Patient also reports jaw soreness upon waking. -Recommend trying over-the-counter melatonin (3-5mg ) to aid sleep. -Advise on sleep hygiene measures including maintaining a cool, dark sleep environment, avoiding back sleeping, and limiting bed activities to sleep only. -Consider cognitive behavioral therapy for sleep training if sleep disruptions persist. -Follow-up in 6 months or sooner if sleep disruptions persist.     Snoring -PSG 04/09/23 negative for OSA, AHI 3.3/hour  -Refer to orthodontic dentist, Dr. Irene Limbo, for consultation regarding an oral appliance to help with snoring and occasional apneas.  Glenford Bayley, NP 05/20/2023

## 2023-05-24 ENCOUNTER — Encounter: Payer: Self-pay | Admitting: Family Medicine

## 2023-05-24 ENCOUNTER — Ambulatory Visit: Payer: BC Managed Care – PPO | Admitting: Family Medicine

## 2023-05-24 VITALS — BP 110/80 | HR 89 | Wt 174.6 lb

## 2023-05-24 DIAGNOSIS — R0683 Snoring: Secondary | ICD-10-CM

## 2023-05-24 DIAGNOSIS — E038 Other specified hypothyroidism: Secondary | ICD-10-CM | POA: Diagnosis not present

## 2023-05-24 DIAGNOSIS — I158 Other secondary hypertension: Secondary | ICD-10-CM

## 2023-05-24 DIAGNOSIS — E66811 Obesity, class 1: Secondary | ICD-10-CM

## 2023-05-24 DIAGNOSIS — F32A Depression, unspecified: Secondary | ICD-10-CM

## 2023-05-24 NOTE — Assessment & Plan Note (Addendum)
 Continue weight loss efforts. Continue to follow with [facility], continue metformin. Sleep study negative for OSA, and unable to get GLP-1 covered by insurance. Elevated LDL on last check but ASCVD not at threshold for statin.

## 2023-05-24 NOTE — Patient Instructions (Signed)
It was great to see you!  Our plans for today:  - No changes to your medications. - Come back in 6 months for follow up.   Take care and seek immediate care sooner if you develop any concerns.   Dr. Linwood Dibbles

## 2023-05-24 NOTE — Assessment & Plan Note (Signed)
Doing well on current regimen, no changes made today. 

## 2023-05-24 NOTE — Progress Notes (Signed)
   SUBJECTIVE:   CHIEF COMPLAINT / HPI:   Hypertension: - Medications: losartan - Compliance: good - Checking BP at home: yes, 120s SBP - Denies any SOB, CP, vision changes, LE edema, medication SEs, or symptoms of hypotension  OBESITY - Meds: metformin - Complications of obesity: prediabetes, HTN - follows with [facility] clinic - no weight loss yet, just increased metformin last month  Hypothyroidism - Medications: Synthroid - Current symptoms:  none  Snoring, Insomnia - seen by Pulm last week. PSG negative for OSA. Referred to orthodontic dentist to see about oral appliance to help. Recommend consider CBT.  Depression - Medications: zoloft - Taking: good compliance - Counseling: not yet, planning to establish in April - doing well     05/24/2023    9:03 AM 11/19/2022    8:47 AM 02/15/2022    9:39 AM  Depression screen PHQ 2/9  Decreased Interest 0 1 0  Down, Depressed, Hopeless 0 1 0  PHQ - 2 Score 0 2 0  Altered sleeping 1 3 1   Tired, decreased energy 1 3 1   Change in appetite 0 0 0  Feeling bad or failure about yourself  0 1 0  Trouble concentrating 0 0 0  Moving slowly or fidgety/restless 0 0 0  Suicidal thoughts 0 0 0  PHQ-9 Score 2 9 2   Difficult doing work/chores Somewhat difficult  Somewhat difficult      OBJECTIVE:   BP 110/80   Pulse 89   Wt 174 lb 9.6 oz (79.2 kg)   SpO2 98%   BMI 31.93 kg/m   Gen: well appearing, in NAD Card: RRR Lungs: CTAB Ext: WWP, no edema   ASSESSMENT/PLAN:   Problem List Items Addressed This Visit       Cardiovascular and Mediastinum   Hypertension - Primary   Below goal. No orthostatic symptoms, no changes today. Reviewed recent labs.        Endocrine   Hypothyroidism   Doing well on current regimen, no changes made today.        Other   Depression   Doing well on current regimen, no changes made today.      Obesity (BMI 30.0-34.9)   Continue weight loss efforts. Continue to follow with [facility],  continue metformin. Sleep study negative for OSA, and unable to get GLP-1 covered by insurance.      Snoring      F/u 6 months.  [name], DO

## 2023-05-24 NOTE — Assessment & Plan Note (Addendum)
 Below goal. No orthostatic symptoms, no changes today. Reviewed recent labs.

## 2023-05-30 ENCOUNTER — Telehealth: Payer: Self-pay | Admitting: Primary Care

## 2023-05-30 NOTE — Telephone Encounter (Signed)
 Copy printed and ready to be faxed. This is placed in the fax folder in A pod.

## 2023-05-30 NOTE — Telephone Encounter (Signed)
 Karen Duncan office needs a copy of patients baseline diagnotic sleep study results. Fax number [phone]

## 2023-06-11 ENCOUNTER — Other Ambulatory Visit: Payer: Self-pay | Admitting: Family Medicine

## 2023-06-11 DIAGNOSIS — Z3009 Encounter for other general counseling and advice on contraception: Secondary | ICD-10-CM

## 2023-06-23 ENCOUNTER — Other Ambulatory Visit: Payer: Self-pay | Admitting: Family Medicine

## 2023-06-23 DIAGNOSIS — E039 Hypothyroidism, unspecified: Secondary | ICD-10-CM

## 2023-06-24 ENCOUNTER — Ambulatory Visit (INDEPENDENT_AMBULATORY_CARE_PROVIDER_SITE_OTHER): Admitting: Family Medicine

## 2023-07-22 ENCOUNTER — Other Ambulatory Visit: Payer: Self-pay | Admitting: Family Medicine

## 2023-07-22 DIAGNOSIS — I10 Essential (primary) hypertension: Secondary | ICD-10-CM

## 2023-08-07 ENCOUNTER — Encounter (INDEPENDENT_AMBULATORY_CARE_PROVIDER_SITE_OTHER): Payer: Self-pay

## 2023-11-28 ENCOUNTER — Other Ambulatory Visit: Payer: Self-pay | Admitting: Family Medicine

## 2023-11-28 DIAGNOSIS — Z3009 Encounter for other general counseling and advice on contraception: Secondary | ICD-10-CM

## 2023-12-03 ENCOUNTER — Other Ambulatory Visit: Payer: Self-pay | Admitting: Family Medicine

## 2023-12-03 DIAGNOSIS — F32A Depression, unspecified: Secondary | ICD-10-CM

## 2024-02-13 NOTE — Progress Notes (Signed)
 SUBJECTIVE:   CHIEF COMPLAINT / HPI:   Discussed the use of AI scribe software for clinical note transcription with the patient, who gave verbal consent to proceed.  History of Present Illness Karen Duncan is a 48 year old female who presents with persistent acid reflux symptoms and pelvic pain.  Gastroesophageal reflux symptoms - Daily heartburn for the past month, with pressure and excessive burping when lying on left side - Stomach acid reflux with sore throat and throat tickle at night - Symptoms worsen after eating and when lying down - One episode of food regurgitation - Several episodes of vomiting clear liquid - Partial but temporary relief with two 14-day courses of Prilosec, last taken over 30 days ago - No prior testing for H. pylori - Uses ibuprofen  about once a week for headaches - paternal aunt with h/o stomach cancer - some pain in LUQ, epigastric area.  Abdominal pain and cramping - Strong cramping pain in the lower abdomen recurring over the last three months - One severe episode last week after coffee, requiring her to lie on the floor - Pain not related to bowel movements or eating - No urinary symptoms or abnormal vaginal bleeding - Sexually active with new pain during intercourse, with worst cramps the morning after intercourse - no intermenstrual vaginal bleeding - on POPs for contraception, some missed doses. H /o irregular periods - LMP 01/26/24, periods every 3 weeks now, lasting longer than previous - denies menopausal symptoms  Rectal bleeding and hemorrhoids - Hemorrhoids with bright red bleeding during bowel movements - Bleeding seen on wiping and sometimes as drops in the toilet - No pain with BM or constipation - due for colonoscopy 05/2024, multiple polyps on previous colonoscopy 3 years ago - sister died from colon cancer in 55s.   Chronic pruritic rash - Red, itchy rash on back for three years - Multiple prescribed creams from  dermatology without improvement - Frequent scratching, especially at night - Partial relief with La Roche-Posay soap   OBJECTIVE:   BP 135/87   Pulse 88   Ht 5' 2 (1.575 m)   Wt 184 lb 9.6 oz (83.7 kg)   LMP 01/26/2024   SpO2 98%   BMI 33.76 kg/m   Gen: well appearing, in NAD Card: RRR Lungs: CTAB Abd: slightly TTP in LUQ. Exquisite TTP in LLQ, no rebound tenderness, no guarding. +BS. Non distended.  GYN:  External genitalia within normal limits.  Vaginal mucosa pink, moist, normal rugae.  Friable cervix however without obvious lesions.  No discharge noted on speculum exam.  Bimanual exam revealed normal, nongravid uterus though with pain in LLQ. Some cervical motion tenderness. No adnexal masses bilaterally.   Ext: WWP, no edema  Limited Transabdominal Ultrasound Findings: Bladder unremarkable. Nongravid uterus. L adnexa with small ?follicular cyst <2cm. Sonographic tenderness present. L adnexa with intact blood flow seen on doppler. Impression: Bladder, uterus unremarkable. Unable to visualize R ovary. L ovary with possible small follicular cyst with sonographic tenderness overlying.  ASSESSMENT/PLAN:   GERD (gastroesophageal reflux disease) Gastroesophageal reflux disease Chronic heartburn with regurgitation and vomiting, exacerbated by lying down and eating. Partial relief with OTC Prilosec. Differential includes H. pylori and esophageal stenosis. Lower concern for malignancy given no weight loss or loss of appetite though with +family history. - Ordered H. pylori test. - Prescribed prescription strength Prilosec. - Referred to GI for further evaluation, including possible endoscopy given family history, regurgitation, vomiting.  Pelvic pain 3 month history, exacerbated  by sexual activity and cramps. No urinary symptoms or irregular bleeding. Differential includes STI, PID, ovarian pathology. Limited transabdominal POCUS with ?small ovarian cyst though with maintained blood  supply. Upreg negative. Vaginal exam largely unrevealing. - Performed Pap smear and STI testing. - pelvic ultrasound  General counseling and advice on contraceptive management Continues on POPs. With regular periods, still needing contraception. Discussed natural progression to menopause and clinical diagnosis.  Hypertension At goal, no changes.  Internal hemorrhoids Seen on previous colonoscopy, with intermittent bleeding. No bowel issues. May continue to monitor. Referring to GI for above.  Tinea versicolor On upper back with hypopigmentation, intensely itchy. Rx ketaconazole. RTC if no better.  Prediabetes A1c today 5.7. needs refill of metformin , tolerating well.  Obesity (BMI 30.0-34.9) Continue weight loss efforts. Continue to follow with [facility], continue metformin . Sleep study negative for OSA, and unable to get GLP-1 covered by insurance. Elevated LDL on last check but ASCVD not at threshold for statin. Consider obtaining at follow up.    General health maintenance - Administered flu shot. - mammogram ordered  F/u 4 weeks.    Donald CHRISTELLA Lai, DO

## 2024-02-14 ENCOUNTER — Telehealth: Payer: Self-pay

## 2024-02-14 ENCOUNTER — Encounter: Payer: Self-pay | Admitting: Family Medicine

## 2024-02-14 ENCOUNTER — Ambulatory Visit: Admitting: Family Medicine

## 2024-02-14 ENCOUNTER — Other Ambulatory Visit (HOSPITAL_COMMUNITY)
Admission: RE | Admit: 2024-02-14 | Discharge: 2024-02-14 | Disposition: A | Discharge status: Home / Self Care | Source: Ambulatory Visit | Attending: Family Medicine | Admitting: Family Medicine

## 2024-02-14 VITALS — BP 135/87 | HR 88 | Ht 62.0 in | Wt 184.6 lb

## 2024-02-14 DIAGNOSIS — I158 Other secondary hypertension: Secondary | ICD-10-CM | POA: Diagnosis not present

## 2024-02-14 DIAGNOSIS — R1013 Epigastric pain: Secondary | ICD-10-CM | POA: Diagnosis not present

## 2024-02-14 DIAGNOSIS — E66811 Obesity, class 1: Secondary | ICD-10-CM

## 2024-02-14 DIAGNOSIS — Z800 Family history of malignant neoplasm of digestive organs: Secondary | ICD-10-CM

## 2024-02-14 DIAGNOSIS — K219 Gastro-esophageal reflux disease without esophagitis: Secondary | ICD-10-CM

## 2024-02-14 DIAGNOSIS — B360 Pityriasis versicolor: Secondary | ICD-10-CM | POA: Insufficient documentation

## 2024-02-14 DIAGNOSIS — Z23 Encounter for immunization: Secondary | ICD-10-CM | POA: Diagnosis not present

## 2024-02-14 DIAGNOSIS — K648 Other hemorrhoids: Secondary | ICD-10-CM | POA: Insufficient documentation

## 2024-02-14 DIAGNOSIS — R102 Pelvic and perineal pain unspecified side: Secondary | ICD-10-CM | POA: Diagnosis not present

## 2024-02-14 DIAGNOSIS — Z1231 Encounter for screening mammogram for malignant neoplasm of breast: Secondary | ICD-10-CM

## 2024-02-14 DIAGNOSIS — R7303 Prediabetes: Secondary | ICD-10-CM

## 2024-02-14 DIAGNOSIS — Z124 Encounter for screening for malignant neoplasm of cervix: Secondary | ICD-10-CM

## 2024-02-14 DIAGNOSIS — Z3009 Encounter for other general counseling and advice on contraception: Secondary | ICD-10-CM

## 2024-02-14 LAB — POCT URINE PREGNANCY: Preg Test, Ur: NEGATIVE

## 2024-02-14 LAB — POCT GLYCOSYLATED HEMOGLOBIN (HGB A1C): HbA1c, POC (prediabetic range): 5.7 % (ref 5.7–6.4)

## 2024-02-14 MED ORDER — KETOCONAZOLE 2 % EX CREA: 1.0000 | TOPICAL_CREAM | Freq: Every day | CUTANEOUS | 0 refills | Status: AC

## 2024-02-14 MED ORDER — METFORMIN HCL 500 MG PO TABS: ORAL_TABLET | ORAL | 0 refills | Status: DC

## 2024-02-14 MED ORDER — OMEPRAZOLE 40 MG PO CPDR: 40.0000 mg | DELAYED_RELEASE_CAPSULE | Freq: Every day | ORAL | 0 refills | Status: AC

## 2024-02-14 NOTE — Assessment & Plan Note (Signed)
 Continue weight loss efforts. Continue to follow with [facility], continue metformin . Sleep study negative for OSA, and unable to get GLP-1 covered by insurance. Elevated LDL on last check but ASCVD not at threshold for statin. Consider obtaining at follow up.

## 2024-02-14 NOTE — Assessment & Plan Note (Signed)
 At goal, no changes

## 2024-02-14 NOTE — Telephone Encounter (Signed)
 Called [facility] and spoke with D[place]ree while patient was in clinic to schedule US  for patient.  Was able to schedule patient for 02/27/2024 at 7:40am. Only prep is to come with a full bladder. Drink 32oz of water 1 hour before appointment.  Patient was made aware verbally and written on AVS during visit   Harlene Reiter, CMA

## 2024-02-14 NOTE — Assessment & Plan Note (Signed)
 Gastroesophageal reflux disease Chronic heartburn with regurgitation and vomiting, exacerbated by lying down and eating. Partial relief with OTC Prilosec. Differential includes H. pylori and esophageal stenosis. Lower concern for malignancy given no weight loss or loss of appetite though with +family history. - Ordered H. pylori test. - Prescribed prescription strength Prilosec. - Referred to GI for further evaluation, including possible endoscopy given family history, regurgitation, vomiting.

## 2024-02-14 NOTE — Assessment & Plan Note (Signed)
 Seen on previous colonoscopy, with intermittent bleeding. No bowel issues. May continue to monitor. Referring to GI for above.

## 2024-02-14 NOTE — Assessment & Plan Note (Signed)
 3 month history, exacerbated by sexual activity and cramps. No urinary symptoms or irregular bleeding. Differential includes STI, PID, ovarian pathology. Limited transabdominal POCUS with ?small ovarian cyst though with maintained blood supply. Upreg negative. Vaginal exam largely unrevealing. - Performed Pap smear and STI testing. - pelvic ultrasound

## 2024-02-14 NOTE — Patient Instructions (Addendum)
 It was great to see you!  Our plans for today:  - We are ordering an ultrasound for you. We will schedule this for you.  - Take the prilosec 40mg  daily. Let me know if this isn't helping.  - We are referring you to GI. Let us  know if you don't hear about an appointment in the next few weeks.  - Use the cream for your face and back. Let me know if this doesn't help.  - We ordered a screening mammogram for you today. You can call [facility] at [phone] to schedule this.   We are checking some labs today, we will release these results to your MyChart.  Take care and seek immediate care sooner if you develop any concerns.   Dr. Esli Jernigan

## 2024-02-14 NOTE — Assessment & Plan Note (Signed)
Continues on POPs. With regular periods, still needing contraception. Discussed natural progression to menopause and clinical diagnosis.

## 2024-02-14 NOTE — Assessment & Plan Note (Signed)
 On upper back with hypopigmentation, intensely itchy. Rx ketaconazole. RTC if no better.

## 2024-02-14 NOTE — Assessment & Plan Note (Signed)
 A1c today 5.7. needs refill of metformin , tolerating well.

## 2024-02-16 LAB — H. PYLORI BREATH TEST: H pylori Breath Test: POSITIVE — AB

## 2024-02-17 ENCOUNTER — Ambulatory Visit: Payer: Self-pay | Admitting: Family Medicine

## 2024-02-17 ENCOUNTER — Other Ambulatory Visit (HOSPITAL_COMMUNITY): Payer: Self-pay

## 2024-02-17 ENCOUNTER — Telehealth: Payer: Self-pay

## 2024-02-17 MED ORDER — BISMUTH/METRONIDAZ/TETRACYCLIN 140-125-125 MG PO CAPS: 3.0000 | ORAL_CAPSULE | Freq: Four times a day (QID) | ORAL | 0 refills | Status: DC

## 2024-02-17 NOTE — Telephone Encounter (Signed)
 PA pending.  Awaiting clinical questions to populate.

## 2024-02-18 ENCOUNTER — Other Ambulatory Visit (HOSPITAL_COMMUNITY): Payer: Self-pay

## 2024-02-18 NOTE — Telephone Encounter (Signed)
 Pharmacy Patient Advocate Encounter  Received notification from [facility] that Prior Authorization for Bismuth /Metronidaz/Tetracyclin 140-125-125MG  capsules has been CANCELLED due to Prior Authorization Not Required.  Attempted test claim however rejection still says:  VALUE PA REQUIRED FOR COVERAGE PRODUCT OR SERVICE NOT COVERED. PLEASE CONTACT PRESCRIBER.  Contacted insurance:  PA will need to be submitted on [employer] or via fax. Rep will send a fax form to [phone].

## 2024-02-19 ENCOUNTER — Other Ambulatory Visit (HOSPITAL_COMMUNITY): Payer: Self-pay

## 2024-02-19 LAB — CYTOLOGY - PAP
Chlamydia: NEGATIVE
Comment: NEGATIVE
Comment: NEGATIVE
Comment: NEGATIVE
Comment: NORMAL
Diagnosis: NEGATIVE
High risk HPV: NEGATIVE
Neisseria Gonorrhea: NEGATIVE
Trichomonas: NEGATIVE

## 2024-02-19 NOTE — Telephone Encounter (Signed)
 Pharmacy Patient Advocate Encounter   PA required; PA submitted to above mentioned insurance via Fax. Key/confirmation #/EOC [id number]. Status is pending.

## 2024-02-20 NOTE — Telephone Encounter (Signed)
 Pharmacy Patient Advocate Encounter  Received notification from [facility] that Prior Authorization for Bismuth /Metronidaz/Tetracyclin 140-125-125MG  capsules has been DENIED.  Full denial letter will be uploaded to the media tab. See denial reason below.    RX may have to be sent in separate parts.   PA #/Case ID/Reference #: [id number]

## 2024-02-21 ENCOUNTER — Other Ambulatory Visit: Payer: Self-pay | Admitting: Family Medicine

## 2024-02-21 DIAGNOSIS — Z3009 Encounter for other general counseling and advice on contraception: Secondary | ICD-10-CM

## 2024-02-21 MED ORDER — BISMUTH SUBSALICYLATE 525 MG PO TABS: 1.0000 | ORAL_TABLET | Freq: Four times a day (QID) | ORAL | 0 refills | Status: AC

## 2024-02-21 MED ORDER — TETRACYCLINE HCL 500 MG PO CAPS: 500.0000 mg | ORAL_CAPSULE | Freq: Four times a day (QID) | ORAL | 0 refills | Status: AC

## 2024-02-21 MED ORDER — METRONIDAZOLE 500 MG PO TABS: 500.0000 mg | ORAL_TABLET | Freq: Four times a day (QID) | ORAL | 0 refills | Status: AC

## 2024-02-27 ENCOUNTER — Inpatient Hospital Stay: Admission: RE | Admit: 2024-02-27 | Discharge: 2024-02-27 | Attending: Family Medicine

## 2024-02-27 ENCOUNTER — Other Ambulatory Visit: Payer: Self-pay | Admitting: Family Medicine

## 2024-02-27 DIAGNOSIS — R102 Pelvic and perineal pain unspecified side: Secondary | ICD-10-CM

## 2024-02-27 DIAGNOSIS — F32A Depression, unspecified: Secondary | ICD-10-CM

## 2024-02-27 NOTE — Telephone Encounter (Signed)
 Spoke with Dr. Delores regarding patient. Due to patient's current symptoms and risk factors, recommend ED evaluation.   Called patient and advised of ED recommendation.   Patient states that she will go to ED for evaluation.   She would still like to keep follow up visit for tomorrow morning. She will call back if she no longer needs appointment.   Chiquita JAYSON English, RN

## 2024-02-28 ENCOUNTER — Ambulatory Visit: Payer: Self-pay

## 2024-02-28 VITALS — BP 152/92 | HR 97 | Temp 98.9°F | Ht 62.0 in | Wt 186.6 lb

## 2024-02-28 DIAGNOSIS — R112 Nausea with vomiting, unspecified: Secondary | ICD-10-CM

## 2024-02-28 DIAGNOSIS — G43909 Migraine, unspecified, not intractable, without status migrainosus: Secondary | ICD-10-CM | POA: Diagnosis not present

## 2024-02-28 DIAGNOSIS — H539 Unspecified visual disturbance: Secondary | ICD-10-CM

## 2024-02-28 MED ORDER — PROCHLORPERAZINE EDISYLATE 10 MG/2ML IJ SOLN: 10.0000 mg | INTRAMUSCULAR | Status: DC

## 2024-02-28 MED ORDER — KETOROLAC TROMETHAMINE 30 MG/ML IJ SOLN
30.0000 mg | Freq: Once | INTRAMUSCULAR | Status: AC
  Administered 2024-02-28: 30 mg via INTRAMUSCULAR

## 2024-02-28 MED ORDER — DIPHENHYDRAMINE HCL 50 MG PO CAPS
50.0000 mg | ORAL_CAPSULE | Freq: Once | ORAL | Status: AC
  Administered 2024-02-28: 50 mg via ORAL

## 2024-02-28 MED ORDER — PROMETHAZINE HCL 25 MG/ML IJ SOLN: 10.0000 mg | Freq: Once | INTRAMUSCULAR | Status: DC

## 2024-02-28 MED ORDER — PROMETHAZINE HCL 25 MG/ML IJ SOLN
50.0000 mg | Freq: Once | INTRAMUSCULAR | Status: AC
  Administered 2024-02-28: 50 mg via INTRAMUSCULAR

## 2024-02-28 NOTE — Progress Notes (Signed)
" ° ° °  SUBJECTIVE:   CHIEF COMPLAINT / HPI:  Accompanied by husband who helps give history.  Sick symptoms Patient sent portal message 12/18 reporting painful headache, nausea with elevated BP to 150/90, blurry vision, tingling lips. Denied floaters or other vision changes.  Nurse phone call 12/18: Spoke with Dr. Delores regarding patient. Due to patient's current symptoms and risk factors, recommend ED evaluation.  Called patient and advised of ED recommendation.  Patient states that she will go to ED for evaluation.  She would still like to keep follow up visit for tomorrow morning. She will call back if she no longer needs appointment.  She did not present to the ED yesterday.  Today she tells me symptoms started suddenly 12/16 with severe headache, worse when she lowers head/looks at the ground. Noted left eyelid twitching, lip tingling. Tired, dizzy, weak. Blurring of vision only in left eye. Very sensitive to light. Started to feel a bit better yesterday afternoon so did not go to ED, but this morning feeling much worse and has vomited x3. No difficulty swallowing. No gait/balance disturbance or difficulty with word-finding.  Started H pylori treatment on Wednesday after symptoms started.  PERTINENT  PMH / PSH: Reviewed. HTN, hypothyroid, obesity  OBJECTIVE:   BP (!) 152/92   Pulse 97   Temp 98.9 F (37.2 C) (Oral)   Ht 5' 2 (1.575 m)   Wt 186 lb 9.6 oz (84.6 kg)   LMP 02/21/2024   SpO2 99%   BMI 34.13 kg/m   General: uncomfortable-appearing, no acute distress. HEENT: normocephalic, EOM intact, MMM. Blepharospasm noted. Does have difficulty keeping eyes open on exam due to light sensitivity. Posterior oropharynx without exudates or erythema. Neck musculature hypertonic and bilateral tender lymphadenopathy. Cardio: Regular rate, regular rhythm, no murmurs on exam. Pulm: Clear, no wheezing, no crackles. No increased work of breathing. Abdominal: bowel sounds present,  soft, non-tender, non-distended. No HSM. Extremities: No peripheral edema. Moves all extremities equally. Neuro: CN II: PERRL CN III, IV,VI: EOMI CV V: Normal sensation in V1, V2, V3 CVII: Symmetric smile and brow raise CN VIII: Normal hearing CN IX,X: Symmetric palate raise  CN XI: 5/5 shoulder shrug CN XII: Symmetric tongue protrusion  UE and LE strength 5/5 Normal sensation in UE and LE bilaterally  No ataxia with finger to nose, normal heel to shin. Negative Rhomberg. Psych:  Cognition and judgment appear intact. Alert, communicative, and cooperative.   Fluorescein eye exam without any evidence of abrasion or other abnormality.   ASSESSMENT/PLAN:   Assessment & Plan Migraine without status migrainosus, not intractable, unspecified migraine type Initial presentation was concerning for stroke versus other emergent etiology. Considered emergent ophthalmology consult versus ED for imaging to r/o CVA, optic neuritis, etc. Reassuringly, neuro exam normal and visual acuity exam showed decreased acuity bilaterally rather than only on the left. Fluorescein exam did not show abnormality. Most strongly suspect migraine. - migraine cocktail of benadryl , phenergan , toradol  administered in clinic with some improvement in headache reported in the first 10-15 minutes. This was very reassuring. - patient safe to return home with supportive care instructions and strict follow up instructions. She is in agreement. - close follow up scheduled in this clinic on Monday to reevaluate.   I appreciate Dr. McDiarmid's assistance in evaluating this patient and helping develop the plan of care.  Lauraine Norse, DO [facility] "

## 2024-02-28 NOTE — Patient Instructions (Signed)
 It was so good to see you today! Thank you for allowing me to take care of you.  Today we discussed the following concerns and plans:  Headache with vision changes - I suspect this is a migraine; we have given you medicines in the clinic which should help. They will make you sleepy. Please plan to go home and rest. - if these medicines do not help, if pain or vision worsen, or if you have any other concerns please go to the emergency department right away.  If you have any concerns, please call the clinic or schedule an appointment.  It was a pleasure to take care of you today. Be well!  Lauraine Norse, DO [facility], PGY-2  Do you need your medications delivered to your home?   Well send your prescription to the [facility] for delivery.          Address: [address] [place], [place], [place] [address]          Phone: [phone]  Please call the [facility] to speak with a pharmacist and set up your home medication delivery. If you have any questions, feel free to contact us  -- were happy to help!  Other [facility] that offer affordable prices on both prescriptions and over-the-counter items, as well as convenient services like vaccinations, are  [facility], at [facility]         Address:  [address], [place], [place] [address]         Phone: [phone]  [facility], located in the [facility]        Address: [address], [place], [place] [address]        Phone: [phone]  [facility], at [facility]       Address: [address], [place], [place] [address]       Phone: [phone]  [facility], at [facility]       Address: [address], [place], [place] [address]       Phone: [phone]

## 2024-03-02 ENCOUNTER — Ambulatory Visit

## 2024-03-02 VITALS — BP 140/89 | HR 89 | Ht 62.0 in | Wt 188.4 lb

## 2024-03-02 DIAGNOSIS — I10 Essential (primary) hypertension: Secondary | ICD-10-CM | POA: Diagnosis not present

## 2024-03-02 DIAGNOSIS — G43009 Migraine without aura, not intractable, without status migrainosus: Secondary | ICD-10-CM

## 2024-03-02 MED ORDER — KETOROLAC TROMETHAMINE 10 MG PO TABS: 10.0000 mg | ORAL_TABLET | Freq: Three times a day (TID) | ORAL | 0 refills | Status: AC | PRN

## 2024-03-02 NOTE — Assessment & Plan Note (Addendum)
 Encouraged compliance, FU visit scheduled. Since she is above goal, discussed continued monitoring vs adding medication, and she elected to continue to monitor.

## 2024-03-02 NOTE — Progress Notes (Signed)
" ° ° °  SUBJECTIVE:   CHIEF COMPLAINT / HPI:   Migraine - seen 12/19 with symptoms of headache, nausea, blurry vision, elevated BP - given benadryl , phenergan , Toradol  in clinic. States meds helped within 10 min - Improved after medications. Today has some mild frontal pain but no longer having vision flashes or nausea.  - Had HA in youth but never that bad  HTN BP Readings from Last 5 Encounters:  03/02/24 (!) 140/89  02/28/24 (!) 152/92  02/14/24 135/87  05/24/23 110/80  05/20/23 126/72  - states good compliance, has been elevated with her migraine    PERTINENT  PMH / PSH: HTN  OBJECTIVE:   BP (!) 140/89   Pulse 89   Ht 5' 2 (1.575 m)   Wt 188 lb 6.4 oz (85.5 kg)   LMP 02/21/2024   SpO2 97%   BMI 34.46 kg/m   Physical Exam   General: Alert, conversant, cooperative. No acute distress.  HEENT: PERRL. EOMI. MMM.  Cardiovascular: RRR Respiratory: Lungs CTAB. Normal work of breathing. Extremities: No cyanosis. No edema Musculoskeletal: No gross deformities.  Skin: Warm. Dry. No rashes. No icterus.  Neurologic: No focal deficits. Moving all extremities. Psychiatric: Cooperative. Appropriate mood. Appropriate affect.   ASSESSMENT/PLAN:   Assessment & Plan Primary hypertension Encouraged compliance, FU visit scheduled. Since she is above goal, discussed continued monitoring vs adding medication, and she elected to continue to monitor.  Migraine without aura and without status migrainosus, not intractable Overall improved, will give PRN PO Toradol  since it helped over the weekend. Discussed return precautions.      Milda LITTIE Deed, MD [facility] "

## 2024-03-02 NOTE — Patient Instructions (Signed)
 It was good to see you today.   Please bring ALL of your medications with you to every visit.    Today we talked about: Migraines- can use toradol  every 8 hours as needed. Call if needing to take for more than 2 days.     Thank you for choosing [facility]. Please refer to your mychart for specifics regarding today's visit or future appointments.

## 2024-03-11 ENCOUNTER — Other Ambulatory Visit: Payer: Self-pay | Admitting: Family Medicine

## 2024-03-11 DIAGNOSIS — R7303 Prediabetes: Secondary | ICD-10-CM

## 2024-03-19 ENCOUNTER — Ambulatory Visit
Admission: RE | Admit: 2024-03-19 | Discharge: 2024-03-19 | Disposition: A | Discharge status: Home / Self Care | Source: Ambulatory Visit | Attending: Family Medicine | Admitting: Family Medicine

## 2024-03-19 DIAGNOSIS — Z1231 Encounter for screening mammogram for malignant neoplasm of breast: Secondary | ICD-10-CM

## 2024-04-13 ENCOUNTER — Ambulatory Visit: Admitting: Family Medicine
